# Patient Record
Sex: Female | Born: 1975 | Race: Black or African American | Hispanic: No | Marital: Single | Smoking: Current every day smoker
Health system: Southern US, Community
[De-identification: ages and names within clinical notes are randomized; demographics above are authoritative.]

## PROBLEM LIST (undated history)

## (undated) ENCOUNTER — Inpatient Hospital Stay (HOSPITAL_COMMUNITY): Payer: Self-pay

## (undated) DIAGNOSIS — I10 Essential (primary) hypertension: Secondary | ICD-10-CM

## (undated) DIAGNOSIS — G43019 Migraine without aura, intractable, without status migrainosus: Secondary | ICD-10-CM

## (undated) DIAGNOSIS — D573 Sickle-cell trait: Secondary | ICD-10-CM

## (undated) DIAGNOSIS — D649 Anemia, unspecified: Secondary | ICD-10-CM

## (undated) DIAGNOSIS — O039 Complete or unspecified spontaneous abortion without complication: Secondary | ICD-10-CM

## (undated) DIAGNOSIS — K219 Gastro-esophageal reflux disease without esophagitis: Secondary | ICD-10-CM

## (undated) DIAGNOSIS — I509 Heart failure, unspecified: Secondary | ICD-10-CM

## (undated) HISTORY — DX: Migraine without aura, intractable, without status migrainosus: G43.019

## (undated) HISTORY — PX: DILATION AND CURETTAGE OF UTERUS: SHX78

## (undated) HISTORY — PX: BREAST SURGERY: SHX581

## (undated) HISTORY — DX: Sickle-cell trait: D57.3

---

## 1997-10-26 ENCOUNTER — Emergency Department (HOSPITAL_COMMUNITY): Admission: EM | Admit: 1997-10-26 | Discharge: 1997-10-26 | Payer: Self-pay | Admitting: Emergency Medicine

## 1997-11-02 ENCOUNTER — Inpatient Hospital Stay (HOSPITAL_COMMUNITY): Admission: EM | Admit: 1997-11-02 | Discharge: 1997-11-02 | Payer: Self-pay | Admitting: Emergency Medicine

## 1997-11-09 ENCOUNTER — Ambulatory Visit (HOSPITAL_COMMUNITY): Admission: RE | Admit: 1997-11-09 | Discharge: 1997-11-09 | Payer: Self-pay | Admitting: *Deleted

## 1997-11-10 ENCOUNTER — Ambulatory Visit (HOSPITAL_COMMUNITY): Admission: RE | Admit: 1997-11-10 | Discharge: 1997-11-10 | Payer: Self-pay | Admitting: Obstetrics

## 1997-11-12 ENCOUNTER — Ambulatory Visit (HOSPITAL_COMMUNITY): Admission: RE | Admit: 1997-11-12 | Discharge: 1997-11-12 | Payer: Self-pay | Admitting: *Deleted

## 1997-11-16 ENCOUNTER — Ambulatory Visit (HOSPITAL_COMMUNITY): Admission: AD | Admit: 1997-11-16 | Discharge: 1997-11-16 | Payer: Self-pay | Admitting: *Deleted

## 2000-02-15 ENCOUNTER — Ambulatory Visit (HOSPITAL_COMMUNITY): Admission: RE | Admit: 2000-02-15 | Discharge: 2000-02-15 | Payer: Self-pay | Admitting: *Deleted

## 2000-06-04 ENCOUNTER — Ambulatory Visit (HOSPITAL_COMMUNITY): Admission: RE | Admit: 2000-06-04 | Discharge: 2000-06-04 | Payer: Self-pay | Admitting: *Deleted

## 2000-06-17 ENCOUNTER — Inpatient Hospital Stay (HOSPITAL_COMMUNITY): Admission: AD | Admit: 2000-06-17 | Discharge: 2000-06-17 | Payer: Self-pay | Admitting: *Deleted

## 2000-06-25 ENCOUNTER — Ambulatory Visit (HOSPITAL_COMMUNITY): Admission: RE | Admit: 2000-06-25 | Discharge: 2000-06-25 | Payer: Self-pay | Admitting: Obstetrics

## 2000-07-16 ENCOUNTER — Inpatient Hospital Stay (HOSPITAL_COMMUNITY): Admission: AD | Admit: 2000-07-16 | Discharge: 2000-07-19 | Payer: Self-pay | Admitting: *Deleted

## 2000-07-16 ENCOUNTER — Inpatient Hospital Stay (HOSPITAL_COMMUNITY): Admission: AD | Admit: 2000-07-16 | Discharge: 2000-07-16 | Payer: Self-pay | Admitting: Obstetrics & Gynecology

## 2001-01-08 ENCOUNTER — Encounter: Admission: RE | Admit: 2001-01-08 | Discharge: 2001-01-08 | Payer: Self-pay | Admitting: General Surgery

## 2001-01-08 ENCOUNTER — Encounter (HOSPITAL_BASED_OUTPATIENT_CLINIC_OR_DEPARTMENT_OTHER): Payer: Self-pay | Admitting: General Surgery

## 2001-04-14 ENCOUNTER — Emergency Department (HOSPITAL_COMMUNITY): Admission: EM | Admit: 2001-04-14 | Discharge: 2001-04-14 | Payer: Self-pay | Admitting: *Deleted

## 2001-04-14 ENCOUNTER — Encounter: Payer: Self-pay | Admitting: Emergency Medicine

## 2003-09-11 ENCOUNTER — Emergency Department (HOSPITAL_COMMUNITY): Admission: EM | Admit: 2003-09-11 | Discharge: 2003-09-11 | Payer: Self-pay | Admitting: Emergency Medicine

## 2005-02-02 ENCOUNTER — Emergency Department (HOSPITAL_COMMUNITY): Admission: EM | Admit: 2005-02-02 | Discharge: 2005-02-02 | Payer: Self-pay | Admitting: Emergency Medicine

## 2005-03-10 ENCOUNTER — Emergency Department (HOSPITAL_COMMUNITY): Admission: EM | Admit: 2005-03-10 | Discharge: 2005-03-10 | Payer: Self-pay | Admitting: Emergency Medicine

## 2005-05-18 ENCOUNTER — Encounter: Admission: RE | Admit: 2005-05-18 | Discharge: 2005-05-18 | Payer: Self-pay | Admitting: Obstetrics and Gynecology

## 2008-11-10 ENCOUNTER — Inpatient Hospital Stay (HOSPITAL_COMMUNITY): Admission: EM | Admit: 2008-11-10 | Discharge: 2008-11-11 | Payer: Self-pay | Admitting: Emergency Medicine

## 2009-08-29 ENCOUNTER — Emergency Department (HOSPITAL_COMMUNITY): Admission: EM | Admit: 2009-08-29 | Discharge: 2009-08-30 | Payer: Self-pay | Admitting: Emergency Medicine

## 2010-01-10 ENCOUNTER — Emergency Department (HOSPITAL_COMMUNITY): Admission: EM | Admit: 2010-01-10 | Discharge: 2010-01-10 | Payer: Self-pay | Admitting: Emergency Medicine

## 2010-02-07 ENCOUNTER — Inpatient Hospital Stay (HOSPITAL_COMMUNITY): Admission: AD | Admit: 2010-02-07 | Discharge: 2010-02-07 | Payer: Self-pay | Admitting: Obstetrics & Gynecology

## 2010-02-07 ENCOUNTER — Ambulatory Visit: Payer: Self-pay | Admitting: Family

## 2010-09-16 LAB — RAPID URINE DRUG SCREEN, HOSP PERFORMED
Amphetamines: NOT DETECTED
Barbiturates: NOT DETECTED
Benzodiazepines: NOT DETECTED
Cocaine: NOT DETECTED
Opiates: NOT DETECTED
Tetrahydrocannabinol: POSITIVE — AB

## 2010-09-16 LAB — CBC
HCT: 33.6 % — ABNORMAL LOW (ref 36.0–46.0)
Hemoglobin: 11.4 g/dL — ABNORMAL LOW (ref 12.0–15.0)
MCH: 30.3 pg (ref 26.0–34.0)
MCHC: 34 g/dL (ref 30.0–36.0)
MCV: 89.3 fL (ref 78.0–100.0)
Platelets: 194 10*3/uL (ref 150–400)
RBC: 3.77 MIL/uL — ABNORMAL LOW (ref 3.87–5.11)
RDW: 14.6 % (ref 11.5–15.5)
WBC: 8.3 10*3/uL (ref 4.0–10.5)

## 2010-09-16 LAB — URINE MICROSCOPIC-ADD ON

## 2010-09-16 LAB — URINALYSIS, ROUTINE W REFLEX MICROSCOPIC
Bilirubin Urine: NEGATIVE
Glucose, UA: NEGATIVE mg/dL
Ketones, ur: NEGATIVE mg/dL
Leukocytes, UA: NEGATIVE
Nitrite: NEGATIVE
Protein, ur: NEGATIVE mg/dL
Specific Gravity, Urine: 1.01 (ref 1.005–1.030)
Urobilinogen, UA: 0.2 mg/dL (ref 0.0–1.0)
pH: 6 (ref 5.0–8.0)

## 2010-09-16 LAB — WET PREP, GENITAL
Trich, Wet Prep: NONE SEEN
Yeast Wet Prep HPF POC: NONE SEEN

## 2010-09-16 LAB — GC/CHLAMYDIA PROBE AMP, GENITAL
Chlamydia, DNA Probe: NEGATIVE
GC Probe Amp, Genital: NEGATIVE

## 2010-09-18 LAB — URINALYSIS, ROUTINE W REFLEX MICROSCOPIC
Bilirubin Urine: NEGATIVE
Glucose, UA: NEGATIVE mg/dL
Hgb urine dipstick: NEGATIVE
Ketones, ur: NEGATIVE mg/dL
Nitrite: NEGATIVE
Protein, ur: NEGATIVE mg/dL
Specific Gravity, Urine: 1.02 (ref 1.005–1.030)
Urobilinogen, UA: 0.2 mg/dL (ref 0.0–1.0)
pH: 6 (ref 5.0–8.0)

## 2010-09-18 LAB — URINE MICROSCOPIC-ADD ON

## 2010-09-18 LAB — DIFFERENTIAL
Basophils Absolute: 0 10*3/uL (ref 0.0–0.1)
Basophils Relative: 0 % (ref 0–1)
Eosinophils Absolute: 0.1 10*3/uL (ref 0.0–0.7)
Eosinophils Relative: 1 % (ref 0–5)
Lymphocytes Relative: 23 % (ref 12–46)
Lymphs Abs: 1.9 10*3/uL (ref 0.7–4.0)
Monocytes Absolute: 0.1 10*3/uL (ref 0.1–1.0)
Monocytes Relative: 1 % — ABNORMAL LOW (ref 3–12)
Neutro Abs: 6.3 10*3/uL (ref 1.7–7.7)
Neutrophils Relative %: 75 % (ref 43–77)

## 2010-09-18 LAB — CBC
HCT: 36 % (ref 36.0–46.0)
Hemoglobin: 12.4 g/dL (ref 12.0–15.0)
MCH: 29.9 pg (ref 26.0–34.0)
MCHC: 34.5 g/dL (ref 30.0–36.0)
MCV: 86.8 fL (ref 78.0–100.0)
Platelets: 261 10*3/uL (ref 150–400)
RBC: 4.15 MIL/uL (ref 3.87–5.11)
RDW: 14.1 % (ref 11.5–15.5)
WBC: 8.5 10*3/uL (ref 4.0–10.5)

## 2010-09-18 LAB — WET PREP, GENITAL: Yeast Wet Prep HPF POC: NONE SEEN

## 2010-09-18 LAB — URINE CULTURE
Colony Count: NO GROWTH
Culture: NO GROWTH

## 2010-09-18 LAB — ABO/RH: ABO/RH(D): O POS

## 2010-09-18 LAB — HCG, QUANTITATIVE, PREGNANCY: hCG, Beta Chain, Quant, S: 58933 m[IU]/mL — ABNORMAL HIGH (ref <5)

## 2010-09-18 LAB — GC/CHLAMYDIA PROBE AMP, GENITAL
Chlamydia, DNA Probe: NEGATIVE
GC Probe Amp, Genital: NEGATIVE

## 2010-09-18 LAB — POCT PREGNANCY, URINE: Preg Test, Ur: POSITIVE

## 2010-09-21 LAB — WET PREP, GENITAL
Clue Cells Wet Prep HPF POC: NONE SEEN
WBC, Wet Prep HPF POC: NONE SEEN
Yeast Wet Prep HPF POC: NONE SEEN

## 2010-09-21 LAB — COMPREHENSIVE METABOLIC PANEL
ALT: 17 U/L (ref 0–35)
AST: 22 U/L (ref 0–37)
Albumin: 3.3 g/dL — ABNORMAL LOW (ref 3.5–5.2)
Alkaline Phosphatase: 62 U/L (ref 39–117)
BUN: 16 mg/dL (ref 6–23)
CO2: 25 mEq/L (ref 19–32)
Calcium: 8.1 mg/dL — ABNORMAL LOW (ref 8.4–10.5)
Chloride: 110 mEq/L (ref 96–112)
Creatinine, Ser: 0.69 mg/dL (ref 0.4–1.2)
GFR calc Af Amer: >60 mL/min (ref >60)
GFR calc non Af Amer: >60 mL/min (ref >60)
Glucose, Bld: 109 mg/dL — ABNORMAL HIGH (ref 70–99)
Potassium: 3.8 mEq/L (ref 3.5–5.1)
Sodium: 141 mEq/L (ref 135–145)
Total Bilirubin: 0.4 mg/dL (ref 0.3–1.2)
Total Protein: 6 g/dL (ref 6.0–8.3)

## 2010-09-21 LAB — RAPID URINE DRUG SCREEN, HOSP PERFORMED
Amphetamines: NOT DETECTED
Barbiturates: NOT DETECTED
Benzodiazepines: NOT DETECTED
Cocaine: NOT DETECTED
Opiates: NOT DETECTED
Tetrahydrocannabinol: POSITIVE — AB

## 2010-09-21 LAB — URINALYSIS, ROUTINE W REFLEX MICROSCOPIC
Bilirubin Urine: NEGATIVE
Glucose, UA: NEGATIVE mg/dL
Ketones, ur: 15 mg/dL — AB
Nitrite: NEGATIVE
Protein, ur: NEGATIVE mg/dL
Specific Gravity, Urine: 1.022 (ref 1.005–1.030)
Urobilinogen, UA: 1 mg/dL (ref 0.0–1.0)
pH: 6 (ref 5.0–8.0)

## 2010-09-21 LAB — URINE MICROSCOPIC-ADD ON

## 2010-09-21 LAB — CBC
HCT: 36.5 % (ref 36.0–46.0)
Hemoglobin: 12.4 g/dL (ref 12.0–15.0)
MCHC: 33.9 g/dL (ref 30.0–36.0)
MCV: 90.7 fL (ref 78.0–100.0)
Platelets: 209 10*3/uL (ref 150–400)
RBC: 4.03 MIL/uL (ref 3.87–5.11)
RDW: 13.8 % (ref 11.5–15.5)
WBC: 7.2 10*3/uL (ref 4.0–10.5)

## 2010-09-21 LAB — ETHANOL: Alcohol, Ethyl (B): <5 mg/dL (ref 0–10)

## 2010-09-21 LAB — GC/CHLAMYDIA PROBE AMP, GENITAL
Chlamydia, DNA Probe: NEGATIVE
GC Probe Amp, Genital: NEGATIVE

## 2010-09-21 LAB — LIPASE, BLOOD: Lipase: 28 U/L (ref 11–59)

## 2010-09-21 LAB — DIFFERENTIAL
Basophils Absolute: 0 10*3/uL (ref 0.0–0.1)
Basophils Relative: 1 % (ref 0–1)
Eosinophils Absolute: 0.2 10*3/uL (ref 0.0–0.7)
Eosinophils Relative: 2 % (ref 0–5)
Lymphocytes Relative: 40 % (ref 12–46)
Lymphs Abs: 2.8 10*3/uL (ref 0.7–4.0)
Monocytes Absolute: 0.3 10*3/uL (ref 0.1–1.0)
Monocytes Relative: 4 % (ref 3–12)
Neutro Abs: 3.8 10*3/uL (ref 1.7–7.7)
Neutrophils Relative %: 54 % (ref 43–77)

## 2010-09-21 LAB — URINE CULTURE
Colony Count: NO GROWTH
Culture: NO GROWTH

## 2010-09-21 LAB — POCT PREGNANCY, URINE: Preg Test, Ur: NEGATIVE

## 2010-10-11 LAB — COMPREHENSIVE METABOLIC PANEL
ALT: 17 U/L (ref 0–35)
ALT: 21 U/L (ref 0–35)
AST: 22 U/L (ref 0–37)
AST: 31 U/L (ref 0–37)
Albumin: 3.1 g/dL — ABNORMAL LOW (ref 3.5–5.2)
Albumin: 3.9 g/dL (ref 3.5–5.2)
Alkaline Phosphatase: 59 U/L (ref 39–117)
Alkaline Phosphatase: 67 U/L (ref 39–117)
BUN: 3 mg/dL — ABNORMAL LOW (ref 6–23)
BUN: 5 mg/dL — ABNORMAL LOW (ref 6–23)
CO2: 22 mEq/L (ref 19–32)
CO2: 26 mEq/L (ref 19–32)
Calcium: 7.8 mg/dL — ABNORMAL LOW (ref 8.4–10.5)
Calcium: 9.5 mg/dL (ref 8.4–10.5)
Chloride: 105 mEq/L (ref 96–112)
Chloride: 114 mEq/L — ABNORMAL HIGH (ref 96–112)
Creatinine, Ser: 0.59 mg/dL (ref 0.4–1.2)
Creatinine, Ser: 0.72 mg/dL (ref 0.4–1.2)
GFR calc Af Amer: >60 mL/min (ref >60)
GFR calc Af Amer: >60 mL/min (ref >60)
GFR calc non Af Amer: >60 mL/min (ref >60)
GFR calc non Af Amer: >60 mL/min (ref >60)
Glucose, Bld: 73 mg/dL (ref 70–99)
Glucose, Bld: 89 mg/dL (ref 70–99)
Potassium: 3.9 mEq/L (ref 3.5–5.1)
Potassium: 4.4 mEq/L (ref 3.5–5.1)
Sodium: 138 mEq/L (ref 135–145)
Sodium: 142 mEq/L (ref 135–145)
Total Bilirubin: 0.6 mg/dL (ref 0.3–1.2)
Total Bilirubin: 0.9 mg/dL (ref 0.3–1.2)
Total Protein: 5.3 g/dL — ABNORMAL LOW (ref 6.0–8.3)
Total Protein: 6.6 g/dL (ref 6.0–8.3)

## 2010-10-11 LAB — URINE MICROSCOPIC-ADD ON

## 2010-10-11 LAB — DIFFERENTIAL
Basophils Absolute: 0 10*3/uL (ref 0.0–0.1)
Basophils Relative: 0 % (ref 0–1)
Eosinophils Absolute: 0.1 10*3/uL (ref 0.0–0.7)
Eosinophils Relative: 1 % (ref 0–5)
Lymphocytes Relative: 31 % (ref 12–46)
Lymphs Abs: 3.3 10*3/uL (ref 0.7–4.0)
Monocytes Absolute: 0.6 10*3/uL (ref 0.1–1.0)
Monocytes Relative: 5 % (ref 3–12)
Neutro Abs: 6.8 10*3/uL (ref 1.7–7.7)
Neutrophils Relative %: 63 % (ref 43–77)

## 2010-10-11 LAB — ETHANOL
Alcohol, Ethyl (B): 126 mg/dL — ABNORMAL HIGH (ref 0–10)
Alcohol, Ethyl (B): 323 mg/dL — ABNORMAL HIGH (ref 0–10)

## 2010-10-11 LAB — CBC
HCT: 33.1 % — ABNORMAL LOW (ref 36.0–46.0)
HCT: 39.2 % (ref 36.0–46.0)
HCT: 43.6 % (ref 36.0–46.0)
Hemoglobin: 11.6 g/dL — ABNORMAL LOW (ref 12.0–15.0)
Hemoglobin: 13.5 g/dL (ref 12.0–15.0)
Hemoglobin: 15.1 g/dL — ABNORMAL HIGH (ref 12.0–15.0)
MCHC: 34.4 g/dL (ref 30.0–36.0)
MCHC: 34.7 g/dL (ref 30.0–36.0)
MCHC: 34.9 g/dL (ref 30.0–36.0)
MCV: 88.4 fL (ref 78.0–100.0)
MCV: 90.1 fL (ref 78.0–100.0)
MCV: 90.2 fL (ref 78.0–100.0)
Platelets: 183 10*3/uL (ref 150–400)
Platelets: 206 10*3/uL (ref 150–400)
Platelets: 247 10*3/uL (ref 150–400)
RBC: 3.75 MIL/uL — ABNORMAL LOW (ref 3.87–5.11)
RBC: 4.34 MIL/uL (ref 3.87–5.11)
RBC: 4.84 MIL/uL (ref 3.87–5.11)
RDW: 14.1 % (ref 11.5–15.5)
RDW: 14.4 % (ref 11.5–15.5)
RDW: 14.6 % (ref 11.5–15.5)
WBC: 10.8 10*3/uL — ABNORMAL HIGH (ref 4.0–10.5)
WBC: 6.9 10*3/uL (ref 4.0–10.5)
WBC: 7.2 10*3/uL (ref 4.0–10.5)

## 2010-10-11 LAB — RAPID URINE DRUG SCREEN, HOSP PERFORMED
Amphetamines: NOT DETECTED
Barbiturates: NOT DETECTED
Benzodiazepines: NOT DETECTED
Cocaine: NOT DETECTED
Opiates: NOT DETECTED
Tetrahydrocannabinol: POSITIVE — AB

## 2010-10-11 LAB — URINALYSIS, ROUTINE W REFLEX MICROSCOPIC
Bilirubin Urine: NEGATIVE
Glucose, UA: NEGATIVE mg/dL
Hgb urine dipstick: NEGATIVE
Ketones, ur: NEGATIVE mg/dL
Nitrite: NEGATIVE
Protein, ur: NEGATIVE mg/dL
Specific Gravity, Urine: 1.003 — ABNORMAL LOW (ref 1.005–1.030)
Urobilinogen, UA: 0.2 mg/dL (ref 0.0–1.0)
pH: 6 (ref 5.0–8.0)

## 2010-10-11 LAB — BASIC METABOLIC PANEL
BUN: 6 mg/dL (ref 6–23)
CO2: 22 mEq/L (ref 19–32)
Calcium: 9.6 mg/dL (ref 8.4–10.5)
Chloride: 109 mEq/L (ref 96–112)
Creatinine, Ser: 0.59 mg/dL (ref 0.4–1.2)
GFR calc Af Amer: >60 mL/min (ref >60)
GFR calc non Af Amer: >60 mL/min (ref >60)
Glucose, Bld: 95 mg/dL (ref 70–99)
Potassium: 3.1 mEq/L — ABNORMAL LOW (ref 3.5–5.1)
Sodium: 142 mEq/L (ref 135–145)

## 2010-10-11 LAB — MAGNESIUM: Magnesium: 2.3 mg/dL (ref 1.5–2.5)

## 2010-10-11 LAB — HCG, QUANTITATIVE, PREGNANCY
hCG, Beta Chain, Quant, S: 384 m[IU]/mL — ABNORMAL HIGH (ref <5)
hCG, Beta Chain, Quant, S: 523 m[IU]/mL — ABNORMAL HIGH (ref <5)

## 2010-10-11 LAB — PREGNANCY, URINE: Preg Test, Ur: POSITIVE

## 2010-11-15 NOTE — H&P (Signed)
NAMEVANILLA, HEATHERINGTON               ACCOUNT NO.:  [id number]   MEDICAL RECORD NO.:  [id number]          PATIENT TYPE:  INP   LOCATION:  1517                         FACILITY:  [facility]   PHYSICIAN:  Oswald Hillock, MD        DATE OF BIRTH:  [birth year]   DATE OF ADMISSION:  11/09/2008  DATE OF DISCHARGE:                              HISTORY & PHYSICAL   CHIEF COMPLAINT:  Altered mental status.   HISTORY OF PRESENT ILLNESS:  The patient is a 35 year old African  American female who presented to the ER via EMS with acute alcohol  intoxication.  The patient was initially unable to provide any history  but, at that the time of this interview, did state that she has been  drinking on a regular daily basis and smokes weed in addition to that.  She was currently hallucinating upon arrival and received a dose of  Ativan by the ER physician.  Subsequent evaluation revealed that the  patient had a positive pregnancy test and was noted to have an alcohol  level of 323 in addition to being hypokalemic.   PAST MEDICAL HISTORY:  1. Alcohol abuse.  2. Polysubstance abuse.  3. History of first trimester abortion.   PAST SURGICAL HISTORY:  None.   CURRENT MEDICATIONS:  None.   ALLERGIES:  No known drug allergies.   FAMILY HISTORY:  No history of hypertension, coronary artery disease or  diabetes in the family.   SOCIAL HISTORY:  The patient drinks alcohol daily, smokes weed in  addition to cigarettes about a pack per day.  Denies any other drug use.  No history of IV drug abuse.   REVIEW OF SYSTEMS:  Difficult to obtain given the patient's altered  status; however, she denies any recent history of fever, rigors, chills,  chest pain, shortness of breath or any urinary symptoms.  She states  that her last menstrual cycle was about three weeks back.  She is  sexually active and does not use any contraception.   PHYSICAL EXAMINATION:  VITALS:  On admission pulse 118, blood pressure  162/16,  respiratory rate of 22, temperature 99.2, O2 saturations of 98%  on room air.  GENERAL:  The patient is somnolent, arousable after being stimulated  multiple times, alert, oriented to person and place but not time.  HEENT:  No scleral icterus.  No pallor.  Ears:  Negative.  Poor oral  dental hygiene.  NECK:  Supple.  No lymphadenopathy.  No JVD.  No carotid bruits.  CHEST:  CTA bilaterally.  CVS:  S1, S2 plus.  Regular, tachycardic.  No gallop, rub or murmur  appreciated.  ABDOMEN:  Soft, nontender, nondistended, bowel sounds present.  EXTREMITIES:  No cyanosis, clubbing or edema.  NEUROLOGIC:  Cranial nerves II-XII grossly intact.  The patient moves  all four extremities.   LABORATORY DATA:  CT scan of the head revealed no acute infiltrate or  abnormality.  Chest x-ray showed low lung volumes.  Sodium 142,  potassium 2.1, chloride 109, CO2 22, glucose 95, BUN 6, creatinine 0.59,  calcium 9.6.  WBC count  10.8, hemoglobin 15.1, hematocrit 42.6, platelet  count 247, MCV 90.  Her alcohol level was 323.  Urinalysis showed small  leukocyte esterase, magnesium level was 2.3.  Urine pregnancy was  positive.   IMPRESSION AND PLAN:  This is the case of a 35 year old [place] American  female who presents with acute alcohol intoxication who has a positive  urine pregnancy test.  1. Alcohol intoxication.  We will admit her for overnight observation.      The patient has already received thiamine and folate in the ER.      continue that on a daily basis.  She will be put on Ativan for deep      venous thrombosis prophylaxis as well as for seizures but use will      be restricted to evaluation by the MD prior to administering given      risk of fetal toxicity with benzodiazepine use.  2. Positive pregnancy test.  The patient's last menstrual cycle was      about three weeks back.  She is sexually active and does not use      any contraception based on her account at the time of this       interview.  We will get the serum pregnancy test to confirm her      pregnancy and may need to consult OB/GYN prior to her discharge.  3. Hypokalemia.  We will supplement with KCl and IV fluids. We will      monitor.  4. Deep venous thrombosis prophylaxis.  No indication for deep venous      thrombosis prophylaxis at  this time.      Oswald Hillock, MD  Electronically Signed     BA/MEDQ  D:  11/10/2008  T:  11/10/2008  Job:  [id number]   cc:   [facility]

## 2010-11-15 NOTE — Discharge Summary (Signed)
NAMECARLETA, [name]               ACCOUNT NO.:  [id number]   MEDICAL RECORD NO.:  [id number]          PATIENT TYPE:  INP   LOCATION:  1517                         FACILITY:  [facility]   PHYSICIAN:  Hillery Aldo, M.D.   DATE OF BIRTH:  [birth year]   DATE OF ADMISSION:  11/09/2008  DATE OF DISCHARGE:  11/11/2008                               DISCHARGE SUMMARY   PRIMARY CARE PHYSICIAN:  None.   DISCHARGE DIAGNOSES:  1. Toxic encephalopathy secondary to alcohol intoxication.  2. Alcohol dependency.  3. Hypertension.  4. Abdominal pain likely due to a right ovarian cyst.  5. 3-5 weeks pregnancy.  6. Marijuana abuse   DISCHARGE MEDICATIONS:  1. Hydralazine 10 mg p.o. q.i.d.  2. Prenatal vitamin.   CONSULTATIONS:  Dr. Jeanie Sewer of psychiatry.   BRIEF ADMISSION HISTORY OF PRESENT ILLNESS:  The patient is a 35-year-  old female who presented to the hospital acutely intoxicated with  alcohol.  The patient apparently was significantly altered and  hallucinating upon arrival.  She had a CAT scan of her head that was  reportedly normal and a chest x-ray, and subsequently was noted to be  pregnant.  The patient did not have any former knowledge that she was  pregnant nor had she had been receiving any regular medical care.  She  was admitted for further evaluation and detoxification.  For full  details, please see the dictated report done by Dr. Arley Phenix.   PROCEDURES AND DIAGNOSTIC STUDIES:  1. Chest x-ray on Nov 09, 2008 showed low lung volumes with no acute      findings.  2. Chest x-ray on Nov 09, 2008 showed no acute intracranial      abnormality.  3. OB ultrasound on October 12, 2008 showed an early intrauterine      pregnancy estimated a 5 weeks 1 day by gestational sac size.  No      visible embryo.  Probable involuted right ovarian cyst.  No other      acute or significant findings.   DISCHARGE LABORATORY VALUES:  Beta HCG was 523.  Sodium was 138,  potassium 4.4, chloride 105,  bicarb 26, BUN 5, creatinine 0.72, glucose  89, calcium 9.5.  Liver function studies were within normal limits.  White blood cell count was 6.9, hemoglobin 13.5, hematocrit 39.2,  platelets 206,000.   HOSPITAL COURSE BY PROBLEM:  1. Toxic encephalopathy secondary to alcohol intoxication/alcohol      dependency:  The patient was admitted and provided with Ativan on      p.r.n. basis.  She was supplemented with thiamine and folic acid      and counseled on the importance of avoiding alcohol, especially      during pregnancy.  The patient seemed motivated to pursue help for      her substance abuse issues.  At this point, she is awaiting      psychiatric consultation and will be discharged once cleared by      psychiatry.  She is no longer exhibiting any hallucinations or      other signs of altered mental status.  2. Hypertension:  The patient was put on hydralazine.  She will be      discharged on hydralazine as this is safe to use during pregnancy.      She will need close followup.  3. Abdominal pain secondary to a right ovarian cyst:  The patient did      have an extensive evaluation.  An ultrasound was obtained which      showed the cyst but no other source of her abdominal pain.      Abdominal pain has improved.  4. A 5-week pregnancy:  The patient has a 5-week 1-day pregnancy by      ultrasound criteria.  She was put on a prenatal vitamin.  We will      have social work attempt to get her hospital followup with a local      OB/GYN.  5. Marijuana Abuse: The patient was counseled.   DISPOSITION:  The patient is medically stable for discharge.  We are  awaiting formal psychiatry consultation and social work help for  discharge planning.   Time spent coordinating care for discharge and discharge instructions  equals 35 minutes.      Hillery Aldo, M.D.  Electronically Signed     CR/MEDQ  D:  11/11/2008  T:  11/11/2008  Job:  [id number]

## 2010-11-15 NOTE — Consult Note (Signed)
NAMEKALSEY, LULL               ACCOUNT NO.:  [id number]   MEDICAL RECORD NO.:  [id number]          PATIENT TYPE:  INP   LOCATION:  1517                         FACILITY:  [facility]   PHYSICIAN:  Antonietta Breach, M.D.  DATE OF BIRTH:  1975/09/20   DATE OF CONSULTATION:  11/11/2008  DATE OF DISCHARGE:                                 CONSULTATION   REASON FOR CONSULTATION:  Alcohol dependence, rule out depression in a  young pregnant female.   HISTORY OF PRESENT ILLNESS:  Rylynne Schicker is a 35 year old female  admitted to the [facility] on the Nov 09, 2008, for alcohol  intoxication in the context of pregnancy.   Ms. Kelner discovered that she was pregnant upon her presentation to the  [facility].  It is estimated that she is at 3-[redacted] weeks  gestation.   She states that during her heaviest periods of alcohol use, she has been  consuming up to a fifth per day.  She states that over the past week,  she has not been drinking every day.   She has normal interests and constructive future goals.  She has been  processing the news of her pregnancy.  She has been talking with her  close friends.  She very much wants to keep the baby.  She acknowledges  that she is still recovering from the surprise but is determined to do  everything that is necessary to be a good mother for the baby   The father of the baby has gotten the news.  She realizes that he is  ambivalent about having any role in support and she is approaching the  future as if he will provide no support.   She already has a single parent of an 3-year-old and she anticipates  continuing to be a single parent.   She does have a church support and she has 2 very close supportive  friends.   Regarding her alcohol use, she is determined to maintain sobriety and  wants standard tools to help her maintain sobriety.   When she initially arrived to the [facility], she was  hallucinating.  She did receive  some Ativan in the emergency room.  All  hallucinations have resolved.  Her orientation and memory function are  intact.  She is cooperative and socially appropriate.   PAST PSYCHIATRIC HISTORY:  No history of major depression.  No history  of elevated energy or decreased need for sleep.  No suicide attempts.  She denies she denies any history of mental treatment.  She does  acknowledge that she has an alcohol problem.   FAMILY PSYCHIATRIC HISTORY:  None known.   SOCIAL HISTORY:  Please see the above.  She does have a history of  smoking marijuana.  She has been smoking tobacco cigarettes at a rate of  about a pack per day.   PAST MEDICAL HISTORY:  Estimated [redacted] weeks gestation pregnancy.  Her beta  hCG quantitative was 523.   SGOT 31, SGPT 21.  Her alcohol level upon presentation to the emergency  room was 323.  The  admitting physician noted that she was somnolent but  arousable at the time of the exam.   MEDICATIONS:  She is on folic acid 1 mg daily and thiamine 100 mg daily.   ALLERGIES:  None known.  mental status exam she is not displaying a  tremor.   REVIEW OF SYSTEMS:  Noncontributory.   PHYSICAL EXAMINATION:  VITAL SIGNS:  Temperature 98.5, pulse 90,  respiratory rate 20, blood pressure 143/91, O2 saturation on room air  98%.   MENTAL STATUS EXAM:  She is not displaying a tremor.  Ms. Zaugg is  alert.  Her eye contact is good.  Her affect is broad and appropriate.  Mood is within normal limits.  Concentration within normal limits.  She  is oriented to all spheres.  Her memory is intact to immediate, recent  and remote.  Her fund of knowledge and intelligence are normal.  Speech  is normal.  Thought process logical, coherent, goal-directed.  No  looseness of associations.  Thought content:  No thoughts of harming  herself or others. No delusions or hallucinations.  Insight is intact.  Judgment is intact.   ASSESSMENT:  Axis I:  Alcohol dependence.  Cannabis abuse.   Axis II:  Deferred.  Axis III:  Early pregnancy.  Axis IV:  Primary support group.  Axis V:  55.   Ms. Sayed is not at risk to harm herself or others.  She agrees to call  emergency services immediately for any thoughts of harming herself,  thoughts of harming others or distress.   The undersigned provided ego supportive psychotherapy.   The 12-step method was discussed.  Also [facility] was discussed.   Ms. Andujo wants to attend [facility] with an appointment and discuss  further [facility] treatment from there.   She wants to attend 12-step groups and have 12-step materials.   She states that she does not want to attend a residential chemical  dependence program at this time, although the undersigned adamantly  recommends that.   She explains that she needs to be home with her other child.   Would ask the social worker to help Ms. Blanchette set of a [facility]  appointment as well as provide 12-step materials and information on  groups.      Antonietta Breach, M.D.  Electronically Signed     JW/MEDQ  D:  11/11/2008  T:  11/11/2008  Job:  [id number]

## 2011-02-07 ENCOUNTER — Emergency Department (HOSPITAL_COMMUNITY)
Admission: EM | Admit: 2011-02-07 | Discharge: 2011-02-07 | Disposition: A | Discharge status: Home / Self Care | Payer: Self-pay | Attending: Emergency Medicine | Admitting: Emergency Medicine

## 2011-02-07 DIAGNOSIS — T63461A Toxic effect of venom of wasps, accidental (unintentional), initial encounter: Secondary | ICD-10-CM | POA: Insufficient documentation

## 2011-02-07 DIAGNOSIS — T6391XA Toxic effect of contact with unspecified venomous animal, accidental (unintentional), initial encounter: Secondary | ICD-10-CM | POA: Insufficient documentation

## 2011-03-11 ENCOUNTER — Emergency Department (HOSPITAL_COMMUNITY)
Admission: EM | Admit: 2011-03-11 | Discharge: 2011-03-11 | Disposition: A | Discharge status: Home / Self Care | Payer: Self-pay | Attending: Emergency Medicine | Admitting: Emergency Medicine

## 2011-03-11 ENCOUNTER — Emergency Department (HOSPITAL_COMMUNITY): Payer: Self-pay

## 2011-03-11 DIAGNOSIS — K219 Gastro-esophageal reflux disease without esophagitis: Secondary | ICD-10-CM | POA: Insufficient documentation

## 2011-03-11 DIAGNOSIS — I10 Essential (primary) hypertension: Secondary | ICD-10-CM | POA: Insufficient documentation

## 2011-03-11 DIAGNOSIS — R079 Chest pain, unspecified: Secondary | ICD-10-CM | POA: Insufficient documentation

## 2011-03-11 DIAGNOSIS — L509 Urticaria, unspecified: Secondary | ICD-10-CM | POA: Insufficient documentation

## 2011-03-11 LAB — CBC
HCT: 37.1 % (ref 36.0–46.0)
Hemoglobin: 13.1 g/dL (ref 12.0–15.0)
MCH: 29 pg (ref 26.0–34.0)
MCHC: 35.3 g/dL (ref 30.0–36.0)
MCV: 82.1 fL (ref 78.0–100.0)
Platelets: 192 10*3/uL (ref 150–400)
RBC: 4.52 MIL/uL (ref 3.87–5.11)
RDW: 13.8 % (ref 11.5–15.5)
WBC: 6.4 10*3/uL (ref 4.0–10.5)

## 2011-03-11 LAB — COMPREHENSIVE METABOLIC PANEL
ALT: 13 U/L (ref 0–35)
AST: 16 U/L (ref 0–37)
Albumin: 3.8 g/dL (ref 3.5–5.2)
Alkaline Phosphatase: 60 U/L (ref 39–117)
BUN: 11 mg/dL (ref 6–23)
CO2: 21 mEq/L (ref 19–32)
Calcium: 9.6 mg/dL (ref 8.4–10.5)
Chloride: 107 mEq/L (ref 96–112)
Creatinine, Ser: 0.67 mg/dL (ref 0.50–1.10)
GFR calc Af Amer: >60 mL/min (ref >60)
GFR calc non Af Amer: >60 mL/min (ref >60)
Glucose, Bld: 96 mg/dL (ref 70–99)
Potassium: 3.8 mEq/L (ref 3.5–5.1)
Sodium: 139 mEq/L (ref 135–145)
Total Bilirubin: 0.4 mg/dL (ref 0.3–1.2)
Total Protein: 6.9 g/dL (ref 6.0–8.3)

## 2011-03-11 LAB — URINE MICROSCOPIC-ADD ON

## 2011-03-11 LAB — URINALYSIS, ROUTINE W REFLEX MICROSCOPIC
Bilirubin Urine: NEGATIVE
Glucose, UA: NEGATIVE mg/dL
Hgb urine dipstick: NEGATIVE
Ketones, ur: NEGATIVE mg/dL
Nitrite: NEGATIVE
Protein, ur: NEGATIVE mg/dL
Specific Gravity, Urine: 1.009 (ref 1.005–1.030)
Urobilinogen, UA: 0.2 mg/dL (ref 0.0–1.0)
pH: 8 (ref 5.0–8.0)

## 2011-03-11 LAB — DIFFERENTIAL
Basophils Absolute: 0 10*3/uL (ref 0.0–0.1)
Basophils Relative: 1 % (ref 0–1)
Eosinophils Absolute: 0.1 10*3/uL (ref 0.0–0.7)
Eosinophils Relative: 1 % (ref 0–5)
Lymphocytes Relative: 28 % (ref 12–46)
Lymphs Abs: 1.8 10*3/uL (ref 0.7–4.0)
Monocytes Absolute: 0.6 10*3/uL (ref 0.1–1.0)
Monocytes Relative: 9 % (ref 3–12)
Neutro Abs: 4 10*3/uL (ref 1.7–7.7)
Neutrophils Relative %: 62 % (ref 43–77)

## 2011-03-11 LAB — POCT I-STAT TROPONIN I: Troponin i, poc: 0 ng/mL (ref 0.00–0.08)

## 2011-03-11 LAB — POCT PREGNANCY, URINE: Preg Test, Ur: NEGATIVE

## 2011-03-11 LAB — LIPASE, BLOOD: Lipase: 32 U/L (ref 11–59)

## 2012-01-13 ENCOUNTER — Inpatient Hospital Stay (HOSPITAL_COMMUNITY)
Admission: EM | Admit: 2012-01-13 | Discharge: 2012-01-13 | Disposition: A | Discharge status: Home / Self Care | Payer: Medicaid Other | Attending: Obstetrics and Gynecology | Admitting: Obstetrics and Gynecology

## 2012-01-13 ENCOUNTER — Emergency Department (HOSPITAL_COMMUNITY): Payer: Medicaid Other

## 2012-01-13 ENCOUNTER — Encounter (HOSPITAL_COMMUNITY): Payer: Self-pay | Admitting: Emergency Medicine

## 2012-01-13 DIAGNOSIS — R109 Unspecified abdominal pain: Secondary | ICD-10-CM | POA: Insufficient documentation

## 2012-01-13 DIAGNOSIS — Z3490 Encounter for supervision of normal pregnancy, unspecified, unspecified trimester: Secondary | ICD-10-CM

## 2012-01-13 DIAGNOSIS — N949 Unspecified condition associated with female genital organs and menstrual cycle: Secondary | ICD-10-CM | POA: Insufficient documentation

## 2012-01-13 DIAGNOSIS — R102 Pelvic and perineal pain: Secondary | ICD-10-CM

## 2012-01-13 DIAGNOSIS — Z331 Pregnant state, incidental: Secondary | ICD-10-CM | POA: Insufficient documentation

## 2012-01-13 DIAGNOSIS — N938 Other specified abnormal uterine and vaginal bleeding: Secondary | ICD-10-CM | POA: Insufficient documentation

## 2012-01-13 DIAGNOSIS — N939 Abnormal uterine and vaginal bleeding, unspecified: Secondary | ICD-10-CM

## 2012-01-13 HISTORY — DX: Essential (primary) hypertension: I10

## 2012-01-13 HISTORY — DX: Gastro-esophageal reflux disease without esophagitis: K21.9

## 2012-01-13 LAB — URINALYSIS, ROUTINE W REFLEX MICROSCOPIC
Bilirubin Urine: NEGATIVE
Bilirubin Urine: NEGATIVE
Glucose, UA: NEGATIVE mg/dL
Glucose, UA: NEGATIVE mg/dL
Hgb urine dipstick: NEGATIVE
Ketones, ur: NEGATIVE mg/dL
Ketones, ur: NEGATIVE mg/dL
Nitrite: NEGATIVE
Nitrite: NEGATIVE
Protein, ur: NEGATIVE mg/dL
Protein, ur: NEGATIVE mg/dL
Specific Gravity, Urine: 1.01 (ref 1.005–1.030)
Specific Gravity, Urine: 1.011 (ref 1.005–1.030)
Urobilinogen, UA: 0.2 mg/dL (ref 0.0–1.0)
Urobilinogen, UA: 0.2 mg/dL (ref 0.0–1.0)
pH: 6.5 (ref 5.0–8.0)
pH: 6 (ref 5.0–8.0)

## 2012-01-13 LAB — CBC
HCT: 35.1 % — ABNORMAL LOW (ref 36.0–46.0)
Hemoglobin: 12.3 g/dL (ref 12.0–15.0)
MCH: 28.7 pg (ref 26.0–34.0)
MCHC: 35 g/dL (ref 30.0–36.0)
MCV: 81.8 fL (ref 78.0–100.0)
Platelets: 267 10*3/uL (ref 150–400)
RBC: 4.29 MIL/uL (ref 3.87–5.11)
RDW: 14.8 % (ref 11.5–15.5)
WBC: 11.1 10*3/uL — ABNORMAL HIGH (ref 4.0–10.5)

## 2012-01-13 LAB — HCG, QUANTITATIVE, PREGNANCY: hCG, Beta Chain, Quant, S: 7601 m[IU]/mL — ABNORMAL HIGH (ref <5)

## 2012-01-13 LAB — BASIC METABOLIC PANEL
BUN: 9 mg/dL (ref 6–23)
CO2: 17 mEq/L — ABNORMAL LOW (ref 19–32)
Calcium: 9.6 mg/dL (ref 8.4–10.5)
Chloride: 100 mEq/L (ref 96–112)
Creatinine, Ser: 0.55 mg/dL (ref 0.50–1.10)
GFR calc Af Amer: >90 mL/min (ref >90)
GFR calc non Af Amer: >90 mL/min (ref >90)
Glucose, Bld: 86 mg/dL (ref 70–99)
Potassium: 3.9 mEq/L (ref 3.5–5.1)
Sodium: 134 mEq/L — ABNORMAL LOW (ref 135–145)

## 2012-01-13 LAB — URINE MICROSCOPIC-ADD ON

## 2012-01-13 LAB — RPR: RPR Ser Ql: NONREACTIVE

## 2012-01-13 LAB — WET PREP, GENITAL
Trich, Wet Prep: NONE SEEN
Yeast Wet Prep HPF POC: NONE SEEN

## 2012-01-13 LAB — POCT PREGNANCY, URINE: Preg Test, Ur: POSITIVE — AB

## 2012-01-13 LAB — HIV ANTIBODY (ROUTINE TESTING W REFLEX): HIV: NONREACTIVE

## 2012-01-13 MED ORDER — MORPHINE SULFATE 4 MG/ML IJ SOLN
4.0000 mg | Freq: Once | INTRAMUSCULAR | Status: AC
  Administered 2012-01-13: 4 mg via INTRAVENOUS
  Filled 2012-01-13: qty 1

## 2012-01-13 MED ORDER — KETOROLAC TROMETHAMINE 30 MG/ML IJ SOLN
30.0000 mg | Freq: Once | INTRAMUSCULAR | Status: AC
  Administered 2012-01-13: 30 mg via INTRAVENOUS
  Filled 2012-01-13: qty 1

## 2012-01-13 MED ORDER — OXYCODONE-ACETAMINOPHEN 5-325 MG PO TABS: 1.0000 | ORAL_TABLET | ORAL | Status: AC | PRN

## 2012-01-13 MED ORDER — ONDANSETRON HCL 4 MG/2ML IJ SOLN: 4.0000 mg | INTRAMUSCULAR | Status: DC | PRN

## 2012-01-13 MED ORDER — HYDROCHLOROTHIAZIDE 25 MG PO TABS: 25.0000 mg | ORAL_TABLET | Freq: Once | ORAL | Status: DC

## 2012-01-13 MED ORDER — HYDROMORPHONE HCL PF 1 MG/ML IJ SOLN
1.0000 mg | Freq: Once | INTRAMUSCULAR | Status: AC
  Administered 2012-01-13: 1 mg via INTRAVENOUS
  Filled 2012-01-13: qty 1

## 2012-01-13 MED ORDER — ONDANSETRON HCL 4 MG/2ML IJ SOLN
4.0000 mg | INTRAMUSCULAR | Status: AC | PRN
  Administered 2012-01-13 (×2): 4 mg via INTRAVENOUS
  Filled 2012-01-13 (×2): qty 2

## 2012-01-13 MED ORDER — HYDROMORPHONE HCL PF 1 MG/ML IJ SOLN
1.0000 mg | Freq: Once | INTRAMUSCULAR | Status: AC
  Administered 2012-01-13: 1 mg via INTRAVENOUS

## 2012-01-13 MED ORDER — HYDROCHLOROTHIAZIDE 25 MG PO TABS
25.0000 mg | ORAL_TABLET | Freq: Once | ORAL | Status: AC
  Administered 2012-01-13: 25 mg via ORAL
  Filled 2012-01-13: qty 1

## 2012-01-13 MED ORDER — LABETALOL HCL 5 MG/ML IV SOLN: 5.0000 mg | Freq: Once | INTRAVENOUS | Status: DC

## 2012-01-13 MED ORDER — HYDROMORPHONE HCL PF 1 MG/ML IJ SOLN
INTRAMUSCULAR | Status: AC
  Filled 2012-01-13: qty 1

## 2012-01-13 NOTE — Progress Notes (Signed)
Notified of B/P 196/94. B/P Rx ordered  For pt to start . Pt to f/u in office in 2 days.

## 2012-01-13 NOTE — Progress Notes (Addendum)
Pt feeling better after toradol.    BP 195/101 HR  52  A/P:  Discussed US findings w/ pt, including IUP and CLC.  Rec f/u in office Monday for rpt [place] and recheck. Plan for discharge with Rx for percocet & restart HCTZ

## 2012-01-13 NOTE — ED Provider Notes (Signed)
History     CSN: [id number]  Arrival date & time 01/13/12  6213   First MD Initiated Contact with Patient 01/13/12 1011      Chief Complaint  Patient presents with  . Abdominal Pain    (Consider location/radiation/quality/duration/timing/severity/associated sxs/prior treatment) HPI Comments: Pt is a 36 yo female G4P1 with 3 miscarries presents to the ER with the cc of abdominal pain. Onset of symptoms began 2 weeks ago, location suprapubic, severity 10/10, radiation to left lower quadrant and CVA .  Associated symptoms include vaginal bleeding. LMP onset June 24th with heavy bleeding that changed to light flow and now  Blood clots.  The patient is currently sexually active and uses condoms occasionally.  she denies fevers, night sweats, chills, nausea, vomiting, vaginal discharge, urinary frequency or dysuria, lightheadedness, syncope, shortness of breath, difficulty breathing.  Patient has no other complaints this time.   The history is provided by the patient.    Past Medical History  Diagnosis Date  . Hypertension   . GERD (gastroesophageal reflux disease)     Past Surgical History  Procedure Date  . Breast surgery   . Dilation and curettage of uterus     four miscarriages    No family history on file.  History  Substance Use Topics  . Smoking status: Current Everyday Smoker -- 0.2 packs/day  . Smokeless tobacco: Never Used  . Alcohol Use:      monthly beer and liquor    OB History    Grav Para Term Preterm Abortions TAB SAB Ect Mult Living                  Review of Systems  Constitutional: Positive for activity change. Negative for fever, chills and appetite change.  HENT: Negative for congestion.   Eyes: Negative for visual disturbance.  Respiratory: Negative for shortness of breath.   Cardiovascular: Negative for chest pain and leg swelling.  Gastrointestinal: Positive for nausea and abdominal pain. Negative for vomiting, constipation and abdominal  distention.  Genitourinary: Positive for vaginal bleeding and pelvic pain. Negative for dysuria, urgency and frequency.  Neurological: Negative for dizziness, syncope, weakness, light-headedness, numbness and headaches.  Psychiatric/Behavioral: Negative for confusion.  All other systems reviewed and are negative.    Allergies  Review of patient's allergies indicates no known allergies.  Home Medications  No current outpatient prescriptions on file.  BP 160/82  Pulse 68  Temp 98.3 F (36.8 C) (Oral)  Resp 18  SpO2 98%  LMP 12/25/2011  Physical Exam  Constitutional: She is oriented to person, place, and time. She appears well-developed and well-nourished. She appears distressed.  HENT:  Head: Normocephalic and atraumatic.  Mouth/Throat: Oropharynx is clear and moist. No oropharyngeal exudate.  Eyes: Conjunctivae and EOM are normal. Pupils are equal, round, and reactive to light. No scleral icterus.  Neck: Normal range of motion. Neck supple. No tracheal deviation present. No thyromegaly present.  Cardiovascular: Normal rate, regular rhythm, normal heart sounds and intact distal pulses.   Pulmonary/Chest: Effort normal and breath sounds normal. No stridor. No respiratory distress. She has no wheezes.  Abdominal: Soft. There is tenderness.         Bowl sounds present. Diffuse abdominal tenderness with severe localization to the suprapubic area.   Genitourinary:       Exam performed by Jaci Carrel,  exam chaperoned Date: 01/13/2012 Pelvic exam: normal external genitalia without evidence of trauma. CERVIX: Unable to visualize os d/t bleeding; vaginal discharge - bloody, Wet  prep and DNA probe for chlamydia and GC obtained.   ADNEXA: normal adnexa in size, tender bilaterally, no palpable mass    Musculoskeletal: Normal range of motion. She exhibits no edema and no tenderness.  Neurological: She is alert and oriented to person, place, and time. Coordination normal.  Skin: Skin  is warm and dry. No rash noted. She is not diaphoretic. No erythema. No pallor.  Psychiatric: She has a normal mood and affect. Her behavior is normal.    ED Course  Procedures (including critical care time)  Labs Reviewed  CBC - Abnormal; Notable for the following:    WBC 11.1 (*)     HCT 35.1 (*)     All other components within normal limits  WET PREP, GENITAL - Abnormal; Notable for the following:    Clue Cells Wet Prep HPF POC MODERATE (*)     WBC, Wet Prep HPF POC FEW (*)     All other components within normal limits  URINALYSIS, ROUTINE W REFLEX MICROSCOPIC - Abnormal; Notable for the following:    Hgb urine dipstick LARGE (*)     Leukocytes, UA SMALL (*)     All other components within normal limits  BASIC METABOLIC PANEL - Abnormal; Notable for the following:    Sodium 134 (*)     CO2 17 (*)     All other components within normal limits  POCT PREGNANCY, URINE - Abnormal; Notable for the following:    Preg Test, Ur POSITIVE (*)     All other components within normal limits  HCG, QUANTITATIVE, PREGNANCY - Abnormal; Notable for the following:    hCG, Beta Chain, Quant, S 7601 (*)     All other components within normal limits  RPR  URINE MICROSCOPIC-ADD ON  GC/CHLAMYDIA PROBE AMP, GENITAL  HIV ANTIBODY (ROUTINE TESTING)   US Ob Comp Less 14 Wks  01/13/2012  *RADIOLOGY REPORT*  Clinical Data: Left-sided pelvic pain.  3-week gestational age by LMP.  OBSTETRIC <14 WK [place] AND TRANSVAGINAL OB US  Technique:  Both transabdominal and transvaginal ultrasound examinations were performed for complete evaluation of the gestation as well as the maternal uterus, adnexal regions, and pelvic cul-de-sac.  Transvaginal technique was performed to assess early pregnancy.  Comparison:  None.  Intrauterine gestational sac:  Visualized/normal in shape. Yolk sac: Visualized Embryo: Visualized Cardiac Activity: Visualized Heart Rate: 115 bpm  CRL: 6  mm  6 w  2 d          [place] EDC: 09/15/2012  Maternal  uterus/adnexae: Small left ovarian corpus luteum cyst noted.  Normal right ovary. No adnexal mass or free fluid identified.  IMPRESSION:  1.  Single living IUP measuring 6 weeks 2 days with [place] EDC of 09/15/2012. 2.  No maternal uterine or adnexal abnormality identified.  Original Report Authenticated By: Danae Orleans, M.D.   US Ob Transvaginal  01/13/2012  *RADIOLOGY REPORT*  Clinical Data: Left-sided pelvic pain.  3-week gestational age by LMP.  OBSTETRIC <14 WK [place] AND TRANSVAGINAL OB US  Technique:  Both transabdominal and transvaginal ultrasound examinations were performed for complete evaluation of the gestation as well as the maternal uterus, adnexal regions, and pelvic cul-de-sac.  Transvaginal technique was performed to assess early pregnancy.  Comparison:  None.  Intrauterine gestational sac:  Visualized/normal in shape. Yolk sac: Visualized Embryo: Visualized Cardiac Activity: Visualized Heart Rate: 115 bpm  CRL: 6  mm  6 w  2 d  [place] EDC: 09/15/2012  Maternal uterus/adnexae: Small left ovarian corpus luteum cyst noted.  Normal right ovary. No adnexal mass or free fluid identified.  IMPRESSION:  1.  Single living IUP measuring 6 weeks 2 days with [place] EDC of 09/15/2012. 2.  No maternal uterine or adnexal abnormality identified.  Original Report Authenticated By: Danae Orleans, M.D.     No diagnosis found.  Component     Latest Ref Rng 01/10/2010  ABO/RH(D)      [facility] POS    MDM  Suprapubic abdominal pain Vaginal bleeding Pregnancy   The patient appears reasonably stabilized for transfer & admission to [facility] considering the current resources, flow, and capabilities available in the ED at this time, and I doubt any other [facility] requiring further screening and/or treatment in the ED prior to admission.   Consult: Dr. Renaldo Fiddler to accept pt at [facility], PA-C 01/13/12 1551

## 2012-01-13 NOTE — ED Notes (Signed)
[name] notified of pain and increase BP.

## 2012-01-13 NOTE — ED Notes (Signed)
[place] is with patient.

## 2012-01-13 NOTE — Progress Notes (Signed)
36 yo G5P1031 presents w/ c/o vaginal bleeding & pelvic cramping that began 2 weeks ago.  Pt notes sx have worsened over the past 3-4 days.  Denies lightheadedness or dizziness.  Pain unrelieved w/ IV narcotics at [facility].  PMHx:  HTN (no meds or medical care x 2 yrs), GERD PSHx:  D&C, benign breast bx SHx:  + tobacco, no ivdu, occ etoh ObHx:  SVD x 1, SAB x 3  AF, VSS.  BP 180s/80s-90s Gen - appears uncomfortable Abd - soft, tender BLQ, NT Ext - NT, no edema PV - cvx closed, small/moderate amt old blood in vault.  No tissue at os  [place] - + IUP w/ FHT.  Left CLC, no free fluid Hgb 12 O+ blood type  A/P:  Threatened Ab Toradol BP meds

## 2012-01-13 NOTE — ED Provider Notes (Signed)
Medical screening examination/treatment/procedure(s) were conducted as a shared visit with non-physician practitioner(s) and myself.  I personally evaluated the patient during the encounter  Doug Sou, MD 01/13/12 [phone]

## 2012-01-13 NOTE — ED Notes (Signed)
Pt place on O2 monitor. 100% on RA

## 2012-01-13 NOTE — ED Notes (Signed)
Pt states is having vaginal bleeding x 20 days, abdominal pain off and on entire time. Pain is much worse today. States bleeding is bright w/ small clots.

## 2012-01-13 NOTE — ED Provider Notes (Addendum)
Complains of low abdominal pain for 2 weeks and vaginal bleeding since 12/21/2011. Presents today as pain became worse 4 days ago pain is at lower abdomen nonradiating crampy, constant nothing makes symptoms better or worse. No treatment prior to coming here no other associated symptoms no anorexia last bowel movement 2 days ago normal last normal period 2 months ago. On exam appears uncomfortable abdomen nondistended normal bowel sounds tender at suprapubic area left lower quadrant and right lower quadrant. No guarding no rigidity.  Doug Sou, MD 01/13/12 1103  3:40 PM patient remains with severe abdominal pain despite multiple doses of narcotic pain medicine. [facility] At 3:40 PM patient's states her discomfort continues to be severe.  she is alert, Glasgow Coma Score 15 Dr. Renaldo Fiddler is accepting physician. CRITICAL CARE Performed by: Doug Sou   Total critical care time: 30 minute  Critical care time was exclusive of separately billable procedures and treating other patients.  Critical care was necessary to treat or prevent imminent or life-threatening deterioration.  Critical care was time spent personally by me on the following activities: development of treatment plan with patient and/or surrogate as well as nursing, discussions with consultants, evaluation of patient's response to treatment, examination of patient, obtaining history from patient or surrogate, ordering and performing treatments and interventions, ordering and review of laboratory studies, ordering and review of radiographic studies, pulse oximetry and re-evaluation of patient's condition.  Doug Sou, MD 01/13/12 [phone]

## 2012-01-13 NOTE — MAU Note (Signed)
Received pt from [facility] . Report given by [facility]. Pt reports pain is better since dilaudid given but rates about 6.

## 2012-01-13 NOTE — ED Notes (Signed)
MD at bedside. 

## 2012-01-13 NOTE — ED Notes (Signed)
Pt has had 4 mg of morphine and 1 mg of Dilaudid.

## 2012-01-15 LAB — GC/CHLAMYDIA PROBE AMP, GENITAL
Chlamydia, DNA Probe: NEGATIVE
GC Probe Amp, Genital: NEGATIVE

## 2012-03-01 ENCOUNTER — Inpatient Hospital Stay (HOSPITAL_COMMUNITY)
Admission: AD | Admit: 2012-03-01 | Discharge: 2012-03-01 | Disposition: A | Discharge status: Home / Self Care | Payer: Medicaid Other | Source: Ambulatory Visit | Attending: Family Medicine | Admitting: Family Medicine

## 2012-03-01 ENCOUNTER — Encounter (HOSPITAL_COMMUNITY): Payer: Self-pay

## 2012-03-01 DIAGNOSIS — O2390 Unspecified genitourinary tract infection in pregnancy, unspecified trimester: Secondary | ICD-10-CM | POA: Insufficient documentation

## 2012-03-01 DIAGNOSIS — A5901 Trichomonal vulvovaginitis: Secondary | ICD-10-CM | POA: Insufficient documentation

## 2012-03-01 DIAGNOSIS — B9689 Other specified bacterial agents as the cause of diseases classified elsewhere: Secondary | ICD-10-CM | POA: Insufficient documentation

## 2012-03-01 DIAGNOSIS — A599 Trichomoniasis, unspecified: Secondary | ICD-10-CM

## 2012-03-01 DIAGNOSIS — N760 Acute vaginitis: Secondary | ICD-10-CM | POA: Insufficient documentation

## 2012-03-01 DIAGNOSIS — O26859 Spotting complicating pregnancy, unspecified trimester: Secondary | ICD-10-CM | POA: Insufficient documentation

## 2012-03-01 DIAGNOSIS — O10019 Pre-existing essential hypertension complicating pregnancy, unspecified trimester: Secondary | ICD-10-CM | POA: Insufficient documentation

## 2012-03-01 DIAGNOSIS — O98819 Other maternal infectious and parasitic diseases complicating pregnancy, unspecified trimester: Secondary | ICD-10-CM | POA: Insufficient documentation

## 2012-03-01 DIAGNOSIS — A499 Bacterial infection, unspecified: Secondary | ICD-10-CM

## 2012-03-01 HISTORY — DX: Complete or unspecified spontaneous abortion without complication: O03.9

## 2012-03-01 HISTORY — DX: Anemia, unspecified: D64.9

## 2012-03-01 LAB — URINALYSIS, ROUTINE W REFLEX MICROSCOPIC
Bilirubin Urine: NEGATIVE
Glucose, UA: NEGATIVE mg/dL
Hgb urine dipstick: NEGATIVE
Ketones, ur: 40 mg/dL — AB
Nitrite: NEGATIVE
Protein, ur: NEGATIVE mg/dL
Specific Gravity, Urine: 1.02 (ref 1.005–1.030)
Urobilinogen, UA: 0.2 mg/dL (ref 0.0–1.0)
pH: 6 (ref 5.0–8.0)

## 2012-03-01 LAB — URINE MICROSCOPIC-ADD ON

## 2012-03-01 LAB — WET PREP, GENITAL: Yeast Wet Prep HPF POC: NONE SEEN

## 2012-03-01 MED ORDER — LABETALOL HCL 100 MG PO TABS: 200.0000 mg | ORAL_TABLET | Freq: Two times a day (BID) | ORAL | Status: DC

## 2012-03-01 MED ORDER — METRONIDAZOLE 500 MG PO TABS: 500.0000 mg | ORAL_TABLET | Freq: Two times a day (BID) | ORAL | Status: AC

## 2012-03-01 NOTE — MAU Note (Signed)
Pt states not wearing pad at present, noted spotting in restroom, however has lightened up.

## 2012-03-01 NOTE — MAU Note (Signed)
Spotting started 2 days ago, dark colored, stopped, then notes spotting with voiding. Denies abnormal vaginal d/c changes. C/O upper abdominal cramping, denies nausea. Denies uti s/s.

## 2012-03-01 NOTE — MAU Note (Signed)
Patient states she has not started prenatal care yet. Has been having a light spotting for several days, no active bleeding and not wearing a pad. States she started having lower abdominal pain this am.

## 2012-03-01 NOTE — MAU Provider Note (Signed)
History     CSN: [id number]  Arrival date and time: 03/01/12 1115   First Provider Initiated Contact with Patient 03/01/12 1253      Chief Complaint  Patient presents with  . Abdominal Pain  . Vaginal Discharge   HPI Pt is a [id number] here at 11.5 wks IUP with report of vaginal spotting x 2 days, (dark); no abdominal pain; no abnormal vaginal odor.  Seen in MAU in July 2013, screened for infections, all negative.  Pelvic ultrasound showed a 6 wk IUP.  Blood type O+  Past Medical History  Diagnosis Date  . Hypertension   . GERD (gastroesophageal reflux disease)   . Miscarriage     x3  . Anemia     Past Surgical History  Procedure Date  . Dilation and curettage of uterus     four miscarriages  . Breast surgery     cyst removal 1990    Family History  Problem Relation Age of Onset  . Other Neg Hx     History  Substance Use Topics  . Smoking status: Former Smoker -- 0.2 packs/day for 10 years    Types: Cigarettes  . Smokeless tobacco: Never Used  . Alcohol Use: No    Allergies: No Known Allergies  Prescriptions prior to admission  Medication Sig Dispense Refill  . hydrochlorothiazide (HYDRODIURIL) 25 MG tablet Take 1 tablet (25 mg total) by mouth once.  30 tablet  6    Review of Systems  Gastrointestinal: Negative for abdominal pain.  Genitourinary:       Spotting of blood  All other systems reviewed and are negative.   Physical Exam   Blood pressure 151/75, pulse 64, temperature 98.6 F (37 C), temperature source Oral, resp. rate 18, height 5' 4.5" (1.638 m), weight 50.894 kg (112 lb 3.2 oz), last menstrual period 12/25/2011, SpO2 100.00%.  Physical Exam  Constitutional: She is oriented to person, place, and time. She appears well-developed and well-nourished. No distress.  HENT:  Head: Normocephalic.  Neck: Normal range of motion. Neck supple.  Cardiovascular: Normal rate, regular rhythm and normal heart sounds.  Exam reveals no gallop and no  friction rub.   No murmur heard. Respiratory: Effort normal and breath sounds normal. No respiratory distress.  GI: She exhibits no mass. There is no tenderness. There is no rebound, no guarding and no CVA tenderness.  Genitourinary: Uterus is enlarged. Cervix exhibits no motion tenderness and no discharge. Vaginal discharge (white, creamy) found.  Musculoskeletal: Normal range of motion.  Neurological: She is alert and oriented to person, place, and time.  Skin: Skin is warm and dry.  Psychiatric: She has a normal mood and affect.    MAU Course  Procedures Results for orders placed during the hospital encounter of 03/01/12 (from the past 24 hour(s))  URINALYSIS, ROUTINE W REFLEX MICROSCOPIC     Status: Abnormal   Collection Time   03/01/12 11:59 AM      Component Value Range   Color, Urine YELLOW  YELLOW   APPearance CLEAR  CLEAR   Specific Gravity, Urine 1.020  1.005 - 1.030   pH 6.0  5.0 - 8.0   Glucose, UA NEGATIVE  NEGATIVE mg/dL   Hgb urine dipstick NEGATIVE  NEGATIVE   Bilirubin Urine NEGATIVE  NEGATIVE   Ketones, ur 40 (*) NEGATIVE mg/dL   Protein, ur NEGATIVE  NEGATIVE mg/dL   Urobilinogen, UA 0.2  0.0 - 1.0 mg/dL   Nitrite NEGATIVE  NEGATIVE   Leukocytes,  UA SMALL (*) NEGATIVE  URINE MICROSCOPIC-ADD ON     Status: Abnormal   Collection Time   03/01/12 11:59 AM      Component Value Range   Squamous Epithelial / LPF FEW (*) RARE   WBC, UA 11-20  <3 WBC/hpf   RBC / HPF 0-2  <3 RBC/hpf   Bacteria, UA FEW (*) RARE   Urine-Other TRICHOMONAS PRESENT    WET PREP, GENITAL     Status: Abnormal   Collection Time   03/01/12  1:09 PM      Component Value Range   Yeast Wet Prep HPF POC NONE SEEN  NONE SEEN   Trich, Wet Prep FEW (*) NONE SEEN   Clue Cells Wet Prep HPF POC FEW (*) NONE SEEN   WBC, Wet Prep HPF POC FEW (*) NONE SEEN     Assessment and Plan  Bacterial Vaginosis Trichomoniasis Hypertension in Pregnancy  Plan: Flagyl 500 mg BID x 7 days Discontinue HCTZ  and begin Labetalol 200 BID Keep scheduled appt with OB  [facility] 03/01/2012, 12:56 PM

## 2012-03-02 LAB — GC/CHLAMYDIA PROBE AMP, GENITAL
Chlamydia, DNA Probe: NEGATIVE
GC Probe Amp, Genital: NEGATIVE

## 2012-03-02 NOTE — MAU Provider Note (Signed)
Chart reviewed and agree with management and plan.  

## 2012-04-24 ENCOUNTER — Other Ambulatory Visit (HOSPITAL_COMMUNITY): Payer: Self-pay | Admitting: Family

## 2012-04-24 DIAGNOSIS — Z3689 Encounter for other specified antenatal screening: Secondary | ICD-10-CM

## 2012-04-24 LAB — OB RESULTS CONSOLE ABO/RH: RH Type: POSITIVE

## 2012-04-24 LAB — OB RESULTS CONSOLE HEPATITIS B SURFACE ANTIGEN: Hepatitis B Surface Ag: NEGATIVE

## 2012-04-24 LAB — OB RESULTS CONSOLE RUBELLA ANTIBODY, IGM: Rubella: IMMUNE

## 2012-04-24 LAB — OB RESULTS CONSOLE VARICELLA ZOSTER ANTIBODY, IGG: Varicella: IMMUNE

## 2012-04-24 LAB — CYSTIC FIBROSIS DIAGNOSTIC STUDY: Interpretation-CFDNA:: NEGATIVE

## 2012-04-24 LAB — CULTURE, OB URINE: Urine Culture, OB: NO GROWTH

## 2012-04-24 LAB — OB RESULTS CONSOLE ANTIBODY SCREEN: Antibody Screen: NEGATIVE

## 2012-04-24 LAB — OB RESULTS CONSOLE HIV ANTIBODY (ROUTINE TESTING): HIV: NONREACTIVE

## 2012-04-26 ENCOUNTER — Ambulatory Visit (HOSPITAL_COMMUNITY)
Admission: RE | Admit: 2012-04-26 | Discharge: 2012-04-26 | Disposition: A | Discharge status: Home / Self Care | Payer: Medicaid Other | Source: Ambulatory Visit | Attending: Family | Admitting: Family

## 2012-04-26 DIAGNOSIS — Z3689 Encounter for other specified antenatal screening: Secondary | ICD-10-CM | POA: Insufficient documentation

## 2012-04-30 DIAGNOSIS — D573 Sickle-cell trait: Secondary | ICD-10-CM | POA: Insufficient documentation

## 2012-04-30 DIAGNOSIS — O10919 Unspecified pre-existing hypertension complicating pregnancy, unspecified trimester: Secondary | ICD-10-CM

## 2012-04-30 DIAGNOSIS — O0990 Supervision of high risk pregnancy, unspecified, unspecified trimester: Secondary | ICD-10-CM

## 2012-05-02 ENCOUNTER — Ambulatory Visit (INDEPENDENT_AMBULATORY_CARE_PROVIDER_SITE_OTHER): Payer: Medicaid Other | Admitting: Obstetrics & Gynecology

## 2012-05-02 ENCOUNTER — Encounter: Payer: Self-pay | Admitting: Obstetrics & Gynecology

## 2012-05-02 VITALS — BP 126/80 | Temp 99.6°F | Ht 63.0 in | Wt 125.6 lb

## 2012-05-02 DIAGNOSIS — O0990 Supervision of high risk pregnancy, unspecified, unspecified trimester: Secondary | ICD-10-CM

## 2012-05-02 DIAGNOSIS — O10919 Unspecified pre-existing hypertension complicating pregnancy, unspecified trimester: Secondary | ICD-10-CM

## 2012-05-02 DIAGNOSIS — O10019 Pre-existing essential hypertension complicating pregnancy, unspecified trimester: Secondary | ICD-10-CM

## 2012-05-02 DIAGNOSIS — I10 Essential (primary) hypertension: Secondary | ICD-10-CM

## 2012-05-02 LAB — POCT URINALYSIS DIP (DEVICE)
Bilirubin Urine: NEGATIVE
Glucose, UA: NEGATIVE mg/dL
Hgb urine dipstick: NEGATIVE
Ketones, ur: NEGATIVE mg/dL
Leukocytes, UA: NEGATIVE
Nitrite: NEGATIVE
Protein, ur: NEGATIVE mg/dL
Specific Gravity, Urine: 1.015 (ref 1.005–1.030)
Urobilinogen, UA: 0.2 mg/dL (ref 0.0–1.0)
pH: 6 (ref 5.0–8.0)

## 2012-05-02 NOTE — Assessment & Plan Note (Signed)
Baseline 24 hour urine-- TSH--

## 2012-05-02 NOTE — Patient Instructions (Signed)
Sterilization Information, Female Female sterilization is a procedure to permanently prevent pregnancy. There are different ways to perform sterilization, but all either block or close the fallopian tubes so that your eggs cannot reach your uterus. If your egg cannot reach your uterus, sperm cannot fertilize the egg, and you cannot get pregnant.  Sterilization is performed by a surgical procedure. Sometimes these procedures are performed in a hospital while a patient is asleep. Sometimes they can be done in a clinic setting with the patient awake. The fallopian tubes can be surgically cut, tied, or sealed through a procedure called tubal ligation. The fallopian tubes can also be closed with clips or rings. Sterilization can also be done by placing a tiny coil into each fallopian tube, which causes scar tissue to grow inside the tube. The scar tissue then blocks the tubes.  Discuss sterilization with your caregiver to answer any concerns you or your partner may have. You may want to ask what type of sterilization your caregiver performs. Some caregivers may not perform all the various options. Sterilization is permanent and should only be done if you are sure you do not want children or do not want any more children. Having a sterilization reversed may not be successful.  STERILIZATION PROCEDURES  Laparoscopic sterilization. This is a surgical method performed at a time other than right after childbirth. Two incisions are made in the lower abdomen. A thin, lighted tube (laparoscope) is inserted into one of the incisions and is used to perform the procedure. The fallopian tubes are closed with a ring or a clip. An instrument that uses heat could be used to seal the tubes closed (electrocautery).   Mini-laparotomy. This is a surgical method done 1 or 2 days after giving birth. Typically, a small incision is made just below the belly button (umbilicus) and the fallopian tubes are exposed. The tubes can then be  sealed, tied, or cut.   Hysteroscopic sterilization. This is performed at a time other than right after childbirth. A tiny, spring-like coil is inserted through the cervix and uterus and placed into the fallopian tubes. The coil causes scaring and blocks the tubes. Other forms of contraception should be used for 3 months after the procedure to allow the scar tissue to form completely. Additionally, it is required hysterosalpingography be done 3 months later to ensure that the procedure was successful. Hysterosalpingography is a procedure that uses X-rays to look at your uterus and fallopian tubes after a material to make them show up better has been inserted. IS STERILIZATION SAFE? Sterilization is considered safe with very rare complications. Risks depend on the type of procedure you have. As with any surgical procedure, there are risks. Some risks of sterilization by any means include:   Bleeding.  Infection.  Reaction to anesthesia medicine.  Injury to surrounding organs. Risks specific to having hysteroscopic coils placed include:  The coils may not be placed correctly the first time.   The coils may move out of place.   The tubes may not get completely blocked after 3 months.   Injury to surrounding organs when placing the coil.  HOW EFFECTIVE IS FEMALE STERILIZATION? Sterilization is nearly 100% effective, but it can fail. Depending on the type of sterilization, the rate of failure can be as high as 3%. After hysteroscopic sterilization with placement of fallopian tube coils, you will need back-up birth control for 3 months after the procedure. Sterilization is effective for a lifetime.  BENEFITS OF STERILIZATION  It does   not affect your hormones, and therefore will not affect your menstrual periods, sexual desire, or performance.   It is effective for a lifetime.   It is safe.   You do not need to worry about getting pregnant. Keep in mind that if you had the  hysteroscopic placement procedure, you must wait 3 months after the procedure (or until your caregiver confirms) before pregnancy is not considered possible.   There are no side effects unlike other types of birth control (contraception).  DRAWBACKS OF STERILIZATION  You must be sure you do not want children or any more children. The procedure is permanent.   It does not provide protection against sexually transmitted infections (STIs).   The tubes can grow back together. If this happens, there is a risk of pregnancy. There is also an increased risk (50%) of pregnancy being an ectopic pregnancy. This is a pregnancy that happens outside of the uterus. Document Released: 12/06/2007 Document Revised: 12/19/2011 Document Reviewed: 10/05/2011 Exit[employer] Patient Information 2013 [employer]. Contraception Choices Contraception (birth control) is the use of any methods or devices to prevent pregnancy. Below are some methods to help avoid pregnancy. HORMONAL METHODS   Contraceptive implant. This is a thin, plastic tube containing progesterone hormone. It does not contain estrogen hormone. Your caregiver inserts the tube in the inner part of the upper arm. The tube can remain in place for up to 3 years. After 3 years, the implant must be removed. The implant prevents the ovaries from releasing an egg (ovulation), thickens the cervical mucus which prevents sperm from entering the uterus, and thins the lining of the inside of the uterus.  Progesterone-only injections. These injections are given every 3 months by your caregiver to prevent pregnancy. This synthetic progesterone hormone stops the ovaries from releasing eggs. It also thickens cervical mucus and changes the uterine lining. This makes it harder for sperm to survive in the uterus.  Birth control pills. These pills contain estrogen and progesterone hormone. They work by stopping the egg from forming in the ovary (ovulation). Birth control  pills are prescribed by a caregiver.Birth control pills can also be used to treat heavy periods.  Minipill. This type of birth control pill contains only the progesterone hormone. They are taken every day of each month and must be prescribed by your caregiver.  Birth control patch. The patch contains hormones similar to those in birth control pills. It must be changed once a week and is prescribed by a caregiver.  Vaginal ring. The ring contains hormones similar to those in birth control pills. It is left in the vagina for 3 weeks, removed for 1 week, and then a new one is put back in place. The patient must be comfortable inserting and removing the ring from the vagina.A caregiver's prescription is necessary.  Emergency contraception. Emergency contraceptives prevent pregnancy after unprotected sexual intercourse. This pill can be taken right after sex or up to 5 days after unprotected sex. It is most effective the sooner you take the pills after having sexual intercourse. Emergency contraceptive pills are available without a prescription. Check with your pharmacist. Do not use emergency contraception as your only form of birth control. BARRIER METHODS   Female condom. This is a thin sheath (latex or rubber) that is worn over the penis during sexual intercourse. It can be used with spermicide to increase effectiveness.  Female condom. This is a soft, loose-fitting sheath that is put into the vagina before sexual intercourse.  Diaphragm. This is   a soft, latex, dome-shaped barrier that must be fitted by a caregiver. It is inserted into the vagina, along with a spermicidal jelly. It is inserted before intercourse. The diaphragm should be left in the vagina for 6 to 8 hours after intercourse.  Cervical cap. This is a round, soft, latex or plastic cup that fits over the cervix and must be fitted by a caregiver. The cap can be left in place for up to 48 hours after intercourse.  Sponge. This is a soft,  circular piece of polyurethane foam. The sponge has spermicide in it. It is inserted into the vagina after wetting it and before sexual intercourse.  Spermicides. These are chemicals that kill or block sperm from entering the cervix and uterus. They come in the form of creams, jellies, suppositories, foam, or tablets. They do not require a prescription. They are inserted into the vagina with an applicator before having sexual intercourse. The process must be repeated every time you have sexual intercourse. INTRAUTERINE CONTRACEPTION  Intrauterine device (IUD). This is a T-shaped device that is put in a woman's uterus during a menstrual period to prevent pregnancy. There are 2 types:  Copper IUD. This type of IUD is wrapped in copper wire and is placed inside the uterus. Copper makes the uterus and fallopian tubes produce a fluid that kills sperm. It can stay in place for 10 years.  Hormone IUD. This type of IUD contains the hormone progestin (synthetic progesterone). The hormone thickens the cervical mucus and prevents sperm from entering the uterus, and it also thins the uterine lining to prevent implantation of a fertilized egg. The hormone can weaken or kill the sperm that get into the uterus. It can stay in place for 5 years. PERMANENT METHODS OF CONTRACEPTION  Female tubal ligation. This is when the woman's fallopian tubes are surgically sealed, tied, or blocked to prevent the egg from traveling to the uterus.  Female sterilization. This is when the female has the tubes that carry sperm tied off (vasectomy).This blocks sperm from entering the vagina during sexual intercourse. After the procedure, the man can still ejaculate fluid (semen). NATURAL PLANNING METHODS  Natural family planning. This is not having sexual intercourse or using a barrier method (condom, diaphragm, cervical cap) on days the woman could become pregnant.  Calendar method. This is keeping track of the length of each menstrual  cycle and identifying when you are fertile.  Ovulation method. This is avoiding sexual intercourse during ovulation.  Symptothermal method. This is avoiding sexual intercourse during ovulation, using a thermometer and ovulation symptoms.  Post-ovulation method. This is timing sexual intercourse after you have ovulated. Regardless of which type or method of contraception you choose, it is important that you use condoms to protect against the transmission of sexually transmitted diseases (STDs). Talk with your caregiver about which form of contraception is most appropriate for you. Document Released: 06/19/2005 Document Revised: 09/11/2011 Document Reviewed: 10/26/2010 Exit[employer] Patient Information 2013 [employer].  

## 2012-05-02 NOTE — Progress Notes (Signed)
BP nml on labetalol.  Needs baseline 24 hour urine, CMP, and TSH.  CBC on chart.  FOB getting tested for sickle cell trait.  Dating is by 6 week [place].  Too late for genetic screening.  Will get c/u growth [place] at 28 weeks and relook at spine then.

## 2012-05-02 NOTE — Progress Notes (Signed)
Pulse: 79

## 2012-05-02 NOTE — Progress Notes (Signed)
Nutrition note: 1st visit consult Pt has h/o high BP & is controlled by medication. Pt has gained 20.6# @ 22w, which is > expected. Pt reports eating 3 meals & 5-6 snacks/d and drinks water & milk daily and tea occ. Pt reports no N&V, has heartburn occ & is constipated occ. Pt is taking PNV. Pt reports NKFA. Pt received verbal & written education on general nutrition during pregnancy. Disc wt gain goals of 25-35# or 1#/ wk. Pt agrees to eat healthy snacks. Pt does receive WIC. F/u if referred Blondell Reveal, MS, RD, LDN

## 2012-05-21 DIAGNOSIS — O0990 Supervision of high risk pregnancy, unspecified, unspecified trimester: Secondary | ICD-10-CM

## 2012-05-23 ENCOUNTER — Ambulatory Visit (INDEPENDENT_AMBULATORY_CARE_PROVIDER_SITE_OTHER): Payer: Medicaid Other | Admitting: Obstetrics and Gynecology

## 2012-05-23 ENCOUNTER — Encounter: Payer: Self-pay | Admitting: Obstetrics and Gynecology

## 2012-05-23 VITALS — BP 155/89 | Temp 98.1°F | Wt 130.0 lb

## 2012-05-23 DIAGNOSIS — Z23 Encounter for immunization: Secondary | ICD-10-CM

## 2012-05-23 DIAGNOSIS — O0990 Supervision of high risk pregnancy, unspecified, unspecified trimester: Secondary | ICD-10-CM

## 2012-05-23 DIAGNOSIS — I10 Essential (primary) hypertension: Secondary | ICD-10-CM

## 2012-05-23 DIAGNOSIS — O10919 Unspecified pre-existing hypertension complicating pregnancy, unspecified trimester: Secondary | ICD-10-CM

## 2012-05-23 DIAGNOSIS — D573 Sickle-cell trait: Secondary | ICD-10-CM

## 2012-05-23 DIAGNOSIS — O10019 Pre-existing essential hypertension complicating pregnancy, unspecified trimester: Secondary | ICD-10-CM

## 2012-05-23 LAB — POCT URINALYSIS DIP (DEVICE)
Bilirubin Urine: NEGATIVE
Glucose, UA: NEGATIVE mg/dL
Hgb urine dipstick: NEGATIVE
Ketones, ur: NEGATIVE mg/dL
Leukocytes, UA: NEGATIVE
Nitrite: NEGATIVE
Protein, ur: NEGATIVE mg/dL
Specific Gravity, Urine: 1.015 (ref 1.005–1.030)
Urobilinogen, UA: 0.2 mg/dL (ref 0.0–1.0)
pH: 6.5 (ref 5.0–8.0)

## 2012-05-23 LAB — CBC
HCT: 30.3 % — ABNORMAL LOW (ref 36.0–46.0)
Hemoglobin: 10.1 g/dL — ABNORMAL LOW (ref 12.0–15.0)
MCH: 29.6 pg (ref 26.0–34.0)
MCHC: 33.3 g/dL (ref 30.0–36.0)
MCV: 88.9 fL (ref 78.0–100.0)
Platelets: 241 10*3/uL (ref 150–400)
RBC: 3.41 MIL/uL — ABNORMAL LOW (ref 3.87–5.11)
RDW: 14.8 % (ref 11.5–15.5)
WBC: 10.8 10*3/uL — ABNORMAL HIGH (ref 4.0–10.5)

## 2012-05-23 MED ORDER — PRENATAL VITAMINS 28-0.8 MG PO TABS: 1.0000 | ORAL_TABLET | Freq: Every day | ORAL | Status: DC

## 2012-05-23 MED ORDER — INFLUENZA VIRUS VACC SPLIT PF IM SUSP
0.5000 mL | Freq: Once | INTRAMUSCULAR | Status: AC
  Administered 2012-05-23: 0.5 mL via INTRAMUSCULAR

## 2012-05-23 MED ORDER — LABETALOL HCL 100 MG PO TABS: 200.0000 mg | ORAL_TABLET | Freq: Three times a day (TID) | ORAL | Status: DC

## 2012-05-23 NOTE — Progress Notes (Signed)
Patient doing well without complaints. Elevated BP, will increase labetalol to 200 mg TID. Patient forgot 24hr urine. Will have patient recollect and bring at next visit.

## 2012-05-23 NOTE — Progress Notes (Signed)
P=62, , c/o a little edema in feet, desires flu shot

## 2012-05-24 LAB — COMPREHENSIVE METABOLIC PANEL
ALT: 9 U/L (ref 0–35)
AST: 14 U/L (ref 0–37)
Albumin: 3.4 g/dL — ABNORMAL LOW (ref 3.5–5.2)
Alkaline Phosphatase: 58 U/L (ref 39–117)
BUN: 10 mg/dL (ref 6–23)
CO2: 24 mEq/L (ref 19–32)
Calcium: 9 mg/dL (ref 8.4–10.5)
Chloride: 107 mEq/L (ref 96–112)
Creat: 0.49 mg/dL — ABNORMAL LOW (ref 0.50–1.10)
Glucose, Bld: 76 mg/dL (ref 70–99)
Potassium: 4.3 mEq/L (ref 3.5–5.3)
Sodium: 137 mEq/L (ref 135–145)
Total Bilirubin: 0.3 mg/dL (ref 0.3–1.2)
Total Protein: 5.7 g/dL — ABNORMAL LOW (ref 6.0–8.3)

## 2012-05-24 LAB — TSH: TSH: 0.846 u[IU]/mL (ref 0.350–4.500)

## 2012-05-24 NOTE — Addendum Note (Signed)
Addended by: Franchot Mimes on: 05/24/2012 10:41 AM   Modules accepted: Orders

## 2012-05-25 LAB — CREATININE, URINE, 24 HOUR
Creatinine, 24H Ur: 1399 mg/d (ref 700–1800)
Creatinine, Urine: 82.3 mg/dL

## 2012-05-25 LAB — PROTEIN, URINE, 24 HOUR
Protein, 24H Urine: 51 mg/d (ref 50–100)
Protein, Urine: 3 mg/dL

## 2012-05-27 ENCOUNTER — Encounter: Payer: Self-pay | Admitting: Obstetrics and Gynecology

## 2012-06-03 ENCOUNTER — Encounter: Payer: Self-pay | Admitting: Obstetrics & Gynecology

## 2012-06-13 ENCOUNTER — Ambulatory Visit (INDEPENDENT_AMBULATORY_CARE_PROVIDER_SITE_OTHER): Payer: Medicaid Other | Admitting: Family

## 2012-06-13 VITALS — BP 120/76 | Wt 131.9 lb

## 2012-06-13 DIAGNOSIS — Z23 Encounter for immunization: Secondary | ICD-10-CM

## 2012-06-13 DIAGNOSIS — O10919 Unspecified pre-existing hypertension complicating pregnancy, unspecified trimester: Secondary | ICD-10-CM

## 2012-06-13 DIAGNOSIS — O10019 Pre-existing essential hypertension complicating pregnancy, unspecified trimester: Secondary | ICD-10-CM

## 2012-06-13 DIAGNOSIS — I10 Essential (primary) hypertension: Secondary | ICD-10-CM

## 2012-06-13 LAB — POCT URINALYSIS DIP (DEVICE)
Bilirubin Urine: NEGATIVE
Glucose, UA: NEGATIVE mg/dL
Hgb urine dipstick: NEGATIVE
Ketones, ur: NEGATIVE mg/dL
Leukocytes, UA: NEGATIVE
Nitrite: NEGATIVE
Protein, ur: NEGATIVE mg/dL
Specific Gravity, Urine: 1.015 (ref 1.005–1.030)
Urobilinogen, UA: 0.2 mg/dL (ref 0.0–1.0)
pH: 6 (ref 5.0–8.0)

## 2012-06-13 MED ORDER — TETANUS-DIPHTH-ACELL PERTUSSIS 5-2.5-18.5 LF-MCG/0.5 IM SUSP
0.5000 mL | Freq: Once | INTRAMUSCULAR | Status: AC
  Administered 2012-06-13: 0.5 mL via INTRAMUSCULAR

## 2012-06-13 NOTE — Progress Notes (Signed)
P=84, C/o abd tightening for 3-4 days, states felt it one to two times day

## 2012-06-13 NOTE — Addendum Note (Signed)
Addended by: Sherre Lain A on: 06/13/2012 11:29 AM   Modules accepted: Orders

## 2012-06-13 NOTE — Progress Notes (Signed)
Reports braxton hicks 1-2x/day; no bleeding or leaking of fluid; reviewed 24 hr results (normal); dTap today. 1 hr drawn.

## 2012-06-14 LAB — GLUCOSE TOLERANCE, 1 HOUR (50G) W/O FASTING: Glucose, 1 Hour GTT: 109 mg/dL (ref 70–140)

## 2012-06-17 ENCOUNTER — Encounter: Payer: Self-pay | Admitting: Family

## 2012-06-27 ENCOUNTER — Ambulatory Visit (INDEPENDENT_AMBULATORY_CARE_PROVIDER_SITE_OTHER): Payer: Medicaid Other | Admitting: Family Medicine

## 2012-06-27 VITALS — BP 138/84 | Temp 98.0°F

## 2012-06-27 DIAGNOSIS — O0990 Supervision of high risk pregnancy, unspecified, unspecified trimester: Secondary | ICD-10-CM

## 2012-06-27 DIAGNOSIS — I10 Essential (primary) hypertension: Secondary | ICD-10-CM

## 2012-06-27 DIAGNOSIS — O10919 Unspecified pre-existing hypertension complicating pregnancy, unspecified trimester: Secondary | ICD-10-CM

## 2012-06-27 DIAGNOSIS — O10019 Pre-existing essential hypertension complicating pregnancy, unspecified trimester: Secondary | ICD-10-CM

## 2012-06-27 LAB — POCT URINALYSIS DIP (DEVICE)
Bilirubin Urine: NEGATIVE
Glucose, UA: NEGATIVE mg/dL
Hgb urine dipstick: NEGATIVE
Ketones, ur: NEGATIVE mg/dL
Nitrite: NEGATIVE
Protein, ur: NEGATIVE mg/dL
Specific Gravity, Urine: 1.02 (ref 1.005–1.030)
Urobilinogen, UA: 0.2 mg/dL (ref 0.0–1.0)
pH: 5.5 (ref 5.0–8.0)

## 2012-06-27 MED ORDER — LABETALOL HCL 200 MG PO TABS: 400.0000 mg | ORAL_TABLET | Freq: Two times a day (BID) | ORAL | Status: DC

## 2012-06-27 NOTE — Patient Instructions (Signed)
Pregnancy - Third Trimester The third trimester of pregnancy (the last 3 months) is a period of the most rapid growth for you and your baby. The baby approaches a length of 20 inches and a weight of 6 to 10 pounds. The baby is adding on fat and getting ready for life outside your body. While inside, babies have periods of sleeping and waking, suck their thumbs, and hiccups. You can often feel small contractions of the uterus. This is false labor. It is also called Braxton-Hicks contractions. This is like a practice for labor. The usual problems in this stage of pregnancy include more difficulty breathing, swelling of the hands and feet from water retention, and having to urinate more often because of the uterus and baby pressing on your bladder.  PRENATAL EXAMS  Blood work may continue to be done during prenatal exams. These tests are done to check on your health and the probable health of your baby. Blood work is used to follow your blood levels (hemoglobin). Anemia (low hemoglobin) is common during pregnancy. Iron and vitamins are given to help prevent this. You may also continue to be checked for diabetes. Some of the past blood tests may be done again.  The size of the uterus is measured during each visit. This makes sure your baby is growing properly according to your pregnancy dates.  Your blood pressure is checked every prenatal visit. This is to make sure you are not getting toxemia.  Your urine is checked every prenatal visit for infection, diabetes and protein.  Your weight is checked at each visit. This is done to make sure gains are happening at the suggested rate and that you and your baby are growing normally.  Sometimes, an ultrasound is performed to confirm the position and the proper growth and development of the baby. This is a test done that bounces harmless sound waves off the baby so your caregiver can more accurately determine due dates.  Discuss the type of pain medication and  anesthesia you will have during your labor and delivery.  Discuss the possibility and anesthesia if a Cesarean Section might be necessary.  Inform your caregiver if there is any mental or physical violence at home. Sometimes, a specialized non-stress test, contraction stress test and biophysical profile are done to make sure the baby is not having a problem. Checking the amniotic fluid surrounding the baby is called an amniocentesis. The amniotic fluid is removed by sticking a needle into the belly (abdomen). This is sometimes done near the end of pregnancy if an early delivery is required. In this case, it is done to help make sure the baby's lungs are mature enough for the baby to live outside of the womb. If the lungs are not mature and it is unsafe to deliver the baby, an injection of cortisone medication is given to the mother 1 to 2 days before the delivery. This helps the baby's lungs mature and makes it safer to deliver the baby. CHANGES OCCURING IN THE THIRD TRIMESTER OF PREGNANCY Your body goes through many changes during pregnancy. They vary from person to person. Talk to your caregiver about changes you notice and are concerned about.  During the last trimester, you have probably had an increase in your appetite. It is normal to have cravings for certain foods. This varies from person to person and pregnancy to pregnancy.  You may begin to get stretch marks on your hips, abdomen, and breasts. These are normal changes in the body  during pregnancy. There are no exercises or medications to take which prevent this change.  Constipation may be treated with a stool softener or adding bulk to your diet. Drinking lots of fluids, fiber in vegetables, fruits, and whole grains are helpful.  Exercising is also helpful. If you have been very active up until your pregnancy, most of these activities can be continued during your pregnancy. If you have been less active, it is helpful to start an exercise  program such as walking. Consult your caregiver before starting exercise programs.  Avoid all smoking, alcohol, un-prescribed drugs, herbs and "street drugs" during your pregnancy. These chemicals affect the formation and growth of the baby. Avoid chemicals throughout the pregnancy to ensure the delivery of a healthy infant.  Backache, varicose veins and hemorrhoids may develop or get worse.  You will tire more easily in the third trimester, which is normal.  The baby's movements may be stronger and more often.  You may become short of breath easily.  Your belly button may stick out.  A yellow discharge may leak from your breasts called colostrum.  You may have a bloody mucus discharge. This usually occurs a few days to a week before labor begins. HOME CARE INSTRUCTIONS   Keep your caregiver's appointments. Follow your caregiver's instructions regarding medication use, exercise, and diet.  During pregnancy, you are providing food for you and your baby. Continue to eat regular, well-balanced meals. Choose foods such as meat, fish, milk and other low fat dairy products, vegetables, fruits, and whole-grain breads and cereals. Your caregiver will tell you of the ideal weight gain.  A physical sexual relationship may be continued throughout pregnancy if there are no other problems such as early (premature) leaking of amniotic fluid from the membranes, vaginal bleeding, or belly (abdominal) pain.  Exercise regularly if there are no restrictions. Check with your caregiver if you are unsure of the safety of your exercises. Greater weight gain will occur in the last 2 trimesters of pregnancy. Exercising helps:  Control your weight.  Get you in shape for labor and delivery.  You lose weight after you deliver.  Rest a lot with legs elevated, or as needed for leg cramps or low back pain.  Wear a good support or jogging bra for breast tenderness during pregnancy. This may help if worn during  sleep. Pads or tissues may be used in the bra if you are leaking colostrum.  Do not use hot tubs, steam rooms, or saunas.  Wear your seat belt when driving. This protects you and your baby if you are in an accident.  Avoid raw meat, cat litter boxes and soil used by cats. These carry germs that can cause birth defects in the baby.  It is easier to loose urine during pregnancy. Tightening up and strengthening the pelvic muscles will help with this problem. You can practice stopping your urination while you are going to the bathroom. These are the same muscles you need to strengthen. It is also the muscles you would use if you were trying to stop from passing gas. You can practice tightening these muscles up 10 times a set and repeating this about 3 times per day. Once you know what muscles to tighten up, do not perform these exercises during urination. It is more likely to cause an infection by backing up the urine.  Ask for help if you have financial, counseling or nutritional needs during pregnancy. Your caregiver will be able to offer counseling for these  needs as well as refer you for other special needs.  Make a list of emergency phone numbers and have them available.  Plan on getting help from family or friends when you go home from the hospital.  Make a trial run to the hospital.  Take prenatal classes with the father to understand, practice and ask questions about the labor and delivery.  Prepare the baby's room/nursery.  Do not travel out of the city unless it is absolutely necessary and with the advice of your caregiver.  Wear only low or no heal shoes to have better balance and prevent falling. MEDICATIONS AND DRUG USE IN PREGNANCY  Take prenatal vitamins as directed. The vitamin should contain 1 milligram of folic acid. Keep all vitamins out of reach of children. Only a couple vitamins or tablets containing iron may be fatal to a baby or young child when ingested.  Avoid use  of all medications, including herbs, over-the-counter medications, not prescribed or suggested by your caregiver. Only take over-the-counter or prescription medicines for pain, discomfort, or fever as directed by your caregiver. Do not use aspirin, ibuprofen (Motrin, Advil, Nuprin) or naproxen (Aleve) unless OK'd by your caregiver.  Let your caregiver also know about herbs you may be using.  Alcohol is related to a number of birth defects. This includes fetal alcohol syndrome. All alcohol, in any form, should be avoided completely. Smoking will cause low birth rate and premature babies.  Street/illegal drugs are very harmful to the baby. They are absolutely forbidden. A baby born to an addicted mother will be addicted at birth. The baby will go through the same withdrawal an adult does. SEEK MEDICAL CARE IF: You have any concerns or worries during your pregnancy. It is better to call with your questions if you feel they cannot wait, rather than worry about them. DECISIONS ABOUT CIRCUMCISION You may or may not know the sex of your baby. If you know your baby is a boy, it may be time to think about circumcision. Circumcision is the removal of the foreskin of the penis. This is the skin that covers the sensitive end of the penis. There is no proven medical need for this. Often this decision is made on what is popular at the time or based upon religious beliefs and social issues. You can discuss these issues with your caregiver or pediatrician. SEEK IMMEDIATE MEDICAL CARE IF:   An unexplained oral temperature above 102 F (38.9 C) develops, or as your caregiver suggests.  You have leaking of fluid from the vagina (birth canal). If leaking membranes are suspected, take your temperature and tell your caregiver of this when you call.  There is vaginal spotting, bleeding or passing clots. Tell your caregiver of the amount and how many pads are used.  You develop a bad smelling vaginal discharge with  a change in the color from clear to white.  You develop vomiting that lasts more than 24 hours.  You develop chills or fever.  You develop shortness of breath.  You develop burning on urination.  You loose more than 2 pounds of weight or gain more than 2 pounds of weight or as suggested by your caregiver.  You notice sudden swelling of your face, hands, and feet or legs.  You develop belly (abdominal) pain. Round ligament discomfort is a common non-cancerous (benign) cause of abdominal pain in pregnancy. Your caregiver still must evaluate you.  You develop a severe headache that does not go away.  You develop visual  problems, blurred or double vision.  If you have not felt your baby move for more than 1 hour. If you think the baby is not moving as much as usual, eat something with sugar in it and lie down on your left side for an hour. The baby should move at least 4 to 5 times per hour. Call right away if your baby moves less than that.  You fall, are in a car accident or any kind of trauma.  There is mental or physical violence at home. Document Released: 06/13/2001 Document Revised: 09/11/2011 Document Reviewed: 12/16/2008 [facility] Patient Information 2013 [place], [place].   Hypertension During Pregnancy Hypertension is also called high blood pressure. It can occur at any time in life and during pregnancy. When you have hypertension, there is extra pressure inside your blood vessels that carry blood from the heart to the rest of your body (arteries). Hypertension during pregnancy can cause problems for you and your baby. Your baby might not weigh as much as it should at birth or might be born early (premature). Very bad cases of hypertension during pregnancy can be life-threatening.  There are different types of hypertension during pregnancy.   Chronic hypertension. This happens when a woman has hypertension before pregnancy and it continues during pregnancy.  Gestational  hypertension. This is when hypertension develops during pregnancy.  Preeclampsia or toxemia of pregnancy. This is a very serious type of hypertension that develops only during pregnancy. It is a disease that affects the whole body (systemic) and can be very dangerous for both mother and baby.  Gestational hypertension and preeclampsia usually go away after your baby is born. Blood pressure generally stabilizes within 6 weeks. Women who have hypertension during pregnancy have a greater chance of developing hypertension later in life or with future pregnancies. UNDERSTANDING BLOOD PRESSURE Blood pressure moves blood in your body. Sometimes, the force that moves the blood becomes too strong.  A blood pressure reading is given in 2 numbers and looks like a fraction.  The top number is called the systolic pressure. When your heart beats, it forces more blood to flow through the arteries. Pressure inside the arteries goes up.  The bottom number is the diastolic pressure. Pressure goes down between beats. That is when the heart is resting.  You may have hypertension if:  Your systolic blood pressure is above 140.  Your diastolic pressure is above 90. RISK FACTORS Some factors make you more likely to develop hypertension during pregnancy. Risk factors include:  Having hypertension before pregnancy.  Having hypertension during a previous pregnancy.  Being overweight.  Being older than 40.  Being pregnant with more than 1 baby (multiples).  Having diabetes or kidney problems. SYMPTOMS Chronic and gestational hypertension may not cause symptoms. Preeclampsia has symptoms, which may include:  Increased protein in your urine. Your caregiver will check for this at every prenatal visit.  Swelling of your hands and face.  Rapid weight gain.  Headaches.  Visual changes.  Being bothered by light.  Abdominal pain, especially in the right upper area.  Chest pain.  Shortness of  breath.  Increased reflexes.  Seizures. Seizures occur with a more severe form of preeclampsia, called eclampsia. DIAGNOSIS   You may be diagnosed with hypertension during pregnancy during a regular prenatal exam. At each visit, tests may include:  Blood pressure checks.  A urine test to check for protein in your urine.  The type of hypertension you are diagnosed with depends on when you  developed it. It also depends on your specific blood pressure reading.  Developing hypertension before 20 weeks of pregnancy is consistent with chronic hypertension.  Developing hypertension after 20 weeks of pregnancy is consistent with gestational hypertension.  Hypertension with increased urinary protein is diagnosed as preeclampsia.  Blood pressure measurements that stay above 160 systolic or 110 diastolic are a sign of severe preeclampsia. TREATMENT Treatment for hypertension during pregnancy varies. Treatment depends on the type of hypertension and how serious it is.  If you take medicine for chronic hypertension, you may need to switch medicines.  Drugs called ACE inhibitors should not be taken during pregnancy.  Low-dose aspirin may be suggested for women who have risk factors for preeclampsia.  If you have gestational hypertension, you may need to take a blood pressure medicine that is safe during pregnancy. Your caregiver will recommend the appropriate medicine.  If you have severe preeclampsia, you may need to be in the hospital. Caregivers will watch you and the baby very closely. You also may need to take medicine (magnesium sulfate) to prevent seizures and lower blood pressure.  Sometimes an early delivery is needed. This may be the case if the condition worsens. It would be done to protect you and the baby. The only cure for preeclampsia is delivery. HOME CARE INSTRUCTIONS  Schedule and keep all of your regular prenatal care.  Follow your caregiver's instructions for taking  medicines. Tell your caregiver about all medicines you take. This includes over-the-counter medicines.  Eat as little salt as possible.  Get regular exercise.  Do not drink alcohol.  Do not use tobacco products.  Do not drink products with caffeine.  Lie on your left side when resting.  Tell your doctor if you have any preeclampsia symptoms. SEEK IMMEDIATE MEDICAL CARE IF:  You have severe abdominal pain.  You have sudden swelling in the hands, ankles, or face.  You gain 4 pounds (1.8 kg) or more in 1 week.  You vomit repeatedly.  You have vaginal bleeding.  You do not feel the baby moving as much.  You have a headache.  You have blurred or double vision.  You have muscle twitching or spasms.  You have shortness of breath.  You have blue fingernails and lips.  You have blood in your urine. MAKE SURE YOU:  Understand these instructions.  Will watch your condition.  Will get help right away if you are not doing well. Document Released: 03/07/2011 Document Revised: 09/11/2011 Document Reviewed: 03/07/2011 [facility] Patient Information 2013 [place], [place].

## 2012-06-27 NOTE — Progress Notes (Signed)
P=69  Some edema in feet and legs.  Patient reports feeling pain and pressure in her lower abdomen and back.

## 2012-06-27 NOTE — Progress Notes (Signed)
Occasional stomach/back pain 2-3 x a day. HA off and on - mild, no vision changes - thinks it might be BP because occurs before taking her labetalol. No proteinuria today. Repeat BP 140/86. Increase labetalol to 400 BID (from 200 TID).

## 2012-07-03 NOTE — L&D Delivery Note (Signed)
I was present for entire delivery and agree with above note. Patient admitted for induction of labor for IUGR and chronic hypertension.  Napoleon Form, MD

## 2012-07-03 NOTE — L&D Delivery Note (Signed)
Delivery Note At 10:51 PM a viable female was delivered via  (Presentation: right occiput anterior).  APGAR: , ; weight .   Placenta status: intact.  Cord:  Loose nuchal cord x1. No complications with shoulders.    Anesthesia:  epidural Episiotomy: none Lacerations: bilateral labial abrasions Suture Repair: none Est. Blood Loss (mL): 300  Mom to postpartum.  Baby to nursery-stable.  [name] 08/15/2012, 11:13 PM

## 2012-07-04 ENCOUNTER — Ambulatory Visit (HOSPITAL_COMMUNITY)
Admission: RE | Admit: 2012-07-04 | Discharge: 2012-07-04 | Disposition: A | Discharge status: Home / Self Care | Payer: Medicaid Other | Source: Ambulatory Visit | Attending: Family Medicine | Admitting: Family Medicine

## 2012-07-04 ENCOUNTER — Other Ambulatory Visit: Payer: Self-pay | Admitting: Obstetrics & Gynecology

## 2012-07-04 ENCOUNTER — Encounter: Payer: Self-pay | Admitting: Obstetrics & Gynecology

## 2012-07-04 DIAGNOSIS — Z0374 Encounter for suspected problem with fetal growth ruled out: Secondary | ICD-10-CM

## 2012-07-04 DIAGNOSIS — O10019 Pre-existing essential hypertension complicating pregnancy, unspecified trimester: Secondary | ICD-10-CM | POA: Insufficient documentation

## 2012-07-04 DIAGNOSIS — D573 Sickle-cell trait: Secondary | ICD-10-CM

## 2012-07-04 DIAGNOSIS — O2620 Pregnancy care for patient with recurrent pregnancy loss, unspecified trimester: Secondary | ICD-10-CM | POA: Insufficient documentation

## 2012-07-04 DIAGNOSIS — O0990 Supervision of high risk pregnancy, unspecified, unspecified trimester: Secondary | ICD-10-CM

## 2012-07-04 DIAGNOSIS — Z3689 Encounter for other specified antenatal screening: Secondary | ICD-10-CM | POA: Insufficient documentation

## 2012-07-04 DIAGNOSIS — O10919 Unspecified pre-existing hypertension complicating pregnancy, unspecified trimester: Secondary | ICD-10-CM

## 2012-07-04 NOTE — Progress Notes (Unsigned)
Patient ID: [name], female   DOB: 08/27/1975, 37 y.o.   MRN: [id number] Pt seen in sono today.  Noted to have significant drop in EFW from 55th%ile to 26th%ile.  Uterine artery dopplers normal.  With 8/8 BPP today.  Will begin antenatal testing.  Pts next appt is 07/11/12.  clh-S

## 2012-07-08 ENCOUNTER — Encounter: Payer: Self-pay | Admitting: *Deleted

## 2012-07-08 ENCOUNTER — Other Ambulatory Visit: Payer: Medicaid Other

## 2012-07-11 ENCOUNTER — Encounter: Payer: Medicaid Other | Admitting: Obstetrics & Gynecology

## 2012-07-11 ENCOUNTER — Encounter (HOSPITAL_COMMUNITY): Payer: Self-pay | Admitting: *Deleted

## 2012-07-11 ENCOUNTER — Ambulatory Visit (INDEPENDENT_AMBULATORY_CARE_PROVIDER_SITE_OTHER): Payer: Medicaid Other | Admitting: Family Medicine

## 2012-07-11 ENCOUNTER — Inpatient Hospital Stay (HOSPITAL_COMMUNITY)
Admission: AD | Admit: 2012-07-11 | Discharge: 2012-07-11 | Disposition: A | Discharge status: Home / Self Care | Payer: Medicaid Other | Source: Ambulatory Visit | Attending: Obstetrics & Gynecology | Admitting: Obstetrics & Gynecology

## 2012-07-11 VITALS — BP 127/73 | Wt 140.8 lb

## 2012-07-11 DIAGNOSIS — O0990 Supervision of high risk pregnancy, unspecified, unspecified trimester: Secondary | ICD-10-CM

## 2012-07-11 DIAGNOSIS — I10 Essential (primary) hypertension: Secondary | ICD-10-CM

## 2012-07-11 DIAGNOSIS — D573 Sickle-cell trait: Secondary | ICD-10-CM | POA: Insufficient documentation

## 2012-07-11 DIAGNOSIS — O10919 Unspecified pre-existing hypertension complicating pregnancy, unspecified trimester: Secondary | ICD-10-CM

## 2012-07-11 DIAGNOSIS — O365990 Maternal care for other known or suspected poor fetal growth, unspecified trimester, not applicable or unspecified: Secondary | ICD-10-CM

## 2012-07-11 DIAGNOSIS — O10019 Pre-existing essential hypertension complicating pregnancy, unspecified trimester: Secondary | ICD-10-CM

## 2012-07-11 DIAGNOSIS — O99019 Anemia complicating pregnancy, unspecified trimester: Secondary | ICD-10-CM | POA: Insufficient documentation

## 2012-07-11 DIAGNOSIS — O368390 Maternal care for abnormalities of the fetal heart rate or rhythm, unspecified trimester, not applicable or unspecified: Secondary | ICD-10-CM | POA: Insufficient documentation

## 2012-07-11 DIAGNOSIS — O288 Other abnormal findings on antenatal screening of mother: Secondary | ICD-10-CM

## 2012-07-11 LAB — POCT URINALYSIS DIP (DEVICE)
Bilirubin Urine: NEGATIVE
Glucose, UA: NEGATIVE mg/dL
Hgb urine dipstick: NEGATIVE
Ketones, ur: NEGATIVE mg/dL
Leukocytes, UA: NEGATIVE
Nitrite: NEGATIVE
Protein, ur: NEGATIVE mg/dL
Specific Gravity, Urine: 1.025 (ref 1.005–1.030)
Urobilinogen, UA: 0.2 mg/dL (ref 0.0–1.0)
pH: 6 (ref 5.0–8.0)

## 2012-07-11 MED ORDER — PANTOPRAZOLE SODIUM 20 MG PO TBEC: 20.0000 mg | DELAYED_RELEASE_TABLET | Freq: Two times a day (BID) | ORAL | Status: DC

## 2012-07-11 NOTE — MAU Provider Note (Signed)
Chief Complaint:  Non-stress Test   HPI: [name] is a 37 y.o. G5P1031 at [redacted]w[redacted]d who presents to maternity admissions after being seen in clinic earlier and found to have non reassuring FHT with previous US showing possible decrease in fetal growth.  Denies contractions, leakage of fluid or vaginal bleeding. Good fetal movement.   Denies CP, SOB, leg edema, dysuria, abdominal pain, nausea, vomiting.    Pregnancy Course:   Past Medical History: Past Medical History  Diagnosis Date  . Hypertension   . GERD (gastroesophageal reflux disease)   . Miscarriage     x3  . Anemia   . Sickle cell trait     Past obstetric history: OB History    Grav Para Term Preterm Abortions TAB SAB Ect Mult Living   5 1 1  3  3   1      # Outc Date GA Lbr Len/2nd Wgt Sex Del Anes PTL Lv   1 TRM 1/02 [redacted]w[redacted]d  6lb(2.722kg) F SVD EPI  Yes   Comments: No complications   2 SAB            3 SAB            4 SAB            5 CUR               Past Surgical History: Past Surgical History  Procedure Date  . Dilation and curettage of uterus     four miscarriages  . Breast surgery     cyst removal 1990    Family History: Family History  Problem Relation Age of Onset  . Other Neg Hx   . Hypertension Mother   . Hypertension Maternal Grandmother   . Hypertension Maternal Grandfather     Social History: History  Substance Use Topics  . Smoking status: Former Smoker -- 0.2 packs/day for 10 years    Types: Cigarettes  . Smokeless tobacco: Never Used  . Alcohol Use: No    Allergies: No Known Allergies  Meds:  Prescriptions prior to admission  Medication Sig Dispense Refill  . labetalol (NORMODYNE) 200 MG tablet Take 2 tablets (400 mg total) by mouth 2 (two) times daily.  120 tablet  2  . Prenatal Vit-Fe Fumarate-FA (PRENATAL VITAMINS) 28-0.8 MG TABS Take 1 tablet by mouth daily.  30 tablet  6    ROS: Pertinent findings in history of present illness.  Physical Exam  Blood pressure  141/73, pulse 74, temperature 97 F (36.1 C), temperature source Oral, resp. rate 20, last menstrual period 12/08/2011.  GENERAL: Well-developed, well-nourished female in no acute distress.  HEENT: normocephalic HEART: RRR, no murmurs appreciated  RESP: normal effort ABDOMEN: Soft, non-tender, gravid appropriate for gestational age EXTREMITIES: Nontender, no edema NEURO: alert and oriented SPECULUM EXAM and Cervical Exam: done in clinic, deferred  Beside [place]: Shows fetal movement with cardiac activity.     FHT:  Baseline 135 , moderate variability, accelerations present, no decelerations Contractions: None   Labs: Results for orders placed in visit on 07/11/12 (from the past 24 hour(s))  POCT URINALYSIS DIP (DEVICE)     Status: Normal   Collection Time   07/11/12 10:51 AM      Component Value Range   Glucose, UA NEGATIVE  NEGATIVE mg/dL   Bilirubin Urine NEGATIVE  NEGATIVE   Ketones, ur NEGATIVE  NEGATIVE mg/dL   Specific Gravity, Urine 1.025  1.005 - 1.030   Hgb urine dipstick NEGATIVE  NEGATIVE   pH 6.0  5.0 - 8.0   Protein, ur NEGATIVE  NEGATIVE mg/dL   Urobilinogen, UA 0.2  0.0 - 1.0 mg/dL   Nitrite NEGATIVE  NEGATIVE   Leukocytes, UA NEGATIVE  NEGATIVE    Imaging:  US Ob Follow Up  07/08/2012  OBSTETRICAL ULTRASOUND: This exam was performed within a [facility]. The OB US report was generated in the AS system, and faxed to the ordering physician.   This report is also available in [employer] and in the [employer]. See AS Obstetric US report.   US Fetal Bpp W/o Non Stress  07/08/2012  OBSTETRICAL ULTRASOUND: This exam was performed within a [facility]. The OB US report was generated in the AS system, and faxed to the ordering physician.   This report is also available in [employer] and in the [employer]. See AS Obstetric US report.   [place] Ua Cord Doppler  07/08/2012  OBSTETRICAL  ULTRASOUND: This exam was performed within a [facility]. The OB US report was generated in the AS system, and faxed to the ordering physician.   This report is also available in [employer] and in the [employer]. See AS Obstetric US report.   MAU Course: 1) Continued FHT monitoring.  If category 1 and reactive, d/c with close f/u.     Assessment: 1. Chronic hypertension complicating or reason for care during pregnancy   2. Supervision of high-risk pregnancy   3. Sickle cell trait     Plan: Will continue to monitor for at least one hour with FHT.  If Category 1 and reactive, will d/c home with information given on fetal kick monitoring and labor discussion about representation to the MAU.     Medication List     As of 07/11/2012  2:07 PM    TAKE these medications         labetalol 200 MG tablet   Commonly known as: NORMODYNE   Take 2 tablets (400 mg total) by mouth 2 (two) times daily.      Prenatal Vitamins 28-0.8 MG Tabs   Take 1 tablet by mouth daily.          Briscoe Deutscher, DO 07/11/2012 1:43 PM  I saw pt and reviewed OB Hx and EFM tracing and [place] 16/14. Perceiving FM. NST reactive. [place] 26th %ile at 29 wks, AFI 13.2, Dopplers normal.   F/U HRC with FATS and continue labetalol as directed. AVS Kick Counts

## 2012-07-11 NOTE — Progress Notes (Signed)
P = 77    Pt reports that her medicine for stomach acid is not working- gets pain at night.

## 2012-07-11 NOTE — MAU Note (Signed)
Pt sent from clinic for NST. Pt has chronic HTN and possible IUGR. No complaints at this time

## 2012-07-11 NOTE — Progress Notes (Signed)
U/S last week showed interval growth decline, EFW 26%, nml fluid and Dopplers, needs another in 2 wks. (3 wks from first) NST reviewed and reactive with prolonged variable--for prolonged monitoring in MAU. BP looks good today.

## 2012-07-14 NOTE — MAU Provider Note (Signed)
Attestation of Attending Supervision of Advanced Practitioner (CNM/NP): Evaluation and management procedures were performed by the Advanced Practitioner under my supervision and collaboration. I have reviewed the Advanced Practitioner's note and chart, and I agree with the management and plan.  Novali Vollman H. 9:09 PM   

## 2012-07-15 ENCOUNTER — Ambulatory Visit (INDEPENDENT_AMBULATORY_CARE_PROVIDER_SITE_OTHER): Payer: Medicaid Other | Admitting: *Deleted

## 2012-07-15 VITALS — BP 137/77 | Wt 142.6 lb

## 2012-07-15 DIAGNOSIS — O10019 Pre-existing essential hypertension complicating pregnancy, unspecified trimester: Secondary | ICD-10-CM

## 2012-07-15 DIAGNOSIS — O365990 Maternal care for other known or suspected poor fetal growth, unspecified trimester, not applicable or unspecified: Secondary | ICD-10-CM

## 2012-07-15 NOTE — Progress Notes (Signed)
P=88 

## 2012-07-15 NOTE — Progress Notes (Signed)
NST performed today was reviewed and was found to be reactive.  AFI 11.7 cm.  Continue recommended antenatal testing and prenatal care.

## 2012-07-18 ENCOUNTER — Ambulatory Visit (INDEPENDENT_AMBULATORY_CARE_PROVIDER_SITE_OTHER): Payer: Medicaid Other | Admitting: Obstetrics & Gynecology

## 2012-07-18 VITALS — BP 121/76 | Temp 97.0°F | Wt 145.8 lb

## 2012-07-18 DIAGNOSIS — O10919 Unspecified pre-existing hypertension complicating pregnancy, unspecified trimester: Secondary | ICD-10-CM

## 2012-07-18 DIAGNOSIS — O10019 Pre-existing essential hypertension complicating pregnancy, unspecified trimester: Secondary | ICD-10-CM

## 2012-07-18 DIAGNOSIS — I10 Essential (primary) hypertension: Secondary | ICD-10-CM

## 2012-07-18 DIAGNOSIS — O365990 Maternal care for other known or suspected poor fetal growth, unspecified trimester, not applicable or unspecified: Secondary | ICD-10-CM

## 2012-07-18 LAB — POCT URINALYSIS DIP (DEVICE)
Bilirubin Urine: NEGATIVE
Glucose, UA: NEGATIVE mg/dL
Hgb urine dipstick: NEGATIVE
Ketones, ur: NEGATIVE mg/dL
Leukocytes, UA: NEGATIVE
Nitrite: NEGATIVE
Protein, ur: NEGATIVE mg/dL
Specific Gravity, Urine: 1.02 (ref 1.005–1.030)
Urobilinogen, UA: 0.2 mg/dL (ref 0.0–1.0)
pH: 7 (ref 5.0–8.0)

## 2012-07-18 NOTE — Progress Notes (Signed)
p-80 

## 2012-07-18 NOTE — Patient Instructions (Signed)

## 2012-07-18 NOTE — Progress Notes (Signed)
[place] appt scheduled for January 30th arriving at 915am

## 2012-07-18 NOTE — Progress Notes (Signed)
NST today reactive. [place] for growth in 2 weeks

## 2012-07-22 ENCOUNTER — Ambulatory Visit (INDEPENDENT_AMBULATORY_CARE_PROVIDER_SITE_OTHER): Payer: Medicaid Other | Admitting: *Deleted

## 2012-07-22 VITALS — BP 121/70 | Wt 143.5 lb

## 2012-07-22 DIAGNOSIS — O10919 Unspecified pre-existing hypertension complicating pregnancy, unspecified trimester: Secondary | ICD-10-CM

## 2012-07-22 DIAGNOSIS — O365990 Maternal care for other known or suspected poor fetal growth, unspecified trimester, not applicable or unspecified: Secondary | ICD-10-CM

## 2012-07-22 DIAGNOSIS — I10 Essential (primary) hypertension: Secondary | ICD-10-CM

## 2012-07-22 DIAGNOSIS — O10019 Pre-existing essential hypertension complicating pregnancy, unspecified trimester: Secondary | ICD-10-CM

## 2012-07-22 NOTE — Progress Notes (Signed)
P - 81 

## 2012-07-22 NOTE — Progress Notes (Signed)
NST reviewed and reactive.  [name], M.D., FACOG    

## 2012-07-25 ENCOUNTER — Ambulatory Visit (INDEPENDENT_AMBULATORY_CARE_PROVIDER_SITE_OTHER): Payer: Medicaid Other | Admitting: Obstetrics & Gynecology

## 2012-07-25 VITALS — BP 131/81 | Temp 97.1°F | Wt 143.5 lb

## 2012-07-25 DIAGNOSIS — O10019 Pre-existing essential hypertension complicating pregnancy, unspecified trimester: Secondary | ICD-10-CM

## 2012-07-25 DIAGNOSIS — O365990 Maternal care for other known or suspected poor fetal growth, unspecified trimester, not applicable or unspecified: Secondary | ICD-10-CM

## 2012-07-25 DIAGNOSIS — I10 Essential (primary) hypertension: Secondary | ICD-10-CM

## 2012-07-25 DIAGNOSIS — O10919 Unspecified pre-existing hypertension complicating pregnancy, unspecified trimester: Secondary | ICD-10-CM

## 2012-07-25 DIAGNOSIS — Z3009 Encounter for other general counseling and advice on contraception: Secondary | ICD-10-CM

## 2012-07-25 LAB — POCT URINALYSIS DIP (DEVICE)
Bilirubin Urine: NEGATIVE
Glucose, UA: NEGATIVE mg/dL
Hgb urine dipstick: NEGATIVE
Ketones, ur: NEGATIVE mg/dL
Nitrite: NEGATIVE
Protein, ur: NEGATIVE mg/dL
Specific Gravity, Urine: 1.02 (ref 1.005–1.030)
Urobilinogen, UA: 0.2 mg/dL (ref 0.0–1.0)
pH: 7 (ref 5.0–8.0)

## 2012-07-25 NOTE — Progress Notes (Signed)
[place] growth scheduled on 1/30.

## 2012-07-25 NOTE — Progress Notes (Signed)
BP nml.  NST today reactive.  Cultures next visit (pt will be induced no later than 39 weeks)

## 2012-07-25 NOTE — Progress Notes (Signed)
p=76 

## 2012-07-25 NOTE — Patient Instructions (Signed)
Breastfeeding Deciding to breastfeed is one of the best choices you can make for you and your baby. The information that follows gives a brief overview of the benefits of breastfeeding as well as common topics surrounding breastfeeding. BENEFITS OF BREASTFEEDING For the baby  The first milk (colostrum) helps the baby's digestive system function better.   There are antibodies in the mother's milk that help the baby fight off infections.   The baby has a lower incidence of asthma, allergies, and sudden infant death syndrome (SIDS).   The nutrients in breast milk are better for the baby than infant formulas, and breast milk helps the baby's brain grow better.   Babies who breastfeed have less gas, colic, and constipation.  For the mother  Breastfeeding helps develop a very special bond between the mother and her baby.   Breastfeeding is convenient, always available at the correct temperature, and costs nothing.   Breastfeeding burns calories in the mother and helps her lose weight that was gained during pregnancy.   Breastfeeding makes the uterus contract back down to normal size faster and slows bleeding following delivery.   Breastfeeding mothers have a lower risk of developing breast cancer.  BREASTFEEDING FREQUENCY  A healthy, full-term baby may breastfeed as often as every hour or space his or her feedings to every 3 hours.   Watch your baby for signs of hunger. Nurse your baby if he or she shows signs of hunger. How often you nurse will vary from baby to baby.   Nurse as often as the baby requests, or when you feel the need to reduce the fullness of your breasts.   Awaken the baby if it has been 3 4 hours since the last feeding.   Frequent feeding will help the mother make more milk and will help prevent problems, such as sore nipples and engorgement of the breasts.  BABY'S POSITION AT THE BREAST  Whether lying down or sitting, be sure that the baby's tummy is  facing your tummy.   Support the breast with 4 fingers underneath the breast and the thumb above. Make sure your fingers are well away from the nipple and baby's mouth.   Stroke the baby's lips gently with your finger or nipple.   When the baby's mouth is open wide enough, place all of your nipple and as much of the areola as possible into your baby's mouth.   Pull the baby in close so the tip of the nose and the baby's cheeks touch the breast during the feeding.  FEEDINGS AND SUCTION  The length of each feeding varies from baby to baby and from feeding to feeding.   The baby must suck about 2 3 minutes for your milk to get to him or her. This is called a "let down." For this reason, allow the baby to feed on each breast as long as he or she wants. Your baby will end the feeding when he or she has received the right balance of nutrients.   To break the suction, put your finger into the corner of the baby's mouth and slide it between his or her gums before removing your breast from his or her mouth. This will help prevent sore nipples.  HOW TO TELL WHETHER YOUR BABY IS GETTING ENOUGH BREAST MILK. Wondering whether or not your baby is getting enough milk is a common concern among mothers. You can be assured that your baby is getting enough milk if:   Your baby is actively   sucking and you hear swallowing.   Your baby seems relaxed and satisfied after a feeding.   Your baby nurses at least 8 12 times in a 24 hour time period. Nurse your baby until he or she unlatches or falls asleep at the first breast (at least 10 20 minutes), then offer the second side.   Your baby is wetting 5 6 disposable diapers (6 8 cloth diapers) in a 24 hour period by 5 6 days of age.   Your baby is having at least 3 4 stools every 24 hours for the first 6 weeks. The stool should be soft and yellow.   Your baby should gain 4 7 ounces per week after he or she is 4 days old.   Your breasts feel softer  after nursing.  REDUCING BREAST ENGORGEMENT  In the first week after your baby is born, you may experience signs of breast engorgement. When breasts are engorged, they feel heavy, warm, full, and may be tender to the touch. You can reduce engorgement if you:   Nurse frequently, every 2 3 hours. Mothers who breastfeed early and often have fewer problems with engorgement.   Place light ice packs on your breasts for 10 20 minutes between feedings. This reduces swelling. Wrap the ice packs in a lightweight towel to protect your skin. Bags of frozen vegetables work well for this purpose.   Take a warm shower or apply warm, moist heat to your breast for 5 10 minutes just before each feeding. This increases circulation and helps the milk flow.   Gently massage your breast before and during the feeding. Using your finger tips, massage from the chest wall towards your nipple in a circular motion.   Make sure that the baby empties at least one breast at every feeding before switching sides.   Use a breast pump to empty the breasts if your baby is sleepy or not nursing well. You may also want to pump if you are returning to work oryou feel you are getting engorged.   Avoid bottle feeds, pacifiers, or supplemental feedings of water or juice in place of breastfeeding. Breast milk is all the food your baby needs. It is not necessary for your baby to have water or formula. In fact, to help your breasts make more milk, it is best not to give your baby supplemental feedings during the early weeks.   Be sure the baby is latched on and positioned properly while breastfeeding.   Wear a supportive bra, avoiding underwire styles.   Eat a balanced diet with enough fluids.   Rest often, relax, and take your prenatal vitamins to prevent fatigue, stress, and anemia.  If you follow these suggestions, your engorgement should improve in 24 48 hours. If you are still experiencing difficulty, call your  lactation consultant or caregiver.  CARING FOR YOURSELF Take care of your breasts  Bathe or shower daily.   Avoid using soap on your nipples.   Start feedings on your left breast at one feeding and on your right breast at the next feeding.   You will notice an increase in your milk supply 2 5 days after delivery. You may feel some discomfort from engorgement, which makes your breasts very firm and often tender. Engorgement "peaks" out within 24 48 hours. In the meantime, apply warm moist towels to your breasts for 5 10 minutes before feeding. Gentle massage and expression of some milk before feeding will soften your breasts, making it easier for your   baby to latch on.   Wear a well-fitting nursing bra, and air dry your nipples for a 3 4minutes after each feeding.   Only use cotton bra pads.   Only use pure lanolin on your nipples after nursing. You do not need to wash it off before feeding the baby again. Another option is to express a few drops of breast milk and gently massage it into your nipples.  Take care of yourself  Eat well-balanced meals and nutritious snacks.   Drinking milk, fruit juice, and water to satisfy your thirst (about 8 glasses a day).   Get plenty of rest.  Avoid foods that you notice affect the baby in a bad way.  SEEK MEDICAL CARE IF:   You have difficulty with breastfeeding and need help.   You have a hard, red, sore area on your breast that is accompanied by a fever.   Your baby is too sleepy to eat well or is having trouble sleeping.   Your baby is wetting less than 6 diapers a day, by 5 days of age.   Your baby's skin or white part of his or her eyes is more yellow than it was in the hospital.   You feel depressed.  Document Released: 06/19/2005 Document Revised: 12/19/2011 Document Reviewed: 09/17/2011 [employer] Patient Information 2013 [employer].  

## 2012-07-29 ENCOUNTER — Other Ambulatory Visit: Payer: Medicaid Other

## 2012-08-01 ENCOUNTER — Other Ambulatory Visit (HOSPITAL_COMMUNITY)
Admission: RE | Admit: 2012-08-01 | Discharge: 2012-08-01 | Disposition: A | Discharge status: Home / Self Care | Payer: Medicaid Other | Source: Ambulatory Visit | Attending: Obstetrics & Gynecology | Admitting: Obstetrics & Gynecology

## 2012-08-01 ENCOUNTER — Ambulatory Visit (INDEPENDENT_AMBULATORY_CARE_PROVIDER_SITE_OTHER): Payer: Medicaid Other | Admitting: Family Medicine

## 2012-08-01 ENCOUNTER — Other Ambulatory Visit: Payer: Self-pay | Admitting: Obstetrics & Gynecology

## 2012-08-01 ENCOUNTER — Ambulatory Visit (HOSPITAL_COMMUNITY)
Admission: RE | Admit: 2012-08-01 | Discharge: 2012-08-01 | Disposition: A | Discharge status: Home / Self Care | Payer: Medicaid Other | Source: Ambulatory Visit | Attending: Obstetrics & Gynecology | Admitting: Obstetrics & Gynecology

## 2012-08-01 ENCOUNTER — Other Ambulatory Visit: Payer: Medicaid Other

## 2012-08-01 VITALS — BP 148/88 | Temp 97.1°F | Wt 147.9 lb

## 2012-08-01 DIAGNOSIS — O365990 Maternal care for other known or suspected poor fetal growth, unspecified trimester, not applicable or unspecified: Secondary | ICD-10-CM

## 2012-08-01 DIAGNOSIS — O10919 Unspecified pre-existing hypertension complicating pregnancy, unspecified trimester: Secondary | ICD-10-CM

## 2012-08-01 DIAGNOSIS — O10019 Pre-existing essential hypertension complicating pregnancy, unspecified trimester: Secondary | ICD-10-CM

## 2012-08-01 DIAGNOSIS — I10 Essential (primary) hypertension: Secondary | ICD-10-CM

## 2012-08-01 DIAGNOSIS — O0990 Supervision of high risk pregnancy, unspecified, unspecified trimester: Secondary | ICD-10-CM

## 2012-08-01 DIAGNOSIS — D573 Sickle-cell trait: Secondary | ICD-10-CM

## 2012-08-01 DIAGNOSIS — Z3009 Encounter for other general counseling and advice on contraception: Secondary | ICD-10-CM

## 2012-08-01 DIAGNOSIS — O2620 Pregnancy care for patient with recurrent pregnancy loss, unspecified trimester: Secondary | ICD-10-CM | POA: Insufficient documentation

## 2012-08-01 DIAGNOSIS — Z113 Encounter for screening for infections with a predominantly sexual mode of transmission: Secondary | ICD-10-CM | POA: Insufficient documentation

## 2012-08-01 DIAGNOSIS — IMO0002 Reserved for concepts with insufficient information to code with codable children: Secondary | ICD-10-CM

## 2012-08-01 LAB — POCT URINALYSIS DIP (DEVICE)
Bilirubin Urine: NEGATIVE
Glucose, UA: NEGATIVE mg/dL
Hgb urine dipstick: NEGATIVE
Ketones, ur: NEGATIVE mg/dL
Nitrite: NEGATIVE
Protein, ur: NEGATIVE mg/dL
Specific Gravity, Urine: 1.01 (ref 1.005–1.030)
Urobilinogen, UA: 0.2 mg/dL (ref 0.0–1.0)
pH: 6 (ref 5.0–8.0)

## 2012-08-01 NOTE — Progress Notes (Signed)
p=67 

## 2012-08-01 NOTE — Progress Notes (Signed)
NST reviewed and reactive. U/S today shows EFW 3 lb 11 oz < 10%, AFI 11.95, nml dopplers After discussion with MFM, delivery at 38 wks unless worsening pre-eclampsia Labs today, weekly Dopplers and Fluid. Cultures today.

## 2012-08-04 LAB — CULTURE, BETA STREP (GROUP B ONLY)

## 2012-08-05 ENCOUNTER — Encounter: Payer: Self-pay | Admitting: Family Medicine

## 2012-08-05 ENCOUNTER — Other Ambulatory Visit: Payer: Medicaid Other

## 2012-08-06 ENCOUNTER — Other Ambulatory Visit: Payer: Self-pay | Admitting: Family Medicine

## 2012-08-06 DIAGNOSIS — O10919 Unspecified pre-existing hypertension complicating pregnancy, unspecified trimester: Secondary | ICD-10-CM

## 2012-08-08 ENCOUNTER — Ambulatory Visit (HOSPITAL_COMMUNITY)
Admission: RE | Admit: 2012-08-08 | Discharge: 2012-08-08 | Disposition: A | Discharge status: Home / Self Care | Payer: Medicaid Other | Source: Ambulatory Visit | Attending: Family Medicine | Admitting: Family Medicine

## 2012-08-08 ENCOUNTER — Other Ambulatory Visit: Payer: Self-pay | Admitting: Family Medicine

## 2012-08-08 ENCOUNTER — Encounter (HOSPITAL_COMMUNITY): Payer: Self-pay

## 2012-08-08 ENCOUNTER — Ambulatory Visit (HOSPITAL_COMMUNITY): Payer: Medicaid Other

## 2012-08-08 VITALS — BP 123/69 | HR 79 | Wt 147.0 lb

## 2012-08-08 DIAGNOSIS — O10919 Unspecified pre-existing hypertension complicating pregnancy, unspecified trimester: Secondary | ICD-10-CM

## 2012-08-08 DIAGNOSIS — D573 Sickle-cell trait: Secondary | ICD-10-CM

## 2012-08-08 DIAGNOSIS — O0990 Supervision of high risk pregnancy, unspecified, unspecified trimester: Secondary | ICD-10-CM

## 2012-08-08 DIAGNOSIS — O2620 Pregnancy care for patient with recurrent pregnancy loss, unspecified trimester: Secondary | ICD-10-CM | POA: Insufficient documentation

## 2012-08-08 DIAGNOSIS — O10019 Pre-existing essential hypertension complicating pregnancy, unspecified trimester: Secondary | ICD-10-CM | POA: Insufficient documentation

## 2012-08-08 DIAGNOSIS — Z3009 Encounter for other general counseling and advice on contraception: Secondary | ICD-10-CM

## 2012-08-09 ENCOUNTER — Other Ambulatory Visit: Payer: Self-pay | Admitting: Family Medicine

## 2012-08-09 ENCOUNTER — Encounter: Payer: Self-pay | Admitting: Family Medicine

## 2012-08-09 DIAGNOSIS — O2620 Pregnancy care for patient with recurrent pregnancy loss, unspecified trimester: Secondary | ICD-10-CM

## 2012-08-09 DIAGNOSIS — O10019 Pre-existing essential hypertension complicating pregnancy, unspecified trimester: Secondary | ICD-10-CM

## 2012-08-12 ENCOUNTER — Other Ambulatory Visit (HOSPITAL_COMMUNITY)
Admission: RE | Admit: 2012-08-12 | Discharge: 2012-08-12 | Disposition: A | Discharge status: Home / Self Care | Payer: Medicaid Other | Source: Ambulatory Visit | Attending: Obstetrics & Gynecology | Admitting: Obstetrics & Gynecology

## 2012-08-12 ENCOUNTER — Telehealth (HOSPITAL_COMMUNITY): Payer: Self-pay | Admitting: *Deleted

## 2012-08-12 ENCOUNTER — Ambulatory Visit (INDEPENDENT_AMBULATORY_CARE_PROVIDER_SITE_OTHER): Payer: Medicaid Other | Admitting: Obstetrics & Gynecology

## 2012-08-12 VITALS — BP 158/85 | Temp 97.7°F | Wt 148.0 lb

## 2012-08-12 DIAGNOSIS — Z113 Encounter for screening for infections with a predominantly sexual mode of transmission: Secondary | ICD-10-CM | POA: Insufficient documentation

## 2012-08-12 DIAGNOSIS — O10919 Unspecified pre-existing hypertension complicating pregnancy, unspecified trimester: Secondary | ICD-10-CM

## 2012-08-12 DIAGNOSIS — O10019 Pre-existing essential hypertension complicating pregnancy, unspecified trimester: Secondary | ICD-10-CM

## 2012-08-12 DIAGNOSIS — O365990 Maternal care for other known or suspected poor fetal growth, unspecified trimester, not applicable or unspecified: Secondary | ICD-10-CM

## 2012-08-12 DIAGNOSIS — I10 Essential (primary) hypertension: Secondary | ICD-10-CM

## 2012-08-12 DIAGNOSIS — O0990 Supervision of high risk pregnancy, unspecified, unspecified trimester: Secondary | ICD-10-CM

## 2012-08-12 LAB — POCT URINALYSIS DIP (DEVICE)
Bilirubin Urine: NEGATIVE
Glucose, UA: NEGATIVE mg/dL
Hgb urine dipstick: NEGATIVE
Ketones, ur: NEGATIVE mg/dL
Leukocytes, UA: NEGATIVE
Nitrite: NEGATIVE
Protein, ur: NEGATIVE mg/dL
Specific Gravity, Urine: 1.015 (ref 1.005–1.030)
Urobilinogen, UA: 0.2 mg/dL (ref 0.0–1.0)
pH: 7 (ref 5.0–8.0)

## 2012-08-12 NOTE — Telephone Encounter (Signed)
Preadmission screen  

## 2012-08-12 NOTE — Progress Notes (Signed)
NST reactive today. [place] 2/6 AFI 10 cm and growth <10%ile, for induction on 2/13 for IUGR.

## 2012-08-12 NOTE — Patient Instructions (Signed)
Labor Induction  Most women go into labor on their own between 37 and 42 weeks of the pregnancy. When this does not happen or when there is a medical need, medicine or other methods may be used to induce labor. Labor induction causes a pregnant woman's uterus to contract. It also causes the cervix to soften (ripen), open (dilate), and thin out (efface). Usually, labor is not induced before 39 weeks of the pregnancy unless there is a problem with the baby or mother. Whether your labor will be induced depends on a number of factors, including the following:  The medical condition of you and the baby.  How many weeks along you are.  The status of baby's lung maturity.  The condition of the cervix.  The position of the baby. REASONS FOR LABOR INDUCTION  The health of the baby or mother is at risk.  The pregnancy is overdue by 1 week or more.  The water breaks but labor does not start on its own.  The mother has a health condition or serious illness such as high blood pressure, infection, placental abruption, or diabetes.  The amniotic fluid amounts are low around the baby.  The baby is distressed. REASONS TO NOT INDUCE LABOR Labor induction may not be a good idea if:  It is shown that your baby does not tolerate labor.  An induction is just more convenient.  You want the baby to be born on a certain date, like a holiday.  You have had previous surgeries on your uterus, such as a myomectomy or the removal of fibroids.  Your placenta lies very low in the uterus and blocks the opening of the cervix (placenta previa).  Your baby is not in a head down position.  The umbilical cord drops down into the birth canal in front of the baby. This could cut off the baby's blood and oxygen supply.  You have had a previous cesarean delivery.  There areunusual circumstances, such as the baby being extremely premature. RISKS AND COMPLICATIONS Problems may occur in the process of induction  and plans may need to be modified as a situation unfolds. Some of the risks of induction include:  Change in fetal heart rate, such as too high, too low, or erratic.  Risk of fetal distress.  Risk of infection to mother and baby.  Increased chance of having a cesarean delivery.  The rare, but increased chance that the placenta will separate from the uterus (abruption).  Uterine rupture (very rare). When induction is needed for medical reasons, the benefits of induction may outweigh the risks. BEFORE THE PROCEDURE Your caregiver will check your cervix and the baby's position. This will help your caregiver decide if you are far enough along for an induction to work. PROCEDURE Several methods of labor induction may be used, such as:   Taking prostaglandin medicine to dilate and ripen the cervix. The medicine will also start contractions. It can be taken by mouth or by inserting a suppository into the vagina.  A thin tube (catheter) with a balloon on the end may be inserted into your vagina to dilate the cervix. Once inserted, the balloon expands with water, which causes the cervix to open.  Striping the membranes. Your caregiver inserts a finger between the cervix and membranes, which causes the cervix to be stretched and may cause the uterus to contract. This is often done during an office visit. You will be sent home to wait for the contractions to begin. You will   then come in for an induction.  Breaking the water. Your caregiver will make a hole in the amniotic sac using a small instrument. Once the amniotic sac breaks, contractions should begin. This may still take hours to see an effect.  Taking medicine to trigger or strengthen contractions. This medicine is given intravenously through a tube in your arm. All of the methods of induction, besides stripping the membranes, will be done in the hospital. Induction is done in the hospital so that you and the baby can be carefully  monitored. AFTER THE PROCEDURE Some inductions can take up to 2 or 3 days. Depending on the cervix, it usually takes less time. It takes longer when you are induced early in the pregnancy or if this is your first pregnancy. If a mother is still pregnant and the induction has been going on for 2 to 3 days, either the mother will be sent home or a cesarean delivery will be needed. Document Released: 11/08/2006 Document Revised: 09/11/2011 Document Reviewed: 04/24/2011 [employer] Patient Information 2013 [employer].  

## 2012-08-12 NOTE — Progress Notes (Signed)
Pulse 87  2nd BP: 144/90  Edema trace in ankles. C/o pain and pressure in pelvic area.

## 2012-08-12 NOTE — Progress Notes (Signed)
Pt reports H/A (pain scale 6) and blurred vision today.  IOL scheduled 08/15/12 @ 0730 per MFM Sherrie George) recommendation

## 2012-08-15 ENCOUNTER — Other Ambulatory Visit (HOSPITAL_COMMUNITY): Payer: Medicaid Other

## 2012-08-15 ENCOUNTER — Inpatient Hospital Stay (HOSPITAL_COMMUNITY)
Admission: AD | Admit: 2012-08-15 | Discharge: 2012-08-18 | DRG: 767 | Disposition: A | Discharge status: Home / Self Care | Payer: Medicaid Other | Source: Ambulatory Visit | Attending: Family Medicine | Admitting: Family Medicine

## 2012-08-15 ENCOUNTER — Encounter (HOSPITAL_COMMUNITY): Payer: Self-pay | Admitting: *Deleted

## 2012-08-15 ENCOUNTER — Inpatient Hospital Stay (HOSPITAL_COMMUNITY): Payer: Medicaid Other | Admitting: Anesthesiology

## 2012-08-15 ENCOUNTER — Inpatient Hospital Stay (HOSPITAL_COMMUNITY): Admission: RE | Admit: 2012-08-15 | Payer: Medicaid Other | Source: Ambulatory Visit

## 2012-08-15 ENCOUNTER — Encounter (HOSPITAL_COMMUNITY): Payer: Self-pay | Admitting: Anesthesiology

## 2012-08-15 DIAGNOSIS — Z3009 Encounter for other general counseling and advice on contraception: Secondary | ICD-10-CM

## 2012-08-15 DIAGNOSIS — O1002 Pre-existing essential hypertension complicating childbirth: Secondary | ICD-10-CM | POA: Diagnosis present

## 2012-08-15 DIAGNOSIS — O0990 Supervision of high risk pregnancy, unspecified, unspecified trimester: Secondary | ICD-10-CM

## 2012-08-15 DIAGNOSIS — Z302 Encounter for sterilization: Secondary | ICD-10-CM

## 2012-08-15 DIAGNOSIS — O10919 Unspecified pre-existing hypertension complicating pregnancy, unspecified trimester: Secondary | ICD-10-CM

## 2012-08-15 DIAGNOSIS — O365990 Maternal care for other known or suspected poor fetal growth, unspecified trimester, not applicable or unspecified: Secondary | ICD-10-CM

## 2012-08-15 DIAGNOSIS — D573 Sickle-cell trait: Secondary | ICD-10-CM

## 2012-08-15 LAB — CBC
HCT: 33.6 % — ABNORMAL LOW (ref 36.0–46.0)
HCT: 33.4 % — ABNORMAL LOW (ref 36.0–46.0)
Hemoglobin: 11.3 g/dL — ABNORMAL LOW (ref 12.0–15.0)
Hemoglobin: 11.3 g/dL — ABNORMAL LOW (ref 12.0–15.0)
MCH: 29.5 pg (ref 26.0–34.0)
MCH: 29.4 pg (ref 26.0–34.0)
MCHC: 33.6 g/dL (ref 30.0–36.0)
MCHC: 33.8 g/dL (ref 30.0–36.0)
MCV: 87.7 fL (ref 78.0–100.0)
MCV: 87 fL (ref 78.0–100.0)
Platelets: 196 10*3/uL (ref 150–400)
Platelets: 198 10*3/uL (ref 150–400)
RBC: 3.83 MIL/uL — ABNORMAL LOW (ref 3.87–5.11)
RBC: 3.84 MIL/uL — ABNORMAL LOW (ref 3.87–5.11)
RDW: 14.9 % (ref 11.5–15.5)
RDW: 14.5 % (ref 11.5–15.5)
WBC: 11.5 10*3/uL — ABNORMAL HIGH (ref 4.0–10.5)
WBC: 12 10*3/uL — ABNORMAL HIGH (ref 4.0–10.5)

## 2012-08-15 LAB — COMPREHENSIVE METABOLIC PANEL
ALT: 16 U/L (ref 0–35)
AST: 18 U/L (ref 0–37)
Albumin: 2.8 g/dL — ABNORMAL LOW (ref 3.5–5.2)
Alkaline Phosphatase: 123 U/L — ABNORMAL HIGH (ref 39–117)
BUN: 11 mg/dL (ref 6–23)
CO2: 22 mEq/L (ref 19–32)
Calcium: 9.3 mg/dL (ref 8.4–10.5)
Chloride: 101 mEq/L (ref 96–112)
Creatinine, Ser: 0.51 mg/dL (ref 0.50–1.10)
GFR calc Af Amer: >90 mL/min (ref >90)
GFR calc non Af Amer: >90 mL/min (ref >90)
Glucose, Bld: 75 mg/dL (ref 70–99)
Potassium: 3.9 mEq/L (ref 3.5–5.1)
Sodium: 135 mEq/L (ref 135–145)
Total Bilirubin: 0.2 mg/dL — ABNORMAL LOW (ref 0.3–1.2)
Total Protein: 6.1 g/dL (ref 6.0–8.3)

## 2012-08-15 LAB — RPR: RPR Ser Ql: NONREACTIVE

## 2012-08-15 LAB — PROTEIN / CREATININE RATIO, URINE
Creatinine, Urine: 17.19 mg/dL
Total Protein, Urine: <4 mg/dL

## 2012-08-15 LAB — OB RESULTS CONSOLE GBS: GBS: NEGATIVE

## 2012-08-15 MED ORDER — HYDRALAZINE HCL 20 MG/ML IJ SOLN
INTRAMUSCULAR | Status: AC
  Filled 2012-08-15: qty 1

## 2012-08-15 MED ORDER — ONDANSETRON HCL 4 MG/2ML IJ SOLN: 4.0000 mg | Freq: Four times a day (QID) | INTRAMUSCULAR | Status: DC | PRN

## 2012-08-15 MED ORDER — HYDRALAZINE HCL 20 MG/ML IJ SOLN
5.0000 mg | Freq: Once | INTRAMUSCULAR | Status: AC
  Administered 2012-08-15: 5 mg via INTRAVENOUS
  Filled 2012-08-15: qty 1

## 2012-08-15 MED ORDER — ACETAMINOPHEN 325 MG PO TABS: 650.0000 mg | ORAL_TABLET | ORAL | Status: DC | PRN

## 2012-08-15 MED ORDER — OXYTOCIN BOLUS FROM INFUSION
500.0000 mL | INTRAVENOUS | Status: DC
  Administered 2012-08-15: 500 mL via INTRAVENOUS

## 2012-08-15 MED ORDER — LACTATED RINGERS IV SOLN
INTRAVENOUS | Status: DC
  Administered 2012-08-15 (×3): via INTRAVENOUS

## 2012-08-15 MED ORDER — OXYTOCIN 40 UNITS IN LACTATED RINGERS INFUSION - SIMPLE MED
62.5000 mL/h | INTRAVENOUS | Status: DC
  Administered 2012-08-15: 62.5 mL/h via INTRAVENOUS
  Filled 2012-08-15: qty 1000

## 2012-08-15 MED ORDER — HYDRALAZINE HCL 20 MG/ML IJ SOLN
10.0000 mg | Freq: Once | INTRAMUSCULAR | Status: AC
  Administered 2012-08-15: 10 mg via INTRAVENOUS

## 2012-08-15 MED ORDER — LACTATED RINGERS IV SOLN
500.0000 mL | Freq: Once | INTRAVENOUS | Status: AC
  Administered 2012-08-15: 500 mL via INTRAVENOUS

## 2012-08-15 MED ORDER — OXYCODONE-ACETAMINOPHEN 5-325 MG PO TABS: 1.0000 | ORAL_TABLET | ORAL | Status: DC | PRN

## 2012-08-15 MED ORDER — EPHEDRINE 5 MG/ML INJ: 10.0000 mg | INTRAVENOUS | Status: DC | PRN

## 2012-08-15 MED ORDER — EPHEDRINE 5 MG/ML INJ
10.0000 mg | INTRAVENOUS | Status: DC | PRN
  Filled 2012-08-15: qty 4

## 2012-08-15 MED ORDER — PHENYLEPHRINE 40 MCG/ML (10ML) SYRINGE FOR IV PUSH (FOR BLOOD PRESSURE SUPPORT): 80.0000 ug | PREFILLED_SYRINGE | INTRAVENOUS | Status: DC | PRN

## 2012-08-15 MED ORDER — FENTANYL 2.5 MCG/ML BUPIVACAINE 1/10 % EPIDURAL INFUSION (WH - ANES)
INTRAMUSCULAR | Status: DC | PRN
  Administered 2012-08-15: 14 mL/h via EPIDURAL

## 2012-08-15 MED ORDER — FENTANYL 2.5 MCG/ML BUPIVACAINE 1/10 % EPIDURAL INFUSION (WH - ANES)
14.0000 mL/h | INTRAMUSCULAR | Status: DC
  Filled 2012-08-15: qty 125

## 2012-08-15 MED ORDER — LIDOCAINE HCL (PF) 1 % IJ SOLN
INTRAMUSCULAR | Status: DC | PRN
  Administered 2012-08-15: 9 mL
  Administered 2012-08-15: 8 mL

## 2012-08-15 MED ORDER — CITRIC ACID-SODIUM CITRATE 334-500 MG/5ML PO SOLN: 30.0000 mL | ORAL | Status: DC | PRN

## 2012-08-15 MED ORDER — NALBUPHINE SYRINGE 5 MG/0.5 ML
10.0000 mg | INJECTION | INTRAMUSCULAR | Status: DC | PRN
  Administered 2012-08-15 (×2): 10 mg via INTRAVENOUS
  Filled 2012-08-15 (×3): qty 1

## 2012-08-15 MED ORDER — MAGNESIUM SULFATE BOLUS VIA INFUSION
4.0000 g | Freq: Once | INTRAVENOUS | Status: AC
  Administered 2012-08-15: 4 g via INTRAVENOUS
  Filled 2012-08-15: qty 500

## 2012-08-15 MED ORDER — LIDOCAINE HCL (PF) 1 % IJ SOLN
30.0000 mL | INTRAMUSCULAR | Status: DC | PRN
  Filled 2012-08-15: qty 30

## 2012-08-15 MED ORDER — LACTATED RINGERS IV SOLN: 500.0000 mL | INTRAVENOUS | Status: DC | PRN

## 2012-08-15 MED ORDER — PHENYLEPHRINE 40 MCG/ML (10ML) SYRINGE FOR IV PUSH (FOR BLOOD PRESSURE SUPPORT)
80.0000 ug | PREFILLED_SYRINGE | INTRAVENOUS | Status: DC | PRN
  Filled 2012-08-15: qty 5

## 2012-08-15 MED ORDER — IBUPROFEN 600 MG PO TABS: 600.0000 mg | ORAL_TABLET | Freq: Four times a day (QID) | ORAL | Status: DC | PRN

## 2012-08-15 MED ORDER — LABETALOL HCL 5 MG/ML IV SOLN
20.0000 mg | INTRAVENOUS | Status: DC | PRN
  Administered 2012-08-15 (×2): 20 mg via INTRAVENOUS
  Filled 2012-08-15 (×3): qty 4

## 2012-08-15 MED ORDER — FENTANYL CITRATE 0.05 MG/ML IJ SOLN
100.0000 ug | INTRAMUSCULAR | Status: DC | PRN
  Administered 2012-08-15 (×2): 100 ug via INTRAVENOUS
  Filled 2012-08-15 (×2): qty 2

## 2012-08-15 MED ORDER — TERBUTALINE SULFATE 1 MG/ML IJ SOLN: 0.2500 mg | Freq: Once | INTRAMUSCULAR | Status: AC | PRN

## 2012-08-15 MED ORDER — HYDRALAZINE HCL 20 MG/ML IJ SOLN
10.0000 mg | Freq: Once | INTRAMUSCULAR | Status: AC
  Administered 2012-08-15: 10 mg via INTRAVENOUS
  Filled 2012-08-15: qty 1

## 2012-08-15 MED ORDER — MISOPROSTOL 25 MCG QUARTER TABLET
25.0000 ug | ORAL_TABLET | ORAL | Status: DC | PRN
  Administered 2012-08-15 (×2): 25 ug via VAGINAL
  Filled 2012-08-15 (×3): qty 0.25

## 2012-08-15 MED ORDER — MAGNESIUM SULFATE 40 G IN LACTATED RINGERS - SIMPLE
2.0000 g/h | INTRAVENOUS | Status: AC
  Administered 2012-08-16 (×2): 2 g/h via INTRAVENOUS
  Filled 2012-08-15 (×2): qty 500

## 2012-08-15 MED ORDER — DIPHENHYDRAMINE HCL 50 MG/ML IJ SOLN: 12.5000 mg | INTRAMUSCULAR | Status: DC | PRN

## 2012-08-15 MED ORDER — HYDRALAZINE HCL 20 MG/ML IJ SOLN: 5.0000 mg | Freq: Once | INTRAMUSCULAR | Status: DC

## 2012-08-15 NOTE — Progress Notes (Signed)
Patient ID: [name], female   DOB: 1975-10-28, 37 y.o.   MRN: [id number]   S:  Pt feeling uncomfortable with ctx q 3 minutes  O:   Filed Vitals:   08/15/12 1544 08/15/12 1621 08/15/12 1733 08/15/12 1849  BP: 162/85 140/72 149/78 148/78  Pulse: 88 76 86 77  Temp:    97.7 F (36.5 C)  TempSrc:    Axillary  Resp:    19  Height:      Weight:        Cervix:  1/50/-3 still posterior.  Attempted to place foley bulb with speculum but rupture of membranes occurred and not able to visualize cervix sufficiently.  FHTs:  135, moderate variability, accels present no decels TOCO:  Ctx q 2-3 minutes  A/P 37 y.o. G5P1031 at [redacted]w[redacted]d here for IOL for IUGR - s/p 2 doses of cytotec, very uncomfortable, still 1 cm and very posterior. Could not place FB due to pt discomfort, posterior location and unable to visualize with speculum - SROM, clear - Will see if patient progresses on own with rupure. -  Will recheck in 1 hour and attempt FB or pitocin as appropriate  Napoleon Form, MD

## 2012-08-15 NOTE — Progress Notes (Signed)
Patient ID: [name], female   DOB: [birth year], 37 y.o.   MRN: [id number]   S:  Pt beginning to feel uncomfortable with ctx q 6-8 minutes. Pt does also c/o mild headache.  O: Filed Vitals:   08/15/12 1331 08/15/12 1338 08/15/12 1354 08/15/12 1410  BP: 179/90 173/81 178/82 175/95  Pulse: 62 58 61 62  Temp:      TempSrc:      Resp:      Height:      Weight:        Cervix:  1/50/-3 very posterior  FHTs: 130-140, mod variability, accels present, possible decel with contraction to 125 during contraction (difficult to assess timing due to uterine irritability) TOCO:  Irregular ctx, not picking up well.  A/P 37 y.o. G5P1031 at [redacted]w[redacted]d with IUGR, CHTN - Received labetalol IV x 1 without effect on BP, HR in 50s. Gave 5 mg IV hydralazine, will repeat 10 mg now and monitor BP closely. - PIH labs negative (urine protein undetectable on protein/creatinine ratio) - Attempted foley bulb but cervix too posterior and pt did not tolerate well. Will give 2nd dose of cytotec and hope to place FB next check. - FHTs overall reassuring, questionable decel of unclear timing around contraction but good variability throughout.  Napoleon Form, MD

## 2012-08-15 NOTE — Anesthesia Preprocedure Evaluation (Signed)
Anesthesia Evaluation  Patient identified by MRN, date of birth, ID band Patient awake    Reviewed: Allergy & Precautions, H&P , NPO status , Patient's Chart, lab work & pertinent test results, reviewed documented beta blocker date and time   Airway Mallampati: I TM Distance: >3 FB Neck ROM: full    Dental no notable dental hx.    Pulmonary neg pulmonary ROS,    Pulmonary exam normal       Cardiovascular hypertension, Pt. on home beta blockers     Neuro/Psych negative neurological ROS  negative psych ROS   GI/Hepatic Neg liver ROS, GERD-  Medicated and Controlled,  Endo/Other  negative endocrine ROS  Renal/GU negative Renal ROS  negative genitourinary   Musculoskeletal negative musculoskeletal ROS (+)   Abdominal Normal abdominal exam  (+)   Peds negative pediatric ROS (+)  Hematology negative hematology ROS (+)   Anesthesia Other Findings   Reproductive/Obstetrics (+) Pregnancy                           Anesthesia Physical Anesthesia Plan  ASA: II  Anesthesia Plan: Epidural   Post-op Pain Management:    Induction:   Airway Management Planned:   Additional Equipment:   Intra-op Plan:   Post-operative Plan:   Informed Consent: I have reviewed the patients History and Physical, chart, labs and discussed the procedure including the risks, benefits and alternatives for the proposed anesthesia with the patient or authorized representative who has indicated his/her understanding and acceptance.     Plan Discussed with:   Anesthesia Plan Comments:         Anesthesia Quick Evaluation

## 2012-08-15 NOTE — Anesthesia Procedure Notes (Signed)
Epidural Patient location during procedure: OB Start time: 08/15/2012 10:20 PM End time: 08/15/2012 10:24 PM  Staffing Anesthesiologist: Sandrea Hughs Performed by: anesthesiologist   Preanesthetic Checklist Completed: patient identified, site marked, surgical consent, pre-op evaluation, timeout performed, IV checked, risks and benefits discussed and monitors and equipment checked  Epidural Patient position: sitting Prep: site prepped and draped and DuraPrep Patient monitoring: continuous pulse ox and blood pressure Approach: midline Injection technique: LOR air  Needle:  Needle type: Tuohy  Needle gauge: 17 G Needle length: 9 cm and 9 Needle insertion depth: 6 cm Catheter type: closed end flexible Catheter size: 19 Gauge Catheter at skin depth: 11 cm Test dose: negative and Other  Assessment Sensory level: T9 Events: blood not aspirated, injection not painful, no injection resistance, negative IV test and no paresthesia

## 2012-08-15 NOTE — H&P (Signed)
[name] is a 37 y.o. female presenting for induction of labor for IUGR. EFW <10% on 2/6 (declined from 26% on 07/04/12). Has been having twice weekly NSTs that have been reactive. Umbilical artery dopplers normal 2/6. Chronic HTN on HCTZ before pregnancy, switched to labetalol, now on 400 mg BID. C/o headache today, 6/10, no vision changes or RUQ pain.   Started feeling moderately strong ctx this morning at 5 AM. No LOF or bleeding. Baby moving well.   Maternal Medical History:  Reason for admission: Contractions.  Nausea.  Contractions: Onset was 3-5 hours ago.   Frequency: irregular.   Perceived severity is moderate.    Fetal activity: Perceived fetal activity is normal.   Last perceived fetal movement was within the past hour.    Prenatal complications: PIH (chronic htn) and IUGR.   Prenatal Complications - Diabetes: none.    OB History   Grav Para Term Preterm Abortions TAB SAB Ect Mult Living   5 1 1  3  3   1      Past Medical History  Diagnosis Date  . Hypertension   . GERD (gastroesophageal reflux disease)   . Miscarriage     x3  . Anemia   . Sickle cell trait    Past Surgical History  Procedure Laterality Date  . Dilation and curettage of uterus      four miscarriages  . Breast surgery      cyst removal 1990   Family History: family history includes Hypertension in her maternal grandfather, maternal grandmother, and mother.  There is no history of Other. Social History:  reports that she has quit smoking. Her smoking use included Cigarettes. She has a 2.5 pack-year smoking history. She has never used smokeless tobacco. She reports that she does not drink alcohol or use illicit drugs.  No current facility-administered medications on file prior to encounter.   Current Outpatient Prescriptions on File Prior to Encounter  Medication Sig Dispense Refill  . labetalol (NORMODYNE) 200 MG tablet Take 2 tablets (400 mg total) by mouth 2 (two) times daily.  120  tablet  2  . lansoprazole (PREVACID) 30 MG capsule Take 30 mg by mouth daily.         Prenatal Transfer Tool  Maternal Diabetes: No Genetic Screening: Normal Maternal Ultrasounds/Referrals: Abnormal:  Findings:   IUGR Fetal Ultrasounds or other Referrals:  Other: normal umbilical artery dopplers 2/6 Maternal Substance Abuse:  No Significant Maternal Medications:  Meds include: Other: Labetalol Significant Maternal Lab Results:  Lab values include: Group B Strep negative Other Comments:  None  Review of Systems  Constitutional: Negative for fever and chills.  Eyes: Negative for blurred vision and double vision.  Respiratory: Negative for shortness of breath and wheezing.   Gastrointestinal: Negative for nausea, vomiting, abdominal pain, diarrhea and constipation.  Genitourinary: Negative for dysuria.  Neurological: Positive for dizziness (lightheaded) and headaches (6/10).      Blood pressure 156/87, pulse 71, temperature 97.4 F (36.3 C), temperature source Oral, resp. rate 20, height 5\' 4"  (1.626 m), weight 67.132 kg (148 lb), last menstrual period 12/08/2011. Maternal Exam:  Uterine Assessment: Contraction strength is moderate.  Contraction frequency is irregular.      Fetal Exam Fetal Monitor Review: Mode: ultrasound.   Baseline rate: 135.  Variability: moderate (6-25 bpm).   Pattern: accelerations present and no decelerations.    Fetal State Assessment: Category I - tracings are normal.     Physical Exam  Constitutional: She is  oriented to person, place, and time. She appears well-developed and well-nourished. No distress.  HENT:  Head: Normocephalic and atraumatic.  Eyes: Conjunctivae and EOM are normal.  Neck: Normal range of motion. Neck supple.  Cardiovascular: Normal rate, regular rhythm and normal heart sounds.   Respiratory: Effort normal and breath sounds normal.  GI: Soft. There is no tenderness. There is no rebound and no guarding.  Musculoskeletal:  Normal range of motion. She exhibits edema (trace). She exhibits no tenderness.  Neurological: She is alert and oriented to person, place, and time.  Skin: Skin is warm and dry.  Psychiatric: She has a normal mood and affect.    Dilation: Fingertip Effacement (%): 50 Cervical Position: Posterior Presentation: Vertex Exam by:: Dr Thad Ranger   Prenatal labs: ABO, Rh: O/Positive/-- (10/23 0000) Antibody: Negative (10/23 0000) Rubella: Immune (10/23 0000) RPR: NON REACTIVE (07/13 1025)  HBsAg: Negative (10/23 0000)  HIV: Non-reactive (10/23 0000)  GBS:     Assessment/Plan: 37 y.o. G5P1031 at [redacted]w[redacted]d here for IOL for IUGR - FHTs category I - Cervix unfavorable, will start with cytotec and try for foley bulb in 4 hours - CHTN:  Monitor BP, PIH labs - IV pain meds, epidural when needed   Napoleon Form 08/15/2012, 9:37 AM

## 2012-08-15 NOTE — Progress Notes (Signed)
[name] is a 37 y.o. G5P1031 at [redacted]w[redacted]d by admitted for IOL for IUGR, CHTN  Subjective: Very uncomfortable, contractions every 3 minutes  Objective: BP 148/78  Pulse 77  Temp(Src) 97.7 F (36.5 C) (Axillary)  Resp 19  Ht 5\' 4"  (1.626 m)  Wt 148 lb (67.132 kg)  BMI 25.39 kg/m2  LMP 12/08/2011      FHT:  FHR: 135 bpm, variability: moderate,  accelerations:  Present,  decelerations:  Absent UC:   regular, every 3 minutes SVE:  Dilation 3, 50 Labs: Lab Results  Component Value Date   WBC 11.5* 08/15/2012   HGB 11.3* 08/15/2012   HCT 33.6* 08/15/2012   MCV 87.7 08/15/2012   PLT 196 08/15/2012    Assessment / Plan: IOL for IUGR and chronic HTN, s/p cytotec x2 and SROMat 19:20, progressing  Labor: progressing normally Preeclampsia:  monitor BP. received labetalol 20mg  IV x1. follow up platelet count Fetal Wellbeing:  Category I Pain Control:  Epidural planned I/D:  GBS neg Anticipated MOD:  NSVD  [name] 08/15/2012, 9:39 PM

## 2012-08-15 NOTE — H&P (Signed)
Chart reviewed and agree with management and plan.  

## 2012-08-16 ENCOUNTER — Encounter (HOSPITAL_COMMUNITY): Payer: Self-pay

## 2012-08-16 LAB — COMPREHENSIVE METABOLIC PANEL
ALT: 16 U/L (ref 0–35)
AST: 21 U/L (ref 0–37)
Albumin: 2.4 g/dL — ABNORMAL LOW (ref 3.5–5.2)
Alkaline Phosphatase: 108 U/L (ref 39–117)
BUN: 8 mg/dL (ref 6–23)
CO2: 24 mEq/L (ref 19–32)
Calcium: 9.1 mg/dL (ref 8.4–10.5)
Chloride: 100 mEq/L (ref 96–112)
Creatinine, Ser: 0.48 mg/dL — ABNORMAL LOW (ref 0.50–1.10)
GFR calc Af Amer: >90 mL/min (ref >90)
GFR calc non Af Amer: >90 mL/min (ref >90)
Glucose, Bld: 78 mg/dL (ref 70–99)
Potassium: 3.7 mEq/L (ref 3.5–5.1)
Sodium: 135 mEq/L (ref 135–145)
Total Bilirubin: 0.3 mg/dL (ref 0.3–1.2)
Total Protein: 5.7 g/dL — ABNORMAL LOW (ref 6.0–8.3)

## 2012-08-16 LAB — CBC
HCT: 32.1 % — ABNORMAL LOW (ref 36.0–46.0)
Hemoglobin: 10.8 g/dL — ABNORMAL LOW (ref 12.0–15.0)
MCH: 29.3 pg (ref 26.0–34.0)
MCHC: 33.6 g/dL (ref 30.0–36.0)
MCV: 87 fL (ref 78.0–100.0)
Platelets: 196 10*3/uL (ref 150–400)
RBC: 3.69 MIL/uL — ABNORMAL LOW (ref 3.87–5.11)
RDW: 14.5 % (ref 11.5–15.5)
WBC: 12.6 10*3/uL — ABNORMAL HIGH (ref 4.0–10.5)

## 2012-08-16 LAB — MRSA PCR SCREENING: MRSA by PCR: NEGATIVE

## 2012-08-16 MED ORDER — CEFAZOLIN SODIUM-DEXTROSE 2-3 GM-% IV SOLR
2.0000 g | INTRAVENOUS | Status: DC
  Filled 2012-08-16: qty 50

## 2012-08-16 MED ORDER — TETANUS-DIPHTH-ACELL PERTUSSIS 5-2.5-18.5 LF-MCG/0.5 IM SUSP
0.5000 mL | Freq: Once | INTRAMUSCULAR | Status: DC
Start: 2012-08-16 — End: 2012-08-16
  Filled 2012-08-16: qty 0.5

## 2012-08-16 MED ORDER — DIBUCAINE 1 % RE OINT: 1.0000 "application " | TOPICAL_OINTMENT | RECTAL | Status: DC | PRN

## 2012-08-16 MED ORDER — PNEUMOCOCCAL VAC POLYVALENT 25 MCG/0.5ML IJ INJ
0.5000 mL | INJECTION | INTRAMUSCULAR | Status: AC
  Filled 2012-08-16: qty 0.5

## 2012-08-16 MED ORDER — LABETALOL HCL 200 MG PO TABS
400.0000 mg | ORAL_TABLET | Freq: Two times a day (BID) | ORAL | Status: DC
  Administered 2012-08-16 – 2012-08-17 (×3): 400 mg via ORAL
  Filled 2012-08-16 (×4): qty 2

## 2012-08-16 MED ORDER — CEFAZOLIN SODIUM-DEXTROSE 2-3 GM-% IV SOLR
2.0000 g | INTRAVENOUS | Status: AC
  Administered 2012-08-17: 2 g via INTRAVENOUS
  Filled 2012-08-16 (×2): qty 50

## 2012-08-16 MED ORDER — OXYCODONE-ACETAMINOPHEN 5-325 MG PO TABS
1.0000 | ORAL_TABLET | ORAL | Status: DC | PRN
  Administered 2012-08-16: 1 via ORAL
  Administered 2012-08-16: 2 via ORAL
  Administered 2012-08-16: 1 via ORAL
  Administered 2012-08-17 – 2012-08-18 (×4): 2 via ORAL
  Filled 2012-08-16 (×4): qty 2
  Filled 2012-08-16: qty 1
  Filled 2012-08-16: qty 2
  Filled 2012-08-16: qty 1

## 2012-08-16 MED ORDER — DIPHENHYDRAMINE HCL 25 MG PO CAPS: 25.0000 mg | ORAL_CAPSULE | Freq: Four times a day (QID) | ORAL | Status: DC | PRN

## 2012-08-16 MED ORDER — WITCH HAZEL-GLYCERIN EX PADS: 1.0000 "application " | MEDICATED_PAD | CUTANEOUS | Status: DC | PRN

## 2012-08-16 MED ORDER — IBUPROFEN 600 MG PO TABS
600.0000 mg | ORAL_TABLET | Freq: Four times a day (QID) | ORAL | Status: DC
  Administered 2012-08-16 – 2012-08-18 (×9): 600 mg via ORAL
  Filled 2012-08-16 (×9): qty 1

## 2012-08-16 MED ORDER — PRENATAL MULTIVITAMIN CH
1.0000 | ORAL_TABLET | Freq: Every day | ORAL | Status: DC
  Administered 2012-08-16 – 2012-08-17 (×2): 1 via ORAL
  Filled 2012-08-16 (×2): qty 1

## 2012-08-16 MED ORDER — SIMETHICONE 80 MG PO CHEW: 80.0000 mg | CHEWABLE_TABLET | ORAL | Status: DC | PRN

## 2012-08-16 MED ORDER — LIDOCAINE-EPINEPHRINE (PF) 2 %-1:200000 IJ SOLN
INTRAMUSCULAR | Status: AC
  Filled 2012-08-16: qty 20

## 2012-08-16 MED ORDER — SODIUM BICARBONATE 8.4 % IV SOLN
INTRAVENOUS | Status: AC
  Filled 2012-08-16: qty 50

## 2012-08-16 MED ORDER — ONDANSETRON HCL 4 MG/2ML IJ SOLN: 4.0000 mg | INTRAMUSCULAR | Status: DC | PRN

## 2012-08-16 MED ORDER — HYDROCHLOROTHIAZIDE 25 MG PO TABS
25.0000 mg | ORAL_TABLET | Freq: Every day | ORAL | Status: DC
  Administered 2012-08-16 – 2012-08-18 (×3): 25 mg via ORAL
  Filled 2012-08-16 (×3): qty 1

## 2012-08-16 MED ORDER — LANOLIN HYDROUS EX OINT: TOPICAL_OINTMENT | CUTANEOUS | Status: DC | PRN

## 2012-08-16 MED ORDER — BENZOCAINE-MENTHOL 20-0.5 % EX AERO: 1.0000 "application " | INHALATION_SPRAY | CUTANEOUS | Status: DC | PRN

## 2012-08-16 MED ORDER — ZOLPIDEM TARTRATE 5 MG PO TABS: 5.0000 mg | ORAL_TABLET | Freq: Every evening | ORAL | Status: DC | PRN

## 2012-08-16 MED ORDER — LACTATED RINGERS IV SOLN
INTRAVENOUS | Status: DC
  Administered 2012-08-16 – 2012-08-17 (×3): via INTRAVENOUS

## 2012-08-16 MED ORDER — SENNOSIDES-DOCUSATE SODIUM 8.6-50 MG PO TABS
2.0000 | ORAL_TABLET | Freq: Every day | ORAL | Status: DC
  Administered 2012-08-16 – 2012-08-17 (×3): 2 via ORAL

## 2012-08-16 MED ORDER — HYDRALAZINE HCL 20 MG/ML IJ SOLN
10.0000 mg | Freq: Once | INTRAMUSCULAR | Status: AC
  Administered 2012-08-16: 10 mg via INTRAVENOUS
  Filled 2012-08-16: qty 1

## 2012-08-16 MED ORDER — ONDANSETRON HCL 4 MG PO TABS: 4.0000 mg | ORAL_TABLET | ORAL | Status: DC | PRN

## 2012-08-16 NOTE — Anesthesia Postprocedure Evaluation (Deleted)
  Anesthesia Post-op Note  Patient: [name]  Procedure(s) Performed: Procedure(s): POST PARTUM TUBAL LIGATION (Bilateral)  Patient Location: A-ICU  Anesthesia Type:Epidural  Level of Consciousness: awake  Airway and Oxygen Therapy: Patient Spontanous Breathing  Post-op Pain: mild  Post-op Assessment: Patient's Cardiovascular Status Stable and Respiratory Function Stable  Post-op Vital Signs: stable  Complications: No apparent anesthesia complications

## 2012-08-16 NOTE — Progress Notes (Signed)
Post Partum Day #1  Subjective: no complaints, tolerating PO and foley catheter in, pain controlled, no HA or vision changes  Objective: Blood pressure 153/80, pulse 74, temperature 98.1 F (36.7 C), temperature source Oral, resp. rate 16, height 5\' 4"  (1.626 m), weight 61.871 kg (136 lb 6.4 oz), last menstrual period 12/08/2011, SpO2 99.00%, unknown if currently breastfeeding.  Filed Vitals:   08/16/12 0200 08/16/12 0300 08/16/12 0400 08/16/12 0500  BP: 147/70 120/60 131/64 153/80  Pulse: 72 85 76 74  Temp:   98.1 F (36.7 C)   TempSrc:   Oral   Resp: 18 16 14 16   Height:      Weight:      SpO2: 99% 98% 98% 99%    Intake/Output Summary (Last 24 hours) at 08/16/12 1610 Last data filed at 08/16/12 0600  Gross per 24 hour  Intake 1433.75 ml  Output   2875 ml  Net -1441.25 ml   Physical Exam:  General: alert, cooperative and no distress Lochia: appropriate Uterine Fundus: firm Incision: n/a DVT Evaluation: No evidence of DVT seen on physical exam. Negative Homan's sign. No cords or calf tenderness. No significant calf/ankle edema.   Recent Labs  08/15/12 0913 08/15/12 2145  HGB 11.3* 11.3*  HCT 33.6* 33.4*   CMP, CBC pending today  Assessment/Plan: 37 y.o. [id number] on post-partum day #1 s/p SVD, induction for IUGR and chronic HTN - routine postpartum care - CHTN with severe pressures during labor now on Mag for 24 hours PP.   * BP improved on magnesium. Will likely need BP meds to go home. Was on HCTZ prior to  pregnancy. Will restart today or tomorrow and monitor BP.  *  Good diuresis (>200 ml /hour so far)  * LFTs, platelets, protein/creatinine ratio normal on admission, repeat CMP/CBC Pending  *  Mag off at 2300 tonight -  Scheduled for postpartum BTL this morning -  Baby in NICU doing well, planning to breast and bottle feed, currently pumping    LOS: 1 day   Napoleon Form 08/16/2012, 6:32 AM

## 2012-08-16 NOTE — Progress Notes (Signed)
UR chart review completed.  

## 2012-08-16 NOTE — Progress Notes (Signed)
Faculty Practice OB/GYN Attending Note  Patient was scheduled for postpartum bilateral sterilization this morning; however her case was postponed due to an ectopic pregnancy case and cesarean section that have to be done prior to her case.  She was given the choice to wait until after these cases were done, but patient did not want to remain NPO for that long. She opted to be rescheduled to tomorrow morning; case scheduled for 1030 on 08/17/12 with Dr. Marice Potter, Dr. Marice Potter notified.  Will continue current AICU care and magnesium sulfate as ordered.  HCTZ also ordered given elevated BP, continue to monitor BP and change regimen as needed.   Jaynie Collins, MD, FACOG Attending Obstetrician & Gynecologist Faculty Practice, [facility] [place]

## 2012-08-17 ENCOUNTER — Encounter (HOSPITAL_COMMUNITY)
Admission: AD | Disposition: A | Discharge status: Home / Self Care | Payer: Self-pay | Source: Ambulatory Visit | Attending: Family Medicine

## 2012-08-17 ENCOUNTER — Inpatient Hospital Stay (HOSPITAL_COMMUNITY): Payer: Medicaid Other

## 2012-08-17 ENCOUNTER — Encounter (HOSPITAL_COMMUNITY): Payer: Self-pay

## 2012-08-17 DIAGNOSIS — Z302 Encounter for sterilization: Secondary | ICD-10-CM

## 2012-08-17 DIAGNOSIS — Z3009 Encounter for other general counseling and advice on contraception: Secondary | ICD-10-CM

## 2012-08-17 HISTORY — PX: TUBAL LIGATION: SHX77

## 2012-08-17 LAB — CBC
HCT: 27.5 % — ABNORMAL LOW (ref 36.0–46.0)
Hemoglobin: 9.3 g/dL — ABNORMAL LOW (ref 12.0–15.0)
MCH: 29.4 pg (ref 26.0–34.0)
MCHC: 33.8 g/dL (ref 30.0–36.0)
MCV: 87 fL (ref 78.0–100.0)
Platelets: 174 10*3/uL (ref 150–400)
RBC: 3.16 MIL/uL — ABNORMAL LOW (ref 3.87–5.11)
RDW: 14.6 % (ref 11.5–15.5)
WBC: 10.1 10*3/uL (ref 4.0–10.5)

## 2012-08-17 SURGERY — LIGATION, FALLOPIAN TUBE, POSTPARTUM
Anesthesia: Epidural | Laterality: Bilateral

## 2012-08-17 SURGERY — LIGATION, FALLOPIAN TUBE, POSTPARTUM
Anesthesia: Epidural | Site: Abdomen | Laterality: Bilateral | Wound class: Clean Contaminated

## 2012-08-17 MED ORDER — MEPERIDINE HCL 25 MG/ML IJ SOLN: 6.2500 mg | INTRAMUSCULAR | Status: DC | PRN

## 2012-08-17 MED ORDER — DEXAMETHASONE SODIUM PHOSPHATE 10 MG/ML IJ SOLN
INTRAMUSCULAR | Status: AC
  Filled 2012-08-17: qty 1

## 2012-08-17 MED ORDER — SODIUM BICARBONATE 8.4 % IV SOLN
INTRAVENOUS | Status: DC | PRN
  Administered 2012-08-17 (×4): 5 mL via EPIDURAL

## 2012-08-17 MED ORDER — MIDAZOLAM HCL 2 MG/2ML IJ SOLN: 0.5000 mg | Freq: Once | INTRAMUSCULAR | Status: DC | PRN

## 2012-08-17 MED ORDER — PROPOFOL 10 MG/ML IV BOLUS
INTRAVENOUS | Status: DC | PRN
  Administered 2012-08-17 (×4): 5 mg via INTRAVENOUS

## 2012-08-17 MED ORDER — FENTANYL CITRATE 0.05 MG/ML IJ SOLN: 25.0000 ug | INTRAMUSCULAR | Status: DC | PRN

## 2012-08-17 MED ORDER — LACTATED RINGERS IV SOLN
INTRAVENOUS | Status: DC | PRN
  Administered 2012-08-17: 12:00:00 via INTRAVENOUS

## 2012-08-17 MED ORDER — BUPIVACAINE HCL (PF) 0.5 % IJ SOLN
INTRAMUSCULAR | Status: AC
  Filled 2012-08-17: qty 30

## 2012-08-17 MED ORDER — LIDOCAINE HCL (CARDIAC) 20 MG/ML IV SOLN
INTRAVENOUS | Status: AC
  Filled 2012-08-17: qty 5

## 2012-08-17 MED ORDER — MIDAZOLAM HCL 5 MG/5ML IJ SOLN
INTRAMUSCULAR | Status: DC | PRN
  Administered 2012-08-17 (×2): 1 mg via INTRAVENOUS

## 2012-08-17 MED ORDER — PROMETHAZINE HCL 25 MG/ML IJ SOLN: 6.2500 mg | INTRAMUSCULAR | Status: DC | PRN

## 2012-08-17 MED ORDER — ONDANSETRON HCL 4 MG/2ML IJ SOLN
INTRAMUSCULAR | Status: AC
  Filled 2012-08-17: qty 2

## 2012-08-17 MED ORDER — KETOROLAC TROMETHAMINE 30 MG/ML IJ SOLN: 15.0000 mg | Freq: Once | INTRAMUSCULAR | Status: DC | PRN

## 2012-08-17 MED ORDER — FENTANYL CITRATE 0.05 MG/ML IJ SOLN
INTRAMUSCULAR | Status: DC | PRN
  Administered 2012-08-17 (×2): 50 ug via INTRAVENOUS

## 2012-08-17 MED ORDER — FENTANYL CITRATE 0.05 MG/ML IJ SOLN
INTRAMUSCULAR | Status: AC
  Filled 2012-08-17: qty 2

## 2012-08-17 MED ORDER — PROPOFOL 10 MG/ML IV EMUL
INTRAVENOUS | Status: AC
  Filled 2012-08-17: qty 20

## 2012-08-17 MED ORDER — LIDOCAINE-EPINEPHRINE (PF) 2 %-1:200000 IJ SOLN
INTRAMUSCULAR | Status: AC
  Filled 2012-08-17: qty 20

## 2012-08-17 MED ORDER — CEFAZOLIN SODIUM-DEXTROSE 2-3 GM-% IV SOLR: 2.0000 g | INTRAVENOUS | Status: DC

## 2012-08-17 MED ORDER — ONDANSETRON HCL 4 MG/2ML IJ SOLN
INTRAMUSCULAR | Status: DC | PRN
  Administered 2012-08-17: 4 mg via INTRAVENOUS

## 2012-08-17 MED ORDER — SODIUM BICARBONATE 8.4 % IV SOLN
INTRAVENOUS | Status: AC
  Filled 2012-08-17: qty 50

## 2012-08-17 MED ORDER — BUPIVACAINE HCL (PF) 0.5 % IJ SOLN
INTRAMUSCULAR | Status: DC | PRN
  Administered 2012-08-17: 10 mL

## 2012-08-17 MED ORDER — MIDAZOLAM HCL 2 MG/2ML IJ SOLN
INTRAMUSCULAR | Status: AC
  Filled 2012-08-17: qty 2

## 2012-08-17 SURGICAL SUPPLY — 25 items
APL SKNCLS STERI-STRIP NONHPOA (GAUZE/BANDAGES/DRESSINGS)
BENZOIN TINCTURE PRP APPL 2/3 (GAUZE/BANDAGES/DRESSINGS) IMPLANT
CHLORAPREP W/TINT 26ML (MISCELLANEOUS) ×2 IMPLANT
CLOTH BEACON ORANGE TIMEOUT ST (SAFETY) ×1 IMPLANT
ELECT REM PT RETURN 9FT ADLT (ELECTROSURGICAL)
ELECTRODE REM PT RTRN 9FT ADLT (ELECTROSURGICAL) IMPLANT
GLOVE BIO SURGEON STRL SZ7 (GLOVE) ×1 IMPLANT
GLOVE BIOGEL PI IND STRL 7.0 (GLOVE) ×2 IMPLANT
GLOVE BIOGEL PI INDICATOR 7.0 (GLOVE) ×2
GOWN PREVENTION PLUS LG XLONG (DISPOSABLE) ×2 IMPLANT
NDL HYPO 25X1 1.5 SAFETY (NEEDLE) ×1 IMPLANT
NEEDLE HYPO 25X1 1.5 SAFETY (NEEDLE) IMPLANT
NS IRRIG 1000ML POUR BTL (IV SOLUTION) ×1 IMPLANT
PACK ABDOMINAL MINOR (CUSTOM PROCEDURE TRAY) ×1 IMPLANT
PENCIL BUTTON HOLSTER BLD 10FT (ELECTRODE) ×1 IMPLANT
SPONGE LAP 4X18 X RAY DECT (DISPOSABLE) IMPLANT
STRIP CLOSURE SKIN 1/2X4 (GAUZE/BANDAGES/DRESSINGS) IMPLANT
SUT CHROMIC 2 0 TIES 18 (SUTURE) IMPLANT
SUT VIC AB 0 CT1 27 (SUTURE)
SUT VIC AB 0 CT1 27XBRD ANBCTR (SUTURE) ×1 IMPLANT
SUT VICRYL 4-0 PS2 18IN ABS (SUTURE) ×1 IMPLANT
SYR CONTROL 10ML LL (SYRINGE) ×1 IMPLANT
TOWEL OR 17X24 6PK STRL BLUE (TOWEL DISPOSABLE) ×2 IMPLANT
TRAY FOLEY CATH 14FR (SET/KITS/TRAYS/PACK) ×1 IMPLANT
WATER STERILE IRR 1000ML POUR (IV SOLUTION) ×1 IMPLANT

## 2012-08-17 SURGICAL SUPPLY — 15 items
CHLORAPREP W/TINT 26ML (MISCELLANEOUS) ×2 IMPLANT
CLIP FILSHIE TUBAL LIGA STRL (Clip) ×2 IMPLANT
CLOTH BEACON ORANGE TIMEOUT ST (SAFETY) ×2 IMPLANT
GLOVE BIO SURGEON STRL SZ 6.5 (GLOVE) ×4 IMPLANT
GOWN PREVENTION PLUS LG XLONG (DISPOSABLE) ×4 IMPLANT
NS IRRIG 1000ML POUR BTL (IV SOLUTION) ×2 IMPLANT
PACK ABDOMINAL MINOR (CUSTOM PROCEDURE TRAY) ×2 IMPLANT
STRIP CLOSURE SKIN 1/4X3 (GAUZE/BANDAGES/DRESSINGS) ×2 IMPLANT
SUT CHROMIC 0 CT 1 (SUTURE) IMPLANT
SUT VIC AB 0 CT1 27 (SUTURE) ×2
SUT VIC AB 0 CT1 27XBRD ANBCTR (SUTURE) ×1 IMPLANT
SUT VICRYL 4-0 PS2 18IN ABS (SUTURE) ×2 IMPLANT
TOWEL OR 17X24 6PK STRL BLUE (TOWEL DISPOSABLE) ×4 IMPLANT
TRAY FOLEY CATH 14FR (SET/KITS/TRAYS/PACK) ×2 IMPLANT
WATER STERILE IRR 1000ML POUR (IV SOLUTION) ×2 IMPLANT

## 2012-08-17 NOTE — Op Note (Signed)
08/15/2012 - 08/17/2012  12:55 PM  PATIENT:  [name]  37 y.o. female  PRE-OPERATIVE DIAGNOSIS:  desires sterilization  POST-OPERATIVE DIAGNOSIS:  desires sterilization  PROCEDURE:  Procedure(s): POST PARTUM TUBAL LIGATION (Bilateral)  SURGEON:  Surgeon(s) and Role:    * Allie Bossier, MD - Primary  PHYSICIAN ASSISTANT:   ASSISTANTS: none   ANESTHESIA:  epidural  EBL:  Total I/O In: 400 [I.V.:400] Out: 760 [Urine:750; Blood:10]  BLOOD ADMINISTERED:none  DRAINS: none   LOCAL MEDICATIONS USED:  MARCAINE     SPECIMEN:  No Specimen  DISPOSITION OF SPECIMEN: N/A  COUNTS:  YES  TOURNIQUET:  * No tourniquets in log *  DICTATION: .Dragon Dictation  PLAN OF CARE: Admit for overnight observation  PATIENT DISPOSITION:  PACU - hemodynamically stable.   Delay start of Pharmacological VTE agent (>24hrs) due to surgical blood loss or risk of bleeding: not applicable  The risks, benefits, and alternatives of surgery were explained, understood, and accepted. I discussed the failure rate of a tubal ligation of up to one in 200 people. She accepts all risks of surgery and wishes to proceed. Consents were signed, and all questions were answered. In the operating room her epidural was bolused for surgery. After adequate anesthesia was assured her abdomen was prepped with ChloraPrep. 10 mL of 0.5% Marcaine were injected into the subcutaneous tissue at the umbilicus. A transverse umbilical incision was made. The fascia was elevated with Coker clamps, and fascial incision was made with Mayo scissors. The peritoneum was entered with hemostats. I used retractors to visualize the oviduct on the right. It was grasped with a Babcock clamp and traced to its fimbriated end. It was then retraced to the isthmic region where a post clip was placed across the entire tube. No bleeding was noted. I injected 1 mL of marcaine into the tube near the Filsche clip.  The same procedure was done on the other  side. Both oviducts appeared normal. The fascia was then elevated with Coker clamps and closed with a 0 Vicryl running nonlocking suture. No defects were palpable. The subcuticular closure was done with 4-0 Vicryl suture excellent cosmetic results were obtained. She tolerated the procedure well. She was taken to recovery room in stable condition.

## 2012-08-17 NOTE — Progress Notes (Addendum)
Post Partum Day 2 Subjective: no complaints, up ad lib, voiding, tolerating PO and + flatus Patient had to have 1 dose apresoline, after returning from nursery  Objective: Blood pressure 150/87, pulse 81, temperature 98.1 F (36.7 C), temperature source Oral, resp. rate 18, height 5\' 4"  (1.626 m), weight 61.871 kg (136 lb 6.4 oz), last menstrual period 12/08/2011, SpO2 99.00%, unknown if currently breastfeeding. Filed Vitals:   08/17/12 0300 08/17/12 0400 08/17/12 0500 08/17/12 0524  BP: 124/68 125/69 157/83 150/87  Pulse: 73 99 71 81  Temp:  98.1 F (36.7 C)    TempSrc:  Oral    Resp: 16 18  18   Height:      Weight:      SpO2: 98% 99% 99% 99%    Physical Exam:  General: alert, cooperative and no distress Lochia: appropriate Uterine Fundus: firm Incision: na DVT Evaluation: No evidence of DVT seen on physical exam.     Recent Labs  08/15/12 2145 08/16/12 0514  HGB 11.3* 10.8*  HCT 33.4* 32.1*    Assessment/Plan: For post partum tubal ligation today,at 1030 Dr Marice Potter to do   LOS: 2 days   [name] H 08/17/2012, 7:21 AM

## 2012-08-17 NOTE — Transfer of Care (Signed)
Immediate Anesthesia Transfer of Care Note  Patient: [name]  Procedure(s) Performed: Procedure(s): POST PARTUM TUBAL LIGATION (Bilateral)  Patient Location: PACU  Anesthesia Type:Epidural  Level of Consciousness: awake, alert  and oriented  Airway & Oxygen Therapy: Patient Spontanous Breathing  Post-op Assessment: Report given to PACU RN and Post -op Vital signs reviewed and stable  Post vital signs: Reviewed and stable  Complications: No apparent anesthesia complications

## 2012-08-17 NOTE — Transfer of Care (Signed)
Immediate Anesthesia Transfer of Care Note  Patient: [name]  Procedure(s) Performed: Epidural  Patient Location:  L+ D Anesthesia Type:Epidural  Level of Consciousness: awake  Airway & Oxygen Therapy: open and none  Post-op Assessment  Pt and baby are doing well    Post vital signs: stable and contained in OBIX  Complications: no apparent anesthetic complications

## 2012-08-17 NOTE — Anesthesia Postprocedure Evaluation (Signed)
  Anesthesia Post Note  Patient: [name]  Procedure(s) Performed: Procedure(s) (LRB): POST PARTUM TUBAL LIGATION (Bilateral)  Anesthesia type: Epidural  Patient location: PACU  Post pain: Pain level controlled  Post assessment: Post-op Vital signs reviewed  Last Vitals:  Filed Vitals:   08/17/12 1330  BP: 113/69  Pulse: 70  Temp:   Resp:     Post vital signs: Reviewed  Level of consciousness: awake  Complications: No apparent anesthesia complications

## 2012-08-17 NOTE — Anesthesia Postprocedure Evaluation (Signed)
Anesthesia Post Note  Patient: [name]  Procedure(s) Performed: Procedure(s) (LRB): POST PARTUM TUBAL LIGATION (Bilateral)  Anesthesia type: Epidural  Patient location: Mother/Baby  Post pain: Pain level controlled  Post assessment: Post-op Vital signs reviewed  Last Vitals:  Filed Vitals:   08/17/12 0723  BP: 143/92  Pulse: 83  Temp: 36.8 C  Resp: 18    Post vital signs: Reviewed  Level of consciousness: awake  Complications: No apparent anesthesia complications

## 2012-08-17 NOTE — Anesthesia Postprocedure Evaluation (Signed)
  Anesthesia Post-op Note  Patient: [name]  Procedure(s) Performed: Procedure(s): POST PARTUM TUBAL LIGATION (Bilateral)  Patient Location: PACU and Women's Unit  Anesthesia Type:Epidural  Level of Consciousness: awake, alert , oriented and patient cooperative  Airway and Oxygen Therapy: Patient Spontanous Breathing  Post-op Pain: none  Post-op Assessment: Post-op Vital signs reviewed  Post-op Vital Signs: Reviewed and stable  Complications: No apparent anesthesia complications

## 2012-08-17 NOTE — Anesthesia Preprocedure Evaluation (Signed)
Anesthesia Evaluation  Patient identified by MRN, date of birth, ID band Patient awake    Reviewed: Allergy & Precautions, H&P , NPO status , Patient's Chart, lab work & pertinent test results, reviewed documented beta blocker date and time   Airway Mallampati: I TM Distance: >3 FB Neck ROM: full    Dental no notable dental hx.    Pulmonary neg pulmonary ROS,    Pulmonary exam normal       Cardiovascular hypertension, Pt. on home beta blockers     Neuro/Psych negative neurological ROS  negative psych ROS   GI/Hepatic Neg liver ROS, GERD-  Medicated and Controlled,  Endo/Other  negative endocrine ROS  Renal/GU negative Renal ROS  negative genitourinary   Musculoskeletal negative musculoskeletal ROS (+)   Abdominal Normal abdominal exam  (+)   Peds negative pediatric ROS (+)  Hematology negative hematology ROS (+)   Anesthesia Other Findings   Reproductive/Obstetrics                           Anesthesia Physical  Anesthesia Plan  ASA: II  Anesthesia Plan: Epidural   Post-op Pain Management:    Induction:   Airway Management Planned:   Additional Equipment:   Intra-op Plan:   Post-operative Plan:   Informed Consent: I have reviewed the patients History and Physical, chart, labs and discussed the procedure including the risks, benefits and alternatives for the proposed anesthesia with the patient or authorized representative who has indicated his/her understanding and acceptance.     Plan Discussed with:   Anesthesia Plan Comments:         Anesthesia Quick Evaluation

## 2012-08-18 MED ORDER — LABETALOL HCL 300 MG PO TABS: ORAL_TABLET | ORAL | Status: DC

## 2012-08-18 MED ORDER — HYDROCHLOROTHIAZIDE 25 MG PO TABS: 25.0000 mg | ORAL_TABLET | Freq: Every day | ORAL | Status: DC

## 2012-08-18 MED ORDER — LABETALOL HCL 300 MG PO TABS
300.0000 mg | ORAL_TABLET | Freq: Two times a day (BID) | ORAL | Status: DC
  Administered 2012-08-18: 300 mg via ORAL
  Filled 2012-08-18: qty 1

## 2012-08-18 MED ORDER — OXYCODONE-ACETAMINOPHEN 5-325 MG PO TABS: 1.0000 | ORAL_TABLET | ORAL | Status: DC | PRN

## 2012-08-18 MED ORDER — PNEUMOCOCCAL VAC POLYVALENT 25 MCG/0.5ML IJ INJ
0.5000 mL | INJECTION | Freq: Once | INTRAMUSCULAR | Status: AC
  Administered 2012-08-18: 0.5 mL via INTRAMUSCULAR
  Filled 2012-08-18: qty 0.5

## 2012-08-18 NOTE — Progress Notes (Signed)
Pt d/c home with family ambulatory  to private car.  D/c instrcuctions and medications reviewed with patient and family and pt verbalized understanding with teach back.

## 2012-08-18 NOTE — Discharge Summary (Signed)
Physician Discharge Summary  Patient ID: [name] MRN: [id number] DOB/AGE: [birth year] 35 y.o.  Admit date: 08/15/2012 Discharge date: 08/18/2012  Admission Diagnoses: 36.[redacted] weeks EGA, IOL for IUGR, desires sterility  Discharge Diagnoses: same Active Problems:   Spontaneous vaginal delivery   Admission for sterilization   Discharged Condition: good  Hospital Course: She was admitted, induced, and then had a NSVD. She desired a PPS and underwent this procedure without complication. She did well post operatively, although her BP was occasionally elevated.  Consults: None  Significant Diagnostic Studies: none  Treatments: surgery: PPS with Filsche clips  Discharge Exam: Blood pressure 166/80, pulse 69, temperature 97.8 F (36.6 C), temperature source Oral, resp. rate 16, height 5\' 4"  (1.626 m), weight 67.359 kg (148 lb 8 oz), last menstrual period 12/08/2011, SpO2 100.00%, unknown if currently breastfeeding. General appearance: alert Resp: clear to auscultation bilaterally Cardio: regular rate and rhythm, S1, S2 normal, no murmur, click, rub or gallop Incision/Wound:c/d/i Uterus: benign, U-1  Disposition: 01-Home or Self Care   Future Appointments Provider Department Dept Phone   09/23/2012 12:45 PM Deirdre Colin Mulders, [facility] [phone]       Medication List    TAKE these medications       hydrochlorothiazide 25 MG tablet  Commonly known as:  HYDRODIURIL  Take 1 tablet (25 mg total) by mouth daily.     labetalol 200 MG tablet  Commonly known as:  NORMODYNE  Take 2 tablets (400 mg total) by mouth 2 (two) times daily.     labetalol 300 MG tablet  Commonly known as:  NORMODYNE  Take 2 tablets (600 mg) 2 times per day.     lansoprazole 30 MG capsule  Commonly known as:  PREVACID  Take 30 mg by mouth daily.     oxyCODONE-acetaminophen 5-325 MG per tablet  Commonly known as:  PERCOCET/ROXICET  Take 1-2 tablets by mouth every 4 (four) hours as  needed.     prenatal multivitamin Tabs  Take 1 tablet by mouth daily.           Follow-up Information   Follow up with [facility] [place]. Schedule an appointment as soon as possible for a visit in 1 week. (BP check)    Contact information:   [address] [place] [address] [phone]      Signed: Allie Bossier. 08/18/2012, 7:35 AM

## 2012-08-18 NOTE — Clinical Social Work Note (Signed)
Late Entry   Clinical Social Work Department PSYCHOSOCIAL ASSESSMENT - MATERNAL/CHILD 08/18/2012  Patient:  [name], [name]  Account Number:  [id number]  Admit Date:  08/15/2012  Marjo Bicker Name:   Franz Dell    Clinical Social Worker:  Truman Hayward, LCSW   Date/Time:  08/18/2012 11:00 AM  Date Referred:  08/16/2012   Referral source  Physician  RN     Referred reason  NICU   Other referral source:    I:  FAMILY / HOME ENVIRONMENT Child's legal guardian:  PARENT  Guardian - Name Guardian - Age Guardian - Address  [name] 36 [address] [place], [place] [address]  [name]     Other household support members/support persons Name Relationship DOB  [name] MOTHER   brother    18 yo DAUGHTER    Other support:   MOB reports good family support in area.    II  PSYCHOSOCIAL DATA Information Source:  Patient Interview  Event organiser Employment:   Financial resources:  [employer] If Medicaid - [employer]:  [place] Other  WIC   School / Grade:   Maternity Care Coordinator / Child Services Coordination / Early Interventions:  Cultural issues impacting care:    III  STRENGTHS Strengths  Adequate Resources  Supportive family/friends  Understanding of illness  Home prepared for Child (including basic supplies)  Compliance with medical plan   Strength comment:    IV  RISK FACTORS AND CURRENT PROBLEMS Current Problem:  None   Risk Factor & Current Problem Patient Issue Family Issue Risk Factor / Current Problem Comment   N N     V  SOCIAL WORK ASSESSMENT CSW spoke with MOB at bedside.  CSW discussed admission to NICU and communication/understanding of treatment.  MOB reports no concerns and having good communication with doctor's and nurses.  CSW discussed any emotional concerns and MOB expressed some appropriate emotions with admission to NICU, however no concerning symptoms at this time.  CSW discussed PPD symptoms and  instructed MOB to let RN or CSW know if any concerns arise.  CSW discussed supplies and family support.  MOB expressed some concerns with supplies (clothes) however stated she would let CSW know if any needs arise before infant's discharge.  MOB also reported good family support in the area.  She states she lives with her mother, brother, and 97 year old daughter currently. MOB expressed support from FOB, however did not want CSW to put his information on SSI form.  CSW discussed that infant qualifies for SSI financial support while in NICU.  CSW completed worksheet and discussed process to apply and CSW assistance with this.  CSW will continue to follow to offer support while infant in NICU.      VI SOCIAL WORK PLAN Social Work Plan  Psychosocial Support/Ongoing Assessment of Needs   Type of pt/family education:   If child protective services report - county:   If child protective services report - date:   Information/referral to community resources comment:   Other social work plan:

## 2012-08-19 ENCOUNTER — Encounter (HOSPITAL_COMMUNITY): Payer: Self-pay | Admitting: Obstetrics & Gynecology

## 2012-08-22 ENCOUNTER — Other Ambulatory Visit (HOSPITAL_COMMUNITY): Payer: Medicaid Other

## 2012-09-23 ENCOUNTER — Ambulatory Visit: Payer: Medicaid Other | Admitting: Obstetrics and Gynecology

## 2012-09-25 ENCOUNTER — Encounter: Payer: Self-pay | Admitting: Advanced Practice Midwife

## 2012-09-25 ENCOUNTER — Ambulatory Visit: Payer: Medicaid Other | Admitting: Advanced Practice Midwife

## 2012-09-25 MED ORDER — LANSOPRAZOLE 30 MG PO CPDR: 30.0000 mg | DELAYED_RELEASE_CAPSULE | Freq: Every day | ORAL | Status: DC

## 2012-09-25 MED ORDER — LABETALOL HCL 300 MG PO TABS: ORAL_TABLET | ORAL | Status: DC

## 2012-09-25 MED ORDER — HYDROCHLOROTHIAZIDE 25 MG PO TABS: 25.0000 mg | ORAL_TABLET | Freq: Every day | ORAL | Status: DC

## 2012-09-25 NOTE — Patient Instructions (Signed)
Follow up with your primary care doctor as soon as possible.  Return with any women's health concerns.

## 2012-09-25 NOTE — Progress Notes (Unsigned)
Patient ID: [name], female   DOB: September 20, 1975, 37 y.o.   MRN: [id number] Well, mild pain at incision site from BTL - knot of suture palpable in area of tenderness 11 on EPDS - baby still in NICU, no SI/HI Bleeding stopped 2 weeks ago, a little spotting now BP up today - late taking meds, states she is stable when on time for meds, will f/u with PCP Refills sent for HCTZ, Labetalol and Prevacid Pumped x 3 weeks, now formula feeding  Subjective:     [name] is a 37 y.o. female who presents for a postpartum visit. She is {1-10:13787} {time; units:18646} postpartum following a {delivery:12449}. I have fully reviewed the prenatal and intrapartum course. The delivery was at *** gestational weeks. Outcome: {delivery outcome:32078}. Anesthesia: {anesthesia types:812}. Postpartum course has been ***. Baby's course has been ***. Baby is feeding by {breast/bottle:69}. Bleeding {vag bleed:12292}. Bowel function is {normal:32111}. Bladder function is {normal:32111}. Patient {is/is not:9024} sexually active. Contraception method is {contraceptive method:5051}. Postpartum depression screening: {neg default:13464::"negative"}.  {Common ambulatory SmartLinks:19316}  Review of Systems {ros; complete:30496}   Objective:    BP 176/109  Pulse 76  Temp(Src) 97.3 F (36.3 C) (Oral)  Ht 5\' 4"  (1.626 m)  Wt 131 lb 4.8 oz (59.557 kg)  BMI 22.53 kg/m2  LMP 09/23/2012  Breastfeeding? No  General:  {gen appearance:16600}   Breasts:  {breast exam:1202::"inspection negative, no nipple discharge or bleeding, no masses or nodularity palpable"}  Lungs: {lung exam:16931}  Heart:  {heart exam:5510}  Abdomen: {abdomen exam:16834}   Vulva:  {labia exam:12198}  Vagina: {vagina exam:12200}  Cervix:  {cervix exam:14595}  Corpus: {uterus exam:12215}  Adnexa:  {adnexa exam:12223}  Rectal Exam: {rectal/vaginal exam:12274}        Assessment:    *** postpartum exam. Pap smear {done:10129} at today's visit.    Plan:    1. Contraception: {method:5051} 2. *** 3. Follow up in: {1-10:13787} {time; units:19136} or as needed.

## 2013-05-08 ENCOUNTER — Other Ambulatory Visit: Payer: Self-pay

## 2013-08-13 IMAGING — US US UA CORD DOPPLER
2 series · 12 of 28 positions shown · non-contrast
Comparison: none

[Series 1: us ob follow up · 1 of 4 slices shown (1 of 2)]
[im 4/4]
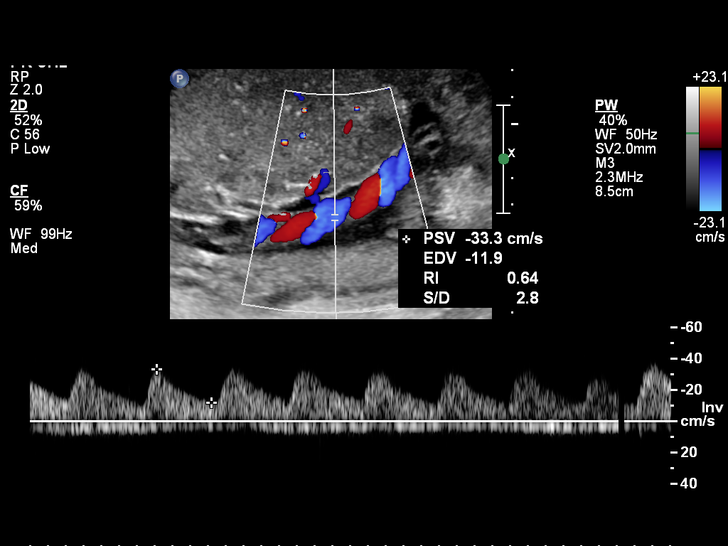

[Series 1: us ob follow up · 52 acquisitions, 11 frames shown (2 of 2)]
[im 3/52]
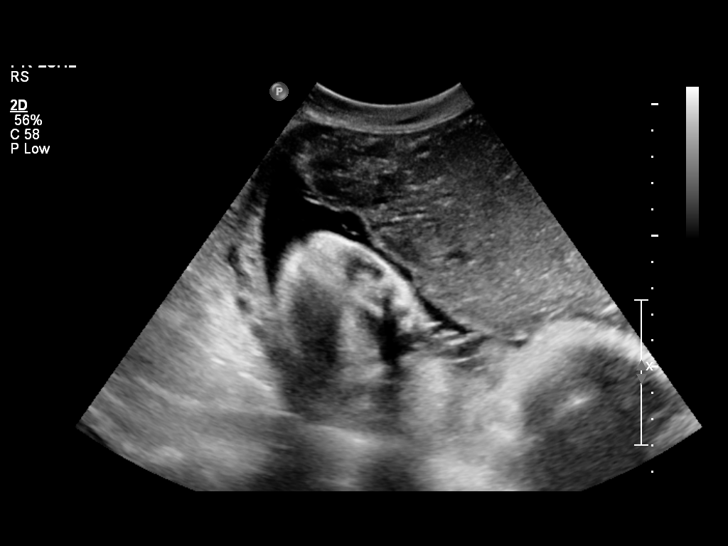
[im 7/52]
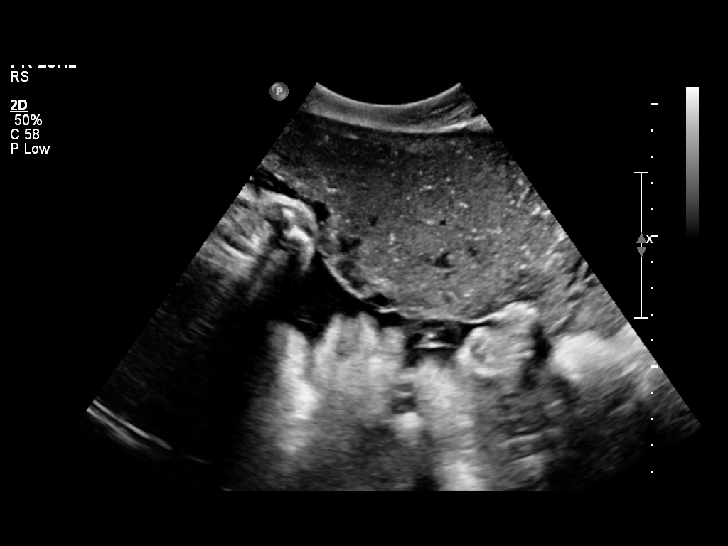
[im 13/52]
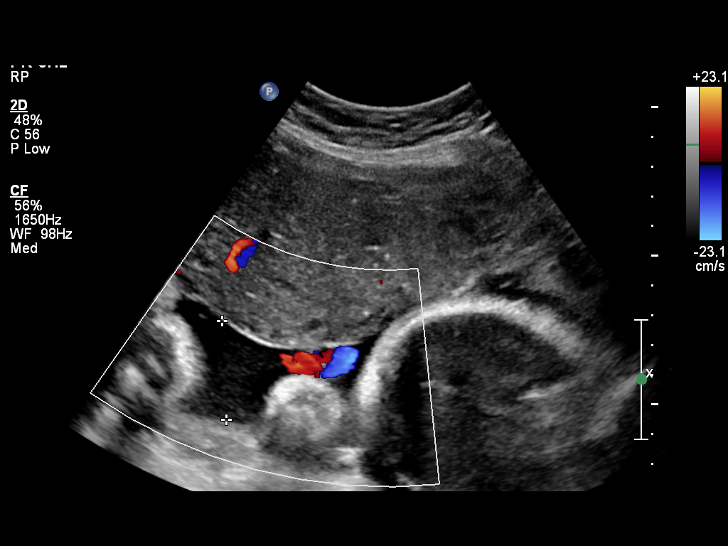
[im 17/52]
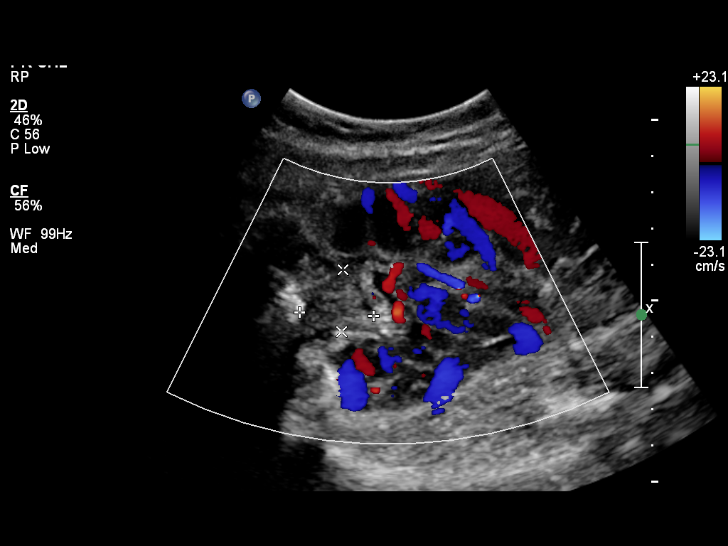
[im 21/52]
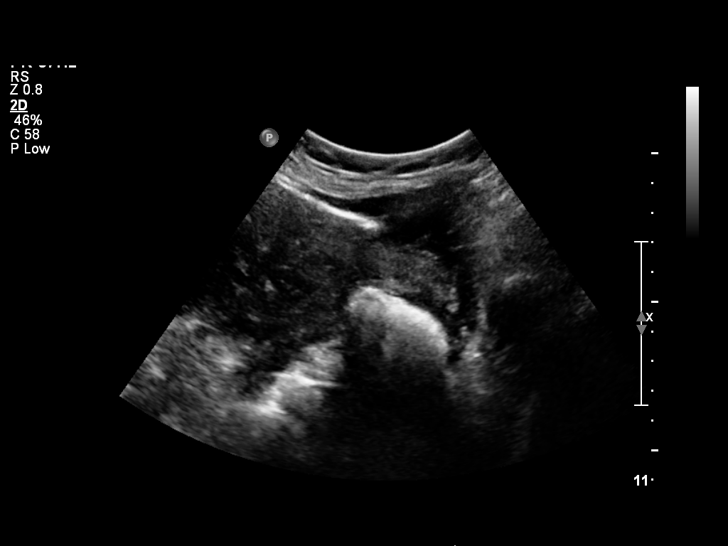
[im 27/52]
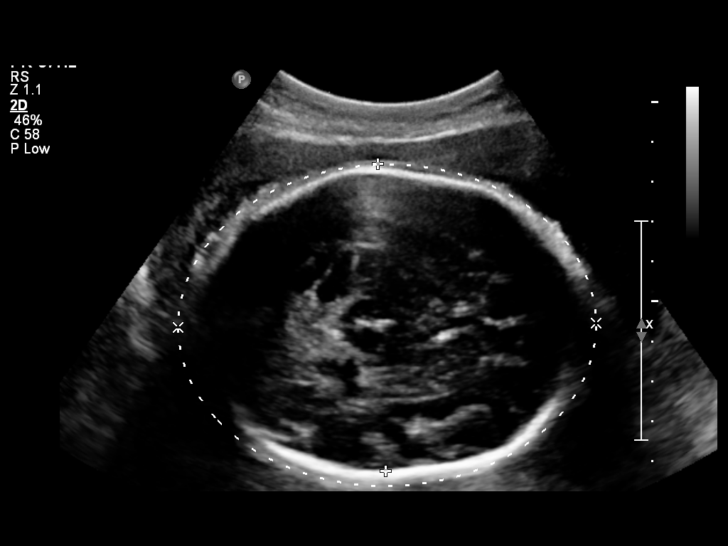
[im 31/52]
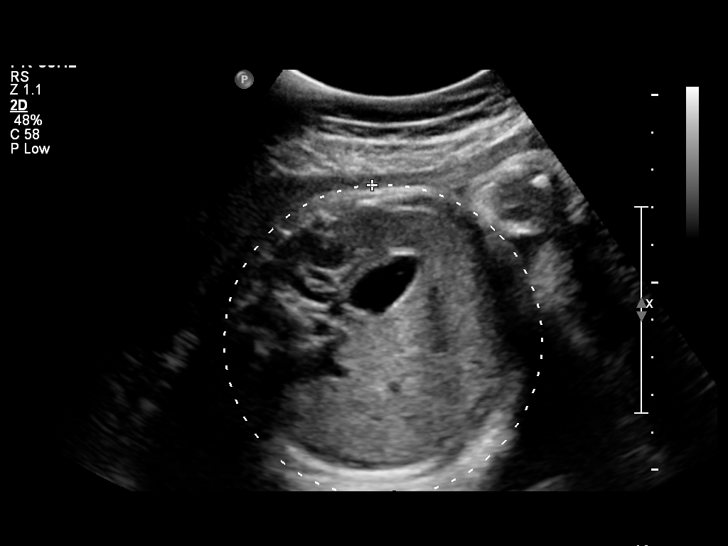
[im 35/52]
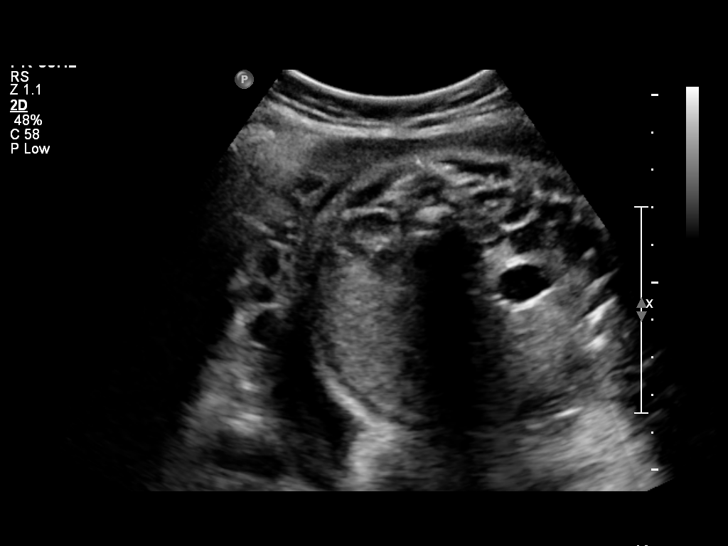
[im 41/52]
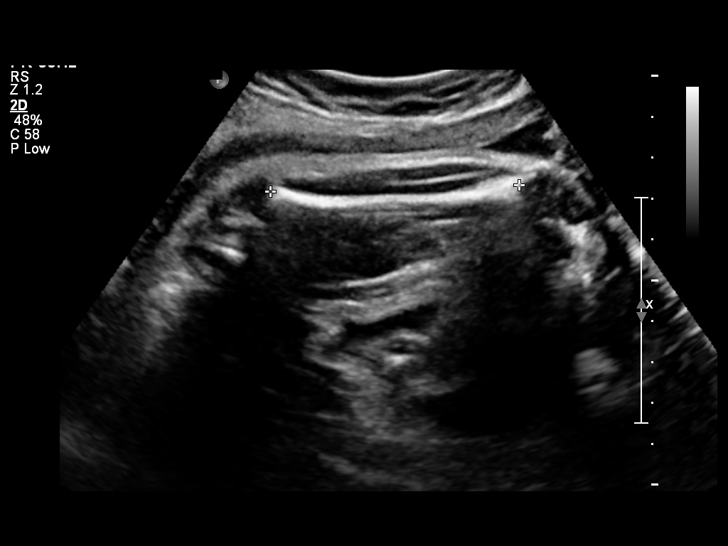
[im 45/52]
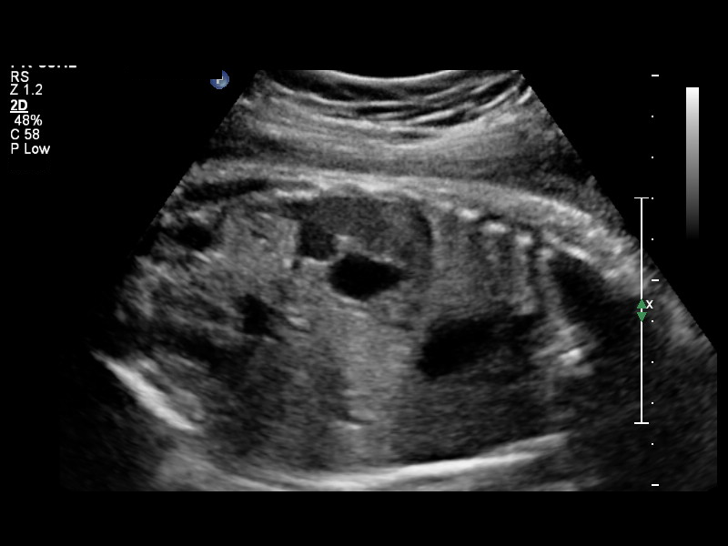
[im 49/52]
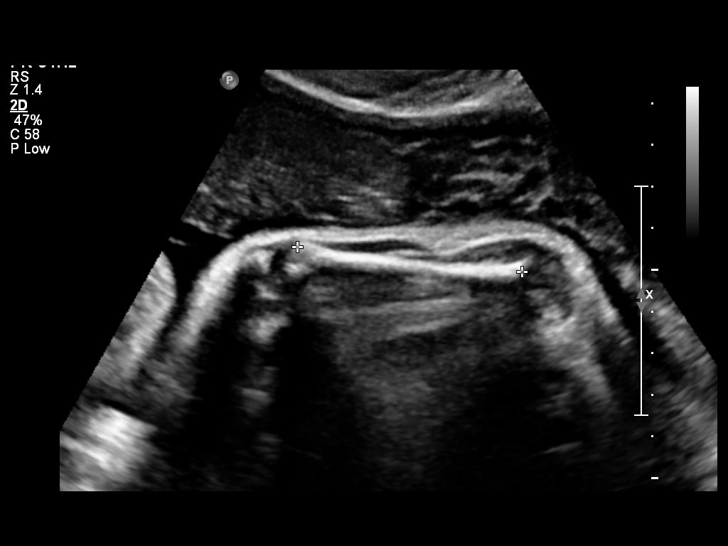

[12 of 28 positions shown; findings below may reference images not displayed]

OBSTETRICS REPORT
                      (Signed Final 08/01/2012 [DATE])

Service(s) Provided

 US OB FOLLOW UP                                       76816.1
 US UA CORD DOPPLER                                    76820.0
Indications

 Assess fetal growth
 Poor obstetric history-Recurrent (habitual) abortion
 (3 consecutive ab's)
 Hypertension - Chronic/Pre-existing (currently on
 Labetalol)
 Assess fetal well-being
 Former smoker
Fetal Evaluation

 Num Of Fetuses:    1
 Fetal Heart Rate:  130                         bpm
 Cardiac Activity:  Observed
 Presentation:      Cephalic
 Placenta:          Anterior, above cervical os
 P. Cord            Not well visualized
 Insertion:

 Amniotic Fluid
 AFI FV:      Subjectively within normal limits
 AFI Sum:     11.95   cm      35   %Tile     Larg Pckt:   3.81   cm
 RUQ:   3.81   cm    RLQ:    2.31   cm    LUQ:   2.52    cm   LLQ:    3.31   cm
Biometry

 BPD:       76  mm    G. Age:   30w 4d                CI:        69.79   70 - 86
                                                      FL/HC:      20.8   20.1 -

 HC:     290.3  mm    G. Age:   32w 0d      < 3  %    HC/AC:      1.10   0.93 -

 AC:       265  mm    G. Age:   30w 4d      < 3  %    FL/BPD:     79.5   71 - 87
 FL:      60.4  mm    G. Age:   31w 3d      < 3  %    FL/AC:      22.8   20 - 24
 HUM:     54.4  mm    G. Age:   31w 4d      < 5  %

 Est. FW:    3966  gm    3 lb 11 oz    < 10  %
Gestational Age

 U/S Today:     31w 1d                                        EDD:   10/02/12
 Best:          35w 0d    Det. By:   Early Ultrasound         EDD:   09/05/12
                                     (01/13/12)
Anatomy

 Cranium:          Appears normal         Aortic Arch:      Previously seen
 Fetal Cavum:      Previously seen        Ductal Arch:      Previously seen
 Ventricles:       Appears normal         Diaphragm:        Appears normal
 Choroid Plexus:   Previously seen        Stomach:          Appears normal, left
                                                            sided
 Cerebellum:       Previously seen        Abdomen:          Appears normal
 Posterior Fossa:  Previously seen        Abdominal Wall:   Previously seen
 Nuchal Fold:      Previously seen        Cord Vessels:     Previously seen
 Face:             Orbits and profile     Kidneys:          Appear normal
                   previously seen
 Lips:             Previously seen        Bladder:          Appears normal
 Palate:           Not well visualized    Spine:            Previously seen
 Heart:            Appears normal         Lower             Previously seen
                   (4CH, axis, and        Extremities:
                   situs)
 RVOT:             Previously seen        Upper             Previously seen
                                          Extremities:
 LVOT:             Previously seen

 Other:  Female gender previously seen. Nasal bone, Heels, and 5th digit
         previously visualized.
Targeted Anatomy

 Fetal Central Nervous System
 Lat. Ventricles:
Doppler - Fetal Vessels

 Umbilical Artery
 S/D:   2.6            57  %tile

Cervix Uterus Adnexa

 Cervix:       Not visualized (advanced GA >34 wks)
 Left Ovary:   Size(cm) L: 3.62 x W: 2.07 x H: 1.73  Volume(cc):
 Right Ovary:  Size(cm) L: 2.03 x W: 1.61 x H: 1.7  Volume(cc):
 Adnexa:     No abnormality visualized.
Comments

 Best EGA was changed to reflect data from early ultrasound
 performed on 01/13/12. Prior ultrasound reports were also
 updated to reflect this new information not available at the
 time of those exams.
Impression

 Single intrauterine gestation demonstrating an estimated
 gestational age by ultrasound of 31w 1d. This is correlated
 with expected estimated gestational age by early ultrasound
 of 35w 0d. EFW is currently at the < 10% compatible with a
 small for gestational age or symmetric intrauterine growth
 restriction. Continued close follow up for growth is
 recommended.

 No late developing fetal anatomic abnormalities are noted
 associated with the lateral ventricles, four chamber heart,
 stomach, kidneys or bladder.

 Subjectively and quantitatively normal amniotic fluid volume.
 Normal U/A S/D ratio for EGA with normal waveforms noted.

 questions or concerns.

## 2013-11-05 ENCOUNTER — Encounter (HOSPITAL_COMMUNITY): Payer: Self-pay | Admitting: Obstetrics & Gynecology

## 2014-05-04 ENCOUNTER — Encounter (HOSPITAL_COMMUNITY): Payer: Self-pay | Admitting: Obstetrics & Gynecology

## 2014-07-16 ENCOUNTER — Encounter (HOSPITAL_COMMUNITY): Payer: Self-pay | Admitting: Obstetrics & Gynecology

## 2014-10-08 ENCOUNTER — Emergency Department (HOSPITAL_COMMUNITY)
Admission: EM | Admit: 2014-10-08 | Discharge: 2014-10-09 | Disposition: A | Discharge status: Home / Self Care | Payer: Self-pay | Attending: Emergency Medicine | Admitting: Emergency Medicine

## 2014-10-08 ENCOUNTER — Encounter (HOSPITAL_COMMUNITY): Payer: Self-pay | Admitting: Emergency Medicine

## 2014-10-08 ENCOUNTER — Emergency Department (HOSPITAL_COMMUNITY): Payer: Self-pay

## 2014-10-08 DIAGNOSIS — Z87891 Personal history of nicotine dependence: Secondary | ICD-10-CM | POA: Insufficient documentation

## 2014-10-08 DIAGNOSIS — R52 Pain, unspecified: Secondary | ICD-10-CM

## 2014-10-08 DIAGNOSIS — D649 Anemia, unspecified: Secondary | ICD-10-CM | POA: Insufficient documentation

## 2014-10-08 DIAGNOSIS — K219 Gastro-esophageal reflux disease without esophagitis: Secondary | ICD-10-CM | POA: Insufficient documentation

## 2014-10-08 DIAGNOSIS — Z79899 Other long term (current) drug therapy: Secondary | ICD-10-CM | POA: Insufficient documentation

## 2014-10-08 DIAGNOSIS — I10 Essential (primary) hypertension: Secondary | ICD-10-CM | POA: Insufficient documentation

## 2014-10-08 DIAGNOSIS — M25552 Pain in left hip: Secondary | ICD-10-CM | POA: Insufficient documentation

## 2014-10-08 LAB — BASIC METABOLIC PANEL
Anion gap: 13 (ref 5–15)
BUN: 7 mg/dL (ref 6–23)
CO2: 20 mmol/L (ref 19–32)
Calcium: 9.4 mg/dL (ref 8.4–10.5)
Chloride: 106 mmol/L (ref 96–112)
Creatinine, Ser: 0.72 mg/dL (ref 0.50–1.10)
GFR calc Af Amer: >90 mL/min (ref >90)
GFR calc non Af Amer: >90 mL/min (ref >90)
Glucose, Bld: 93 mg/dL (ref 70–99)
Potassium: 3.8 mmol/L (ref 3.5–5.1)
Sodium: 139 mmol/L (ref 135–145)

## 2014-10-08 LAB — I-STAT TROPONIN, ED: Troponin i, poc: 0 ng/mL (ref 0.00–0.08)

## 2014-10-08 LAB — CBC
HCT: 37.4 % (ref 36.0–46.0)
Hemoglobin: 13.2 g/dL (ref 12.0–15.0)
MCH: 29.9 pg (ref 26.0–34.0)
MCHC: 35.3 g/dL (ref 30.0–36.0)
MCV: 84.8 fL (ref 78.0–100.0)
Platelets: 297 10*3/uL (ref 150–400)
RBC: 4.41 MIL/uL (ref 3.87–5.11)
RDW: 14.5 % (ref 11.5–15.5)
WBC: 7.5 10*3/uL (ref 4.0–10.5)

## 2014-10-08 MED ORDER — KETOROLAC TROMETHAMINE 30 MG/ML IJ SOLN
30.0000 mg | Freq: Once | INTRAMUSCULAR | Status: AC
  Administered 2014-10-08: 30 mg via INTRAVENOUS
  Filled 2014-10-08: qty 1

## 2014-10-08 MED ORDER — OXYCODONE-ACETAMINOPHEN 5-325 MG PO TABS
1.0000 | ORAL_TABLET | Freq: Once | ORAL | Status: AC
  Administered 2014-10-08: 1 via ORAL
  Filled 2014-10-08: qty 1

## 2014-10-08 NOTE — ED Notes (Signed)
Pt presents with shortness of breath and chest pain that has been ongoing since last night- pt reports she has been having similar pain for the past 3 months but this time it is worse.  Pt admits to drinking ETOH everyday- states she took 3 shots today.  Pt tearful and anxious at present, denies SI/HI.

## 2014-10-08 NOTE — ED Notes (Signed)
MD Campos at the bedside  

## 2014-10-09 ENCOUNTER — Emergency Department (HOSPITAL_COMMUNITY): Payer: Self-pay

## 2014-10-09 MED ORDER — LABETALOL HCL 300 MG PO TABS
300.0000 mg | ORAL_TABLET | Freq: Once | ORAL | Status: AC
  Administered 2014-10-09: 300 mg via ORAL
  Filled 2014-10-09: qty 1

## 2014-10-09 MED ORDER — HYDROCHLOROTHIAZIDE 25 MG PO TABS: 25.0000 mg | ORAL_TABLET | Freq: Every day | ORAL | Status: DC

## 2014-10-09 MED ORDER — LABETALOL HCL 300 MG PO TABS: ORAL_TABLET | ORAL | Status: DC

## 2014-10-09 MED ORDER — METHOCARBAMOL 750 MG PO TABS: 750.0000 mg | ORAL_TABLET | Freq: Four times a day (QID) | ORAL | Status: DC

## 2014-10-09 MED ORDER — METHOCARBAMOL 500 MG PO TABS
1000.0000 mg | ORAL_TABLET | Freq: Once | ORAL | Status: AC
  Administered 2014-10-09: 1000 mg via ORAL
  Filled 2014-10-09: qty 2

## 2014-10-09 MED ORDER — HYDROCHLOROTHIAZIDE 25 MG PO TABS
25.0000 mg | ORAL_TABLET | Freq: Every day | ORAL | Status: DC
  Administered 2014-10-09: 25 mg via ORAL
  Filled 2014-10-09: qty 1

## 2014-10-09 MED ORDER — HYDROMORPHONE HCL 1 MG/ML IJ SOLN
1.0000 mg | Freq: Once | INTRAMUSCULAR | Status: AC
  Administered 2014-10-09: 1 mg via INTRAVENOUS
  Filled 2014-10-09: qty 1

## 2014-10-09 NOTE — Discharge Instructions (Signed)
Arthralgia °Your caregiver has diagnosed you as suffering from an arthralgia. Arthralgia means there is pain in a joint. This can come from many reasons including: °· Bruising the joint which causes soreness (inflammation) in the joint. °· Wear and tear on the joints which occur as we grow older (osteoarthritis). °· Overusing the joint. °· Various forms of arthritis. °· Infections of the joint. °Regardless of the cause of pain in your joint, most of these different pains respond to anti-inflammatory drugs and rest. The exception to this is when a joint is infected, and these cases are treated with antibiotics, if it is a bacterial infection. °HOME CARE INSTRUCTIONS  °· Rest the injured area for as long as directed by your caregiver. Then slowly start using the joint as directed by your caregiver and as the pain allows. Crutches as directed may be useful if the ankles, knees or hips are involved. If the knee was splinted or casted, continue use and care as directed. If an stretchy or elastic wrapping bandage has been applied today, it should be removed and re-applied every 3 to 4 hours. It should not be applied tightly, but firmly enough to keep swelling down. Watch toes and feet for swelling, bluish discoloration, coldness, numbness or excessive pain. If any of these problems (symptoms) occur, remove the ace bandage and re-apply more loosely. If these symptoms persist, contact your caregiver or return to this location. °· For the first 24 hours, keep the injured extremity elevated on pillows while lying down. °· Apply ice for 15-20 minutes to the sore joint every couple hours while awake for the first half day. Then 03-04 times per day for the first 48 hours. Put the ice in a plastic bag and place a towel between the bag of ice and your skin. °· Wear any splinting, casting, elastic bandage applications, or slings as instructed. °· Only take over-the-counter or prescription medicines for pain, discomfort, or fever as  directed by your caregiver. Do not use aspirin immediately after the injury unless instructed by your physician. Aspirin can cause increased bleeding and bruising of the tissues. °· If you were given crutches, continue to use them as instructed and do not resume weight bearing on the sore joint until instructed. °Persistent pain and inability to use the sore joint as directed for more than 2 to 3 days are warning signs indicating that you should see a caregiver for a follow-up visit as soon as possible. Initially, a hairline fracture (break in bone) may not be evident on X-rays. Persistent pain and swelling indicate that further evaluation, non-weight bearing or use of the joint (use of crutches or slings as instructed), or further X-rays are indicated. X-rays may sometimes not show a small fracture until a week or 10 days later. Make a follow-up appointment with your own caregiver or one to whom we have referred you. A radiologist (specialist in reading X-rays) may read your X-rays. Make sure you know how you are to obtain your X-ray results. Do not assume everything is normal if you do not hear from us. °SEEK MEDICAL CARE IF: °Bruising, swelling, or pain increases. °SEEK IMMEDIATE MEDICAL CARE IF:  °· Your fingers or toes are numb or blue. °· The pain is not responding to medications and continues to stay the same or get worse. °· The pain in your joint becomes severe. °· You develop a fever over 102° F (38.9° C). °· It becomes impossible to move or use the joint. °MAKE SURE YOU:  °·   Understand these instructions.  Will watch your condition.  Will get help right away if you are not doing well or get worse. Document Released: 06/19/2005 Document Revised: 09/11/2011 Document Reviewed: 02/05/2008 [facility] Patient Information 2015 [place], [place]. This information is not intended to replace advice given to you by your health care provider. Make sure you discuss any questions you have with your health care  provider.  DASH Eating Plan DASH stands for "Dietary Approaches to Stop Hypertension." The DASH eating plan is a healthy eating plan that has been shown to reduce high blood pressure (hypertension). Additional health benefits may include reducing the risk of type 2 diabetes mellitus, heart disease, and stroke. The DASH eating plan may also help with weight loss. WHAT DO I NEED TO KNOW ABOUT THE DASH EATING PLAN? For the DASH eating plan, you will follow these general guidelines:  Choose foods with a percent daily value for sodium of less than 5% (as listed on the food label).  Use salt-free seasonings or herbs instead of table salt or sea salt.  Check with your health care provider or pharmacist before using salt substitutes.  Eat lower-sodium products, often labeled as "lower sodium" or "no salt added."  Eat fresh foods.  Eat more vegetables, fruits, and low-fat dairy products.  Choose whole grains. Look for the word "whole" as the first word in the ingredient list.  Choose fish and skinless chicken or [place] more often than red meat. Limit fish, poultry, and meat to 6 oz (170 g) each day.  Limit sweets, desserts, sugars, and sugary drinks.  Choose heart-healthy fats.  Limit cheese to 1 oz (28 g) per day.  Eat more home-cooked food and less restaurant, buffet, and fast food.  Limit fried foods.  Cook foods using methods other than frying.  Limit canned vegetables. If you do use them, rinse them well to decrease the sodium.  When eating at a restaurant, ask that your food be prepared with less salt, or no salt if possible. WHAT FOODS CAN I EAT? Seek help from a dietitian for individual calorie needs. Grains Whole grain or whole wheat bread. Brown rice. Whole grain or whole wheat pasta. Quinoa, bulgur, and whole grain cereals. Low-sodium cereals. Corn or whole wheat flour tortillas. Whole grain cornbread. Whole grain crackers. Low-sodium crackers. Vegetables Fresh or frozen  vegetables (raw, steamed, roasted, or grilled). Low-sodium or reduced-sodium tomato and vegetable juices. Low-sodium or reduced-sodium tomato sauce and paste. Low-sodium or reduced-sodium canned vegetables.  Fruits All fresh, canned (in natural juice), or frozen fruits. Meat and Other Protein Products Ground beef (85% or leaner), grass-fed beef, or beef trimmed of fat. Skinless chicken or [place]. Ground chicken or [place]. Pork trimmed of fat. All fish and seafood. Eggs. Dried beans, peas, or lentils. Unsalted nuts and seeds. Unsalted canned beans. Dairy Low-fat dairy products, such as skim or 1% milk, 2% or reduced-fat cheeses, low-fat ricotta or cottage cheese, or plain low-fat yogurt. Low-sodium or reduced-sodium cheeses. Fats and Oils Tub margarines without trans fats. Light or reduced-fat mayonnaise and salad dressings (reduced sodium). Avocado. Safflower, olive, or canola oils. Natural peanut or almond butter. Other Unsalted popcorn and pretzels. The items listed above may not be a complete list of recommended foods or beverages. Contact your dietitian for more options. WHAT FOODS ARE NOT RECOMMENDED? Grains White bread. White pasta. White rice. Refined cornbread. Bagels and croissants. Crackers that contain trans fat. Vegetables Creamed or fried vegetables. Vegetables in a cheese sauce. Regular canned vegetables. Regular canned  tomato sauce and paste. Regular tomato and vegetable juices. Fruits Dried fruits. Canned fruit in light or heavy syrup. Fruit juice. Meat and Other Protein Products Fatty cuts of meat. Ribs, chicken wings, bacon, sausage, bologna, salami, chitterlings, fatback, hot dogs, bratwurst, and packaged luncheon meats. Salted nuts and seeds. Canned beans with salt. Dairy Whole or 2% milk, cream, half-and-half, and cream cheese. Whole-fat or sweetened yogurt. Full-fat cheeses or blue cheese. Nondairy creamers and whipped toppings. Processed cheese, cheese spreads, or cheese  curds. Condiments Onion and garlic salt, seasoned salt, table salt, and sea salt. Canned and packaged gravies. Worcestershire sauce. Tartar sauce. Barbecue sauce. Teriyaki sauce. Soy sauce, including reduced sodium. Steak sauce. Fish sauce. Oyster sauce. Cocktail sauce. Horseradish. Ketchup and mustard. Meat flavorings and tenderizers. Bouillon cubes. Hot sauce. Tabasco sauce. Marinades. Taco seasonings. Relishes. Fats and Oils Butter, stick margarine, lard, shortening, ghee, and bacon fat. Coconut, palm kernel, or palm oils. Regular salad dressings. Other Pickles and olives. Salted popcorn and pretzels. The items listed above may not be a complete list of foods and beverages to avoid. Contact your dietitian for more information. WHERE CAN I FIND MORE INFORMATION? National Heart, Lung, and Blood Institute: [employer] Document Released: 06/08/2011 Document Revised: 11/03/2013 Document Reviewed: 04/23/2013 [facility] Patient Information 2015 [place], [place]. This information is not intended to replace advice given to you by your health care provider. Make sure you discuss any questions you have with your health care provider.   Hypertension Hypertension, commonly called high blood pressure, is when the force of blood pumping through your arteries is too strong. Your arteries are the blood vessels that carry blood from your heart throughout your body. A blood pressure reading consists of a higher number over a lower number, such as 110/72. The higher number (systolic) is the pressure inside your arteries when your heart pumps. The lower number (diastolic) is the pressure inside your arteries when your heart relaxes. Ideally you want your blood pressure below 120/80. Hypertension forces your heart to work harder to pump blood. Your arteries may become narrow or stiff. Having hypertension puts you at risk for heart disease, stroke, and other problems.  RISK  FACTORS Some risk factors for high blood pressure are controllable. Others are not.  Risk factors you cannot control include:   Race. You may be at higher risk if you are African American.  Age. Risk increases with age.  Gender. Men are at higher risk than women before age 52 years. After age 65, women are at higher risk than men. Risk factors you can control include:  Not getting enough exercise or physical activity.  Being overweight.  Getting too much fat, sugar, calories, or salt in your diet.  Drinking too much alcohol. SIGNS AND SYMPTOMS Hypertension does not usually cause signs or symptoms. Extremely high blood pressure (hypertensive crisis) may cause headache, anxiety, shortness of breath, and nosebleed. DIAGNOSIS  To check if you have hypertension, your health care provider will measure your blood pressure while you are seated, with your arm held at the level of your heart. It should be measured at least twice using the same arm. Certain conditions can cause a difference in blood pressure between your right and left arms. A blood pressure reading that is higher than normal on one occasion does not mean that you need treatment. If one blood pressure reading is high, ask your health care provider about having it checked again. TREATMENT  Treating high blood pressure includes making lifestyle changes and possibly taking  medicine. Living a healthy lifestyle can help lower high blood pressure. You may need to change some of your habits. Lifestyle changes may include:  Following the DASH diet. This diet is high in fruits, vegetables, and whole grains. It is low in salt, red meat, and added sugars.  Getting at least 2 hours of brisk physical activity every week.  Losing weight if necessary.  Not smoking.  Limiting alcoholic beverages.  Learning ways to reduce stress. If lifestyle changes are not enough to get your blood pressure under control, your health care provider may  prescribe medicine. You may need to take more than one. Work closely with your health care provider to understand the risks and benefits. HOME CARE INSTRUCTIONS  Have your blood pressure rechecked as directed by your health care provider.   Take medicines only as directed by your health care provider. Follow the directions carefully. Blood pressure medicines must be taken as prescribed. The medicine does not work as well when you skip doses. Skipping doses also puts you at risk for problems.   Do not smoke.   Monitor your blood pressure at home as directed by your health care provider. SEEK MEDICAL CARE IF:   You think you are having a reaction to medicines taken.  You have recurrent headaches or feel dizzy.  You have swelling in your ankles.  You have trouble with your vision. SEEK IMMEDIATE MEDICAL CARE IF:  You develop a severe headache or confusion.  You have unusual weakness, numbness, or feel faint.  You have severe chest or abdominal pain.  You vomit repeatedly.  You have trouble breathing. MAKE SURE YOU:   Understand these instructions.  Will watch your condition.  Will get help right away if you are not doing well or get worse. Document Released: 06/19/2005 Document Revised: 11/03/2013 Document Reviewed: 04/11/2013 [facility] Patient Information 2015 [place], [place]. This information is not intended to replace advice given to you by your health care provider. Make sure you discuss any questions you have with your health care provider.  Managing Your High Blood Pressure Blood pressure is a measurement of how forceful your blood is pressing against the walls of the arteries. Arteries are muscular tubes within the circulatory system. Blood pressure does not stay the same. Blood pressure rises when you are active, excited, or nervous; and it lowers during sleep and relaxation. If the numbers measuring your blood pressure stay above normal most of the time, you are at  risk for health problems. High blood pressure (hypertension) is a long-term (chronic) condition in which blood pressure is elevated. A blood pressure reading is recorded as two numbers, such as 120 over 80 (or 120/80). The first, higher number is called the systolic pressure. It is a measure of the pressure in your arteries as the heart beats. The second, lower number is called the diastolic pressure. It is a measure of the pressure in your arteries as the heart relaxes between beats.  Keeping your blood pressure in a normal range is important to your overall health and prevention of health problems, such as heart disease and stroke. When your blood pressure is uncontrolled, your heart has to work harder than normal. High blood pressure is a very common condition in adults because blood pressure tends to rise with age. Men and women are equally likely to have hypertension but at different times in life. Before age 15, men are more likely to have hypertension. After 37 years of age, women are more likely to  have it. Hypertension is especially common in African Americans. This condition often has no signs or symptoms. The cause of the condition is usually not known. Your caregiver can help you come up with a plan to keep your blood pressure in a normal, healthy range. BLOOD PRESSURE STAGES Blood pressure is classified into four stages: normal, prehypertension, stage 1, and stage 2. Your blood pressure reading will be used to determine what type of treatment, if any, is necessary. Appropriate treatment options are tied to these four stages:  Normal  Systolic pressure (mm Hg): below 120.  Diastolic pressure (mm Hg): below 80. Prehypertension  Systolic pressure (mm Hg): 120 to 139.  Diastolic pressure (mm Hg): 80 to 89. Stage1  Systolic pressure (mm Hg): 140 to 159.  Diastolic pressure (mm Hg): 90 to 99. Stage2  Systolic pressure (mm Hg): 160 or above.  Diastolic pressure (mm Hg): 100 or  above. RISKS RELATED TO HIGH BLOOD PRESSURE Managing your blood pressure is an important responsibility. Uncontrolled high blood pressure can lead to:  A heart attack.  A stroke.  A weakened blood vessel (aneurysm).  Heart failure.  Kidney damage.  Eye damage.  Metabolic syndrome.  Memory and concentration problems. HOW TO MANAGE YOUR BLOOD PRESSURE Blood pressure can be managed effectively with lifestyle changes and medicines (if needed). Your caregiver will help you come up with a plan to bring your blood pressure within a normal range. Your plan should include the following: Education  Read all information provided by your caregivers about how to control blood pressure.  Educate yourself on the latest guidelines and treatment recommendations. New research is always being done to further define the risks and treatments for high blood pressure. Lifestylechanges  Control your weight.  Avoid smoking.  Stay physically active.  Reduce the amount of salt in your diet.  Reduce stress.  Control any chronic conditions, such as high cholesterol or diabetes.  Reduce your alcohol intake. Medicines  Several medicines (antihypertensive medicines) are available, if needed, to bring blood pressure within a normal range. Communication  Review all the medicines you take with your caregiver because there may be side effects or interactions.  Talk with your caregiver about your diet, exercise habits, and other lifestyle factors that may be contributing to high blood pressure.  See your caregiver regularly. Your caregiver can help you create and adjust your plan for managing high blood pressure. RECOMMENDATIONS FOR TREATMENT AND FOLLOW-UP  The following recommendations are based on current guidelines for managing high blood pressure in nonpregnant adults. Use these recommendations to identify the proper follow-up period or treatment option based on your blood pressure reading. You  can discuss these options with your caregiver.  Systolic pressure of 120 to 139 or diastolic pressure of 80 to 89: Follow up with your caregiver as directed.  Systolic pressure of 140 to 160 or diastolic pressure of 90 to 100: Follow up with your caregiver within 2 months.  Systolic pressure above 160 or diastolic pressure above 100: Follow up with your caregiver within 1 month.  Systolic pressure above 180 or diastolic pressure above 110: Consider antihypertensive therapy; follow up with your caregiver within 1 week.  Systolic pressure above 200 or diastolic pressure above 120: Begin antihypertensive therapy; follow up with your caregiver within 1 week. Document Released: 03/13/2012 Document Reviewed: 03/13/2012 [facility] Patient Information 2015 [place], [place]. This information is not intended to replace advice given to you by your health care provider. Make sure you discuss any questions you  have with your health care provider. ° °

## 2014-10-09 NOTE — ED Notes (Signed)
Dr Norlene Campbelltter notified of pt's BP, Dr Norlene Campbelltter in room with pt at this time

## 2014-10-09 NOTE — ED Provider Notes (Signed)
Care assumed from Dr Patria Maneampos awaiting left hip xray.  Pt with chest pain, sob, left hip pain.  She has h/o HTN, has not been taking her medications for some time.  Concern for alcohol abuse.    Results for orders placed or performed during the hospital encounter of 10/08/14  CBC  Result Value Ref Range   WBC 7.5 4.0 - 10.5 K/uL   RBC 4.41 3.87 - 5.11 MIL/uL   Hemoglobin 13.2 12.0 - 15.0 g/dL   HCT 16.137.4 09.636.0 - 04.546.0 %   MCV 84.8 78.0 - 100.0 fL   MCH 29.9 26.0 - 34.0 pg   MCHC 35.3 30.0 - 36.0 g/dL   RDW 40.914.5 81.111.5 - 91.415.5 %   Platelets 297 150 - 400 K/uL  Basic metabolic panel  Result Value Ref Range   Sodium 139 135 - 145 mmol/L   Potassium 3.8 3.5 - 5.1 mmol/L   Chloride 106 96 - 112 mmol/L   CO2 20 19 - 32 mmol/L   Glucose, Bld 93 70 - 99 mg/dL   BUN 7 6 - 23 mg/dL   Creatinine, Ser 7.820.72 0.50 - 1.10 mg/dL   Calcium 9.4 8.4 - 95.610.5 mg/dL   GFR calc non Af Amer >90 >90 mL/min   GFR calc Af Amer >90 >90 mL/min   Anion gap 13 5 - 15  I-stat troponin, ED ([facility])  Result Value Ref Range   Troponin i, poc 0.00 0.00 - 0.08 ng/mL   Comment 3           Dg Chest 2 View  10/08/2014   CLINICAL DATA:  Chest pain starting today.  EXAM: CHEST  2 VIEW  COMPARISON:  Nov 09, 2008  FINDINGS: The heart size and mediastinal contours are stable. The heart size is upper limits are normal. There is no focal infiltrate, pulmonary edema, or pleural effusion. There is scoliosis of spine.  IMPRESSION: No active cardiopulmonary disease.   Electronically Signed   By: Sherian ReinWei-Chen  Lin M.D.   On: 10/08/2014 21:38   Dg Hip Unilat With Pelvis 2-3 Views Left  10/09/2014   CLINICAL DATA:  Left hip pain for years, worsening over the past 4 days. No known injury.  EXAM: LEFT HIP (WITH PELVIS) 2-3 VIEWS  COMPARISON:  None.  FINDINGS: No evidence of fracture, erosion, or focal bone lesion. No evidence of osteonecrosis.  There are prominent bilateral acetabular ridges with mild prominence and bone at the lateral femoral  head neck junction. A large synovial inclusion pit is present on the left femoral neck. 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 No degenerative joint narrowing.  Bilateral tubal ligation clips.  IMPRESSION: 1. No acute findings. 2. 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   Electronically Signed   By: Marnee SpringJonathon  Watts M.D.   On: 10/09/2014 01:06    Given findings on xray, will refer to ortho, start on robaxin.  Will restart bp medications.  Marisa Severinlga Tashawn Laswell, MD 10/09/14 [phone]

## 2014-10-09 NOTE — ED Provider Notes (Signed)
CSN: [id number]     Arrival date & time 10/08/14  2037 History   First MD Initiated Contact with Patient 10/08/14 2211     Chief Complaint  Patient presents with  . Shortness of Breath      HPI Patient presents with ongoing chest discomfort over the past several months that she states feels like a knot sensation when she straightens her legs.  She states it seems to be worse when she stands up tall.  She reports some shortness of breath.  She states her symptoms seem to be worsening.  No history DVT or pulmonary embolism.  She does admit to drinking alcohol daily.  She also reports severe left hip pain that is worse with ambulation walking.  No injury or trauma.  She states that when she attempts to straighten her left leg she gets severe pain in the left lateral hip.  No injury or trauma to her left hip.   Past Medical History  Diagnosis Date  . Hypertension   . GERD (gastroesophageal reflux disease)   . Miscarriage     x3  . Anemia   . Sickle cell trait    Past Surgical History  Procedure Laterality Date  . Dilation and curettage of uterus      four miscarriages  . Breast surgery      cyst removal 1990  . Tubal ligation Bilateral 08/17/2012    Procedure: POST PARTUM TUBAL LIGATION;  Surgeon: Allie Bossier, MD;  Location: [facility];  Service: Gynecology;  Laterality: Bilateral;   Family History  Problem Relation Age of Onset  . Other Neg Hx   . Hypertension Mother   . Hypertension Maternal Grandmother   . Hypertension Maternal Grandfather    History  Substance Use Topics  . Smoking status: Former Smoker -- 0.25 packs/day for 10 years    Types: Cigarettes  . Smokeless tobacco: Never Used  . Alcohol Use: No   OB History    Gravida Para Term Preterm AB TAB SAB Ectopic Multiple Living   0 3 0 3 0 0 2     Review of Systems  All other systems reviewed and are negative.     Allergies  Review of patient's allergies indicates no known allergies.  Home Medications    Prior to Admission medications   Medication Sig Start Date End Date Taking? Authorizing Provider  labetalol (NORMODYNE) 300 MG tablet Take 2 tablets (600 mg) 2 times per day. 09/25/12  Yes Archie Patten, CNM  Multiple Vitamin (MULTI-VITAMIN PO) Take 1 tablet by mouth daily.   Yes Historical Provider, MD  OVER THE COUNTER MEDICATION Take 3 tablets by mouth daily as needed (pain). "Pain Relief PM"   Yes Historical Provider, MD  Pseudoeph-Doxylamine-DM-APAP (NYQUIL PO) Take 15 mLs by mouth daily as needed (cough / cold).   Yes Historical Provider, MD  hydrochlorothiazide (HYDRODIURIL) 25 MG tablet Take 1 tablet (25 mg total) by mouth daily. Patient not taking: Reported on 10/08/2014 09/25/12   Archie Patten, CNM  lansoprazole (PREVACID) 30 MG capsule Take 1 capsule (30 mg total) by mouth daily. Patient not taking: Reported on 10/08/2014 09/25/12   Archie Patten, CNM  oxyCODONE-acetaminophen (PERCOCET/ROXICET) 5-325 MG per tablet Take 1-2 tablets by mouth every 4 (four) hours as needed. Patient not taking: Reported on 10/08/2014 08/18/12   Allie Bossier, MD   BP 182/117 mmHg  Pulse 79  Temp(Src) 98.1 F (36.7 C) (Oral)  Resp 20  Ht 5\' 4"  (1.626 m)  Wt 120 lb (54.432 kg)  BMI 20.59 kg/m2  SpO2 100%  LMP 10/08/2014 Physical Exam  Constitutional: She is oriented to person, place, and time. She appears well-developed and well-nourished. No distress.  HENT:  Head: Normocephalic and atraumatic.  Eyes: EOM are normal.  Neck: Normal range of motion.  Cardiovascular: Normal rate, regular rhythm and normal heart sounds.   Pulmonary/Chest: Effort normal and breath sounds normal.  Abdominal: Soft. She exhibits no distension. There is no tenderness.  Musculoskeletal: Normal range of motion.  Mild pain with range of motion of left hip.  Neurological: She is alert and oriented to person, place, and time.  Skin: Skin is warm and dry.  Psychiatric: She has a normal mood and affect. Judgment  normal.  Nursing note and vitals reviewed.   ED Course  Procedures (including critical care time) Labs Review Labs Reviewed  CBC  BASIC METABOLIC PANEL  Rosezena SensorI-STAT TROPOININ, ED    Imaging Review Dg Chest 2 View  10/08/2014   CLINICAL DATA:  Chest pain starting today.  EXAM: CHEST  2 VIEW  COMPARISON:  Nov 09, 2008  FINDINGS: The heart size and mediastinal contours are stable. The heart size is upper limits are normal. There is no focal infiltrate, pulmonary edema, or pleural effusion. There is scoliosis of spine.  IMPRESSION: No active cardiopulmonary disease.   Electronically Signed   By: Sherian ReinWei-Chen  Lin M.D.   On: 10/08/2014 21:38  I personally reviewed the imaging tests through PACS system I reviewed available ER/hospitalization records through the EMR    EKG Interpretation   Date/Time:  Thursday October 08 2014 20:43:40 EDT Ventricular Rate:  110 PR Interval:  134 QRS Duration: 92 QT Interval:  362 QTC Calculation: 489 R Axis:   -30 Text Interpretation:  Sinus tachycardia Possible Left atrial enlargement  Left axis deviation Abnormal ECG No significant change was found Confirmed  by Neeti Knudtson  MD, Caryn BeeKEVIN ([id number]) on 10/08/2014 11:33:30 PM      MDM   Final diagnoses:  Pain  Left hip pain    My suspicion for ACS is very low.  The patient is not tachycardic or hypoxic.  She did have a heart rate of 110 on arrival but without any intervention or treatment her heart rate is been in the 60s and 70s since then.  My suspicion for DVT or pulmonary embolism is low.  We'll attempt pain control this time.  We'll obtain x-ray of the left hip.  I would be hesitant to prescribe her opioid pain medication for home given her daily alcohol use.  She has a 769-year-old at home.    Azalia BilisKevin Lan Mcneill, MD 10/09/14 [phone]

## 2014-12-10 ENCOUNTER — Encounter (HOSPITAL_COMMUNITY): Payer: Self-pay | Admitting: Obstetrics & Gynecology

## 2016-11-07 ENCOUNTER — Encounter (HOSPITAL_COMMUNITY): Payer: Self-pay | Admitting: Emergency Medicine

## 2016-11-07 ENCOUNTER — Inpatient Hospital Stay (HOSPITAL_COMMUNITY)
Admission: EM | Admit: 2016-11-07 | Discharge: 2016-11-09 | DRG: 305 | Disposition: A | Discharge status: Home / Self Care | Payer: Medicaid Other | Attending: Family Medicine | Admitting: Family Medicine

## 2016-11-07 ENCOUNTER — Emergency Department (HOSPITAL_COMMUNITY): Payer: Medicaid Other

## 2016-11-07 DIAGNOSIS — R636 Underweight: Secondary | ICD-10-CM | POA: Diagnosis present

## 2016-11-07 DIAGNOSIS — Z9119 Patient's noncompliance with other medical treatment and regimen: Secondary | ICD-10-CM

## 2016-11-07 DIAGNOSIS — I160 Hypertensive urgency: Secondary | ICD-10-CM | POA: Diagnosis present

## 2016-11-07 DIAGNOSIS — I5042 Chronic combined systolic (congestive) and diastolic (congestive) heart failure: Secondary | ICD-10-CM | POA: Diagnosis present

## 2016-11-07 DIAGNOSIS — I110 Hypertensive heart disease with heart failure: Secondary | ICD-10-CM | POA: Diagnosis present

## 2016-11-07 DIAGNOSIS — K219 Gastro-esophageal reflux disease without esophagitis: Secondary | ICD-10-CM | POA: Diagnosis present

## 2016-11-07 DIAGNOSIS — J40 Bronchitis, not specified as acute or chronic: Secondary | ICD-10-CM | POA: Diagnosis present

## 2016-11-07 DIAGNOSIS — Z79899 Other long term (current) drug therapy: Secondary | ICD-10-CM

## 2016-11-07 DIAGNOSIS — R51 Headache: Secondary | ICD-10-CM | POA: Diagnosis present

## 2016-11-07 DIAGNOSIS — R0600 Dyspnea, unspecified: Secondary | ICD-10-CM

## 2016-11-07 DIAGNOSIS — R079 Chest pain, unspecified: Secondary | ICD-10-CM

## 2016-11-07 DIAGNOSIS — Z8249 Family history of ischemic heart disease and other diseases of the circulatory system: Secondary | ICD-10-CM

## 2016-11-07 DIAGNOSIS — F17210 Nicotine dependence, cigarettes, uncomplicated: Secondary | ICD-10-CM | POA: Diagnosis present

## 2016-11-07 DIAGNOSIS — I161 Hypertensive emergency: Principal | ICD-10-CM | POA: Diagnosis present

## 2016-11-07 DIAGNOSIS — Z23 Encounter for immunization: Secondary | ICD-10-CM

## 2016-11-07 DIAGNOSIS — D573 Sickle-cell trait: Secondary | ICD-10-CM | POA: Diagnosis present

## 2016-11-07 DIAGNOSIS — R0601 Orthopnea: Secondary | ICD-10-CM

## 2016-11-07 DIAGNOSIS — Z681 Body mass index (BMI) 19 or less, adult: Secondary | ICD-10-CM

## 2016-11-07 LAB — CBC
HCT: 39.3 % (ref 36.0–46.0)
Hemoglobin: 13.4 g/dL (ref 12.0–15.0)
MCH: 28.9 pg (ref 26.0–34.0)
MCHC: 34.1 g/dL (ref 30.0–36.0)
MCV: 84.9 fL (ref 78.0–100.0)
Platelets: 333 10*3/uL (ref 150–400)
RBC: 4.63 MIL/uL (ref 3.87–5.11)
RDW: 13.7 % (ref 11.5–15.5)
WBC: 7.2 10*3/uL (ref 4.0–10.5)

## 2016-11-07 LAB — BASIC METABOLIC PANEL
Anion gap: 10 (ref 5–15)
BUN: 13 mg/dL (ref 6–20)
CO2: 21 mmol/L — ABNORMAL LOW (ref 22–32)
Calcium: 9.4 mg/dL (ref 8.9–10.3)
Chloride: 105 mmol/L (ref 101–111)
Creatinine, Ser: 1.14 mg/dL — ABNORMAL HIGH (ref 0.44–1.00)
GFR calc Af Amer: >60 mL/min (ref >60)
GFR calc non Af Amer: >60 mL/min (ref >60)
Glucose, Bld: 101 mg/dL — ABNORMAL HIGH (ref 65–99)
Potassium: 3.7 mmol/L (ref 3.5–5.1)
Sodium: 136 mmol/L (ref 135–145)

## 2016-11-07 LAB — I-STAT TROPONIN, ED: Troponin i, poc: 0.01 ng/mL (ref 0.00–0.08)

## 2016-11-07 MED ORDER — IPRATROPIUM-ALBUTEROL 0.5-2.5 (3) MG/3ML IN SOLN
3.0000 mL | Freq: Once | RESPIRATORY_TRACT | Status: AC
  Administered 2016-11-07: 3 mL via RESPIRATORY_TRACT
  Filled 2016-11-07: qty 3

## 2016-11-07 MED ORDER — KETOROLAC TROMETHAMINE 15 MG/ML IJ SOLN
15.0000 mg | Freq: Once | INTRAMUSCULAR | Status: AC
  Administered 2016-11-07: 15 mg via INTRAVENOUS
  Filled 2016-11-07: qty 1

## 2016-11-07 MED ORDER — HYDRALAZINE HCL 25 MG PO TABS
25.0000 mg | ORAL_TABLET | Freq: Once | ORAL | Status: AC
  Administered 2016-11-07: 25 mg via ORAL
  Filled 2016-11-07: qty 1

## 2016-11-07 MED ORDER — ASPIRIN 81 MG PO CHEW
324.0000 mg | CHEWABLE_TABLET | Freq: Once | ORAL | Status: AC
  Administered 2016-11-07: 324 mg via ORAL
  Filled 2016-11-07: qty 4

## 2016-11-07 NOTE — ED Provider Notes (Signed)
[facility]-EMERGENCY DEPT Provider Note   CSN: [id number] Arrival date & time: 11/07/16  1955  By signing my name below, I, [name], attest that this documentation has been prepared under the direction and in the presence of Derwood KaplanNanavati, Florence Antonelli, MD. Electronically Signed: Modena JanskyAlbert Schneider, Scribe. 11/07/2016. 11:12 PM.  History   Chief Complaint Chief Complaint  Patient presents with  . Chest Pain   The history is provided by the patient. No language interpreter was used.   HPI Comments: [name] is a 41 y.o. female with a PMHx of HTN who presents to the Emergency Department complaining of constant moderate substernal chest pain that started about 2 days ago. She has been having episodes of pain and SOB that wake her up from sleep. Her pain is worse with laying down, ambulation, and deep breathing. She describes the pain as a non-radiating, sharp sensation. She reports associated SOB (worse with laying flat and ambulation) and cough. She describes the cough as productive of white, yellow sputum and ongoing. She has been non-compliant with HTN medication due to lack of access. She smokes 1 PPD. Denies any prior hx of similar complaint, hx of PE/DVT, estrogen hormone therapy, recent travel, recent surgery, family hx of clotting disorder, illicit drug use, or other complaints at this time.   Past Medical History:  Diagnosis Date  . Anemia   . GERD (gastroesophageal reflux disease)   . Hypertension   . Miscarriage    x3  . Sickle cell trait [facility](HCC)     Patient Active Problem List   Diagnosis Date Noted  . Chest pain 11/08/2016  . Admission for sterilization 08/17/2012  . Spontaneous vaginal delivery 08/16/2012  . Consultation for sterilization 07/25/2012  . Poor fetal growth, affecting management of mother, antepartum condition or complication 07/11/2012  . Chronic hypertension complicating or reason for care during pregnancy 04/30/2012  . Supervision of high-risk pregnancy 04/30/2012  .  Sickle cell trait (HCC) 04/30/2012    Past Surgical History:  Procedure Laterality Date  . BREAST SURGERY     cyst removal 1990  . DILATION AND CURETTAGE OF UTERUS     four miscarriages  . TUBAL LIGATION Bilateral 08/17/2012   Procedure: POST PARTUM TUBAL LIGATION;  Surgeon: Allie BossierMyra C Dove, MD;  Location: [facility];  Service: Gynecology;  Laterality: Bilateral;    OB History    Gravida Para Term Preterm AB Living   5 2 2  0 3 2   SAB TAB Ectopic Multiple Live Births   3 0 0 0 2       Home Medications    Prior to Admission medications   Medication Sig Start Date End Date Taking? Authorizing Provider  hydrochlorothiazide (HYDRODIURIL) 25 MG tablet Take 1 tablet (25 mg total) by mouth daily. Patient not taking: Reported on 11/08/2016 10/09/14   Marisa Severintter, Olga, MD  labetalol (NORMODYNE) 300 MG tablet Take 2 tablets (600 mg) 2 times per day. Patient not taking: Reported on 11/08/2016 10/09/14   Marisa Severintter, Olga, MD  lansoprazole (PREVACID) 30 MG capsule Take 1 capsule (30 mg total) by mouth daily. Patient not taking: Reported on 10/08/2014 09/25/12   Archie PattenFrazier, Natalie K, CNM  methocarbamol (ROBAXIN-750) 750 MG tablet Take 1 tablet (750 mg total) by mouth 4 (four) times daily. Patient not taking: Reported on 11/08/2016 10/09/14   Marisa Severintter, Olga, MD  oxyCODONE-acetaminophen (PERCOCET/ROXICET) 5-325 MG per tablet Take 1-2 tablets by mouth every 4 (four) hours as needed. Patient not taking: Reported on 10/08/2014 08/18/12  Allie Bossier, MD    Family History Family History  Problem Relation Age of Onset  . Hypertension Mother   . Hypertension Maternal Grandmother   . Hypertension Maternal Grandfather   . Other Neg Hx     Social History Social History  Substance Use Topics  . Smoking status: Former Smoker    Packs/day: 0.25    Years: 10.00    Types: Cigarettes  . Smokeless tobacco: Never Used  . Alcohol use No     Allergies   Patient has no known allergies.   Review of Systems Review of Systems    Respiratory: Positive for cough and shortness of breath.   Cardiovascular: Positive for chest pain.  All other systems reviewed and are negative.    Physical Exam Updated Vital Signs BP (!) 174/124 (BP Location: Left Arm)   Pulse (!) 115   Temp 98.3 F (36.8 C) (Oral)   Resp 18   LMP 11/03/2016   SpO2 98%   Physical Exam  Constitutional: She appears well-developed and well-nourished. No distress.  HENT:  Head: Normocephalic.  Eyes: Conjunctivae are normal.  Neck: Neck supple. No JVD present.  Cardiovascular: Regular rhythm.  Tachycardia present.  Exam reveals no friction rub.   No murmur heard. Equal radial pulse bilaterally.   Pulmonary/Chest: Effort normal.  Rhonchi diffusely. No crackles.   Abdominal: Soft.  Musculoskeletal: Normal range of motion. She exhibits no edema.  No calf swelling. No pitting edema.   Neurological: She is alert.  Skin: Skin is warm and dry.  Psychiatric: She has a normal mood and affect.  Nursing note and vitals reviewed.    ED Treatments / Results  DIAGNOSTIC STUDIES: Oxygen Saturation is 98% on RA, normal by my interpretation.    COORDINATION OF CARE: 11:16 PM- Pt advised of plan for treatment and pt agrees.  Labs (all labs ordered are listed, but only abnormal results are displayed) Labs Reviewed  BASIC METABOLIC PANEL - Abnormal; Notable for the following:       Result Value   CO2 21 (*)    Glucose, Bld 101 (*)    Creatinine, Ser 1.14 (*)    All other components within normal limits  CBC  BRAIN NATRIURETIC PEPTIDE  PREGNANCY, URINE  I-STAT TROPOININ, ED  I-STAT TROPOININ, ED    EKG  EKG Interpretation  Date/Time:  Tuesday Nov 07 2016 20:00:53 EDT Ventricular Rate:  112 PR Interval:  144 QRS Duration: 82 QT Interval:  356 QTC Calculation: 485 R Axis:   -22 Text Interpretation:  Sinus tachycardia Biatrial enlargement Possible Anterior infarct , age undetermined Abnormal ECG No acute changes No significant change  since last tracing Confirmed by Derwood Kaplan ([id number]) on 11/07/2016 11:10:52 PM       Radiology Dg Chest 2 View  Result Date: 11/07/2016 CLINICAL DATA:  Central chest pain.  Cough. EXAM: CHEST  2 VIEW COMPARISON:  10/08/2014 FINDINGS: Mild cardiomegaly. Mediastinal contours are unchanged. No pulmonary edema, focal airspace disease, pleural fluid or pneumothorax. No acute osseous abnormalities. IMPRESSION: Mild cardiomegaly.  No congestive failure or localizing process. Electronically Signed   By: Rubye Oaks M.D.   On: 11/07/2016 21:07    Procedures Procedures (including critical care time)  Medications Ordered in ED Medications  ipratropium-albuterol (DUONEB) 0.5-2.5 (3) MG/3ML nebulizer solution 3 mL (3 mLs Nebulization Given 11/07/16 2338)  hydrALAZINE (APRESOLINE) tablet 25 mg (25 mg Oral Given 11/07/16 2338)  ketorolac (TORADOL) 15 MG/ML injection 15 mg (15 mg Intravenous Given 11/07/16  2345)  aspirin chewable tablet 324 mg (324 mg Oral Given 11/07/16 2338)     Initial Impression / Assessment and Plan / ED Course  I have reviewed the triage vital signs and the nursing notes.  Pertinent labs & imaging results that were available during my care of the patient were reviewed by me and considered in my medical decision making (see chart for details).     Pt comes in with cc of DIB, chest pain.  Chest pain is substernal, L sided. Pain is pleuritic and worse with cough and deep inspiration. Pt also has DIB that is positional and she has both orthopnea and PND that are new. Pt is not taking her meds and her BP. Her Cr is slightly bumped - but that might not be old.  Concerns are for HTN urgency / emergency with CHF. CXr doesn't show pulm edema - but it does seem like clinically there is CHF. Pt takes labetalol - and so I am not sure if that is the best agent to give her now given the CHF like symptoms.  Plan is to admit. Optimize BP. r/o CHF.  Final Clinical Impressions(s) / ED  Diagnoses   Final diagnoses:  Hypertensive emergency  Orthopnea  Paroxysmal nocturnal dyspnea    New Prescriptions Current Discharge Medication List     I personally performed the services described in this documentation, which was scribed in my presence. The recorded information has been reviewed and is accurate.     Derwood Kaplan, MD 11/08/16 [phone]

## 2016-11-07 NOTE — ED Triage Notes (Signed)
Patient reports central / left lower chest pain onset 2 days ago with SOB and productive cough , denies emesis or diaphoresis .

## 2016-11-08 ENCOUNTER — Encounter (HOSPITAL_COMMUNITY): Payer: Self-pay | Admitting: Internal Medicine

## 2016-11-08 ENCOUNTER — Observation Stay (HOSPITAL_COMMUNITY): Payer: Medicaid Other

## 2016-11-08 ENCOUNTER — Observation Stay (HOSPITAL_BASED_OUTPATIENT_CLINIC_OR_DEPARTMENT_OTHER): Payer: Medicaid Other

## 2016-11-08 DIAGNOSIS — Z8249 Family history of ischemic heart disease and other diseases of the circulatory system: Secondary | ICD-10-CM | POA: Diagnosis not present

## 2016-11-08 DIAGNOSIS — Z681 Body mass index (BMI) 19 or less, adult: Secondary | ICD-10-CM | POA: Diagnosis not present

## 2016-11-08 DIAGNOSIS — Z9119 Patient's noncompliance with other medical treatment and regimen: Secondary | ICD-10-CM | POA: Diagnosis not present

## 2016-11-08 DIAGNOSIS — F17210 Nicotine dependence, cigarettes, uncomplicated: Secondary | ICD-10-CM | POA: Diagnosis present

## 2016-11-08 DIAGNOSIS — K219 Gastro-esophageal reflux disease without esophagitis: Secondary | ICD-10-CM | POA: Diagnosis present

## 2016-11-08 DIAGNOSIS — R079 Chest pain, unspecified: Secondary | ICD-10-CM | POA: Diagnosis present

## 2016-11-08 DIAGNOSIS — I160 Hypertensive urgency: Secondary | ICD-10-CM | POA: Diagnosis present

## 2016-11-08 DIAGNOSIS — R636 Underweight: Secondary | ICD-10-CM | POA: Diagnosis present

## 2016-11-08 DIAGNOSIS — I161 Hypertensive emergency: Principal | ICD-10-CM

## 2016-11-08 DIAGNOSIS — R072 Precordial pain: Secondary | ICD-10-CM

## 2016-11-08 DIAGNOSIS — I5042 Chronic combined systolic (congestive) and diastolic (congestive) heart failure: Secondary | ICD-10-CM | POA: Diagnosis present

## 2016-11-08 DIAGNOSIS — J40 Bronchitis, not specified as acute or chronic: Secondary | ICD-10-CM | POA: Diagnosis present

## 2016-11-08 DIAGNOSIS — R0609 Other forms of dyspnea: Secondary | ICD-10-CM | POA: Diagnosis present

## 2016-11-08 DIAGNOSIS — D573 Sickle-cell trait: Secondary | ICD-10-CM | POA: Diagnosis present

## 2016-11-08 DIAGNOSIS — Z23 Encounter for immunization: Secondary | ICD-10-CM | POA: Diagnosis not present

## 2016-11-08 DIAGNOSIS — R51 Headache: Secondary | ICD-10-CM | POA: Diagnosis present

## 2016-11-08 DIAGNOSIS — I110 Hypertensive heart disease with heart failure: Secondary | ICD-10-CM | POA: Diagnosis present

## 2016-11-08 DIAGNOSIS — Z79899 Other long term (current) drug therapy: Secondary | ICD-10-CM | POA: Diagnosis not present

## 2016-11-08 LAB — CBC WITH DIFFERENTIAL/PLATELET
Basophils Absolute: 0.1 10*3/uL (ref 0.0–0.1)
Basophils Relative: 1 %
Eosinophils Absolute: 0.4 10*3/uL (ref 0.0–0.7)
Eosinophils Relative: 7 %
HCT: 36.5 % (ref 36.0–46.0)
Hemoglobin: 12 g/dL (ref 12.0–15.0)
Lymphocytes Relative: 39 %
Lymphs Abs: 2.5 10*3/uL (ref 0.7–4.0)
MCH: 27.8 pg (ref 26.0–34.0)
MCHC: 32.9 g/dL (ref 30.0–36.0)
MCV: 84.5 fL (ref 78.0–100.0)
Monocytes Absolute: 0.4 10*3/uL (ref 0.1–1.0)
Monocytes Relative: 6 %
Neutro Abs: 3 10*3/uL (ref 1.7–7.7)
Neutrophils Relative %: 47 %
Platelets: 319 10*3/uL (ref 150–400)
RBC: 4.32 MIL/uL (ref 3.87–5.11)
RDW: 13.8 % (ref 11.5–15.5)
WBC: 6.4 10*3/uL (ref 4.0–10.5)

## 2016-11-08 LAB — HIV ANTIBODY (ROUTINE TESTING W REFLEX): HIV Screen 4th Generation wRfx: NONREACTIVE

## 2016-11-08 LAB — COMPREHENSIVE METABOLIC PANEL
ALT: 26 U/L (ref 14–54)
AST: 26 U/L (ref 15–41)
Albumin: 3.8 g/dL (ref 3.5–5.0)
Alkaline Phosphatase: 69 U/L (ref 38–126)
Anion gap: 10 (ref 5–15)
BUN: 9 mg/dL (ref 6–20)
CO2: 22 mmol/L (ref 22–32)
Calcium: 8.7 mg/dL — ABNORMAL LOW (ref 8.9–10.3)
Chloride: 104 mmol/L (ref 101–111)
Creatinine, Ser: 0.75 mg/dL (ref 0.44–1.00)
GFR calc Af Amer: >60 mL/min (ref >60)
GFR calc non Af Amer: >60 mL/min (ref >60)
Glucose, Bld: 94 mg/dL (ref 65–99)
Potassium: 3.2 mmol/L — ABNORMAL LOW (ref 3.5–5.1)
Sodium: 136 mmol/L (ref 135–145)
Total Bilirubin: 0.7 mg/dL (ref 0.3–1.2)
Total Protein: 6.8 g/dL (ref 6.5–8.1)

## 2016-11-08 LAB — RAPID URINE DRUG SCREEN, HOSP PERFORMED
Amphetamines: NOT DETECTED
Barbiturates: NOT DETECTED
Benzodiazepines: NOT DETECTED
Cocaine: NOT DETECTED
Opiates: NOT DETECTED
Tetrahydrocannabinol: NOT DETECTED

## 2016-11-08 LAB — I-STAT TROPONIN, ED: Troponin i, poc: 0.01 ng/mL (ref 0.00–0.08)

## 2016-11-08 LAB — ECHOCARDIOGRAM COMPLETE
Height: 64 in
Weight: 1750.4 oz

## 2016-11-08 LAB — TROPONIN I
Troponin I: <0.03 ng/mL (ref <0.03)
Troponin I: <0.03 ng/mL (ref <0.03)
Troponin I: <0.03 ng/mL (ref <0.03)

## 2016-11-08 LAB — BRAIN NATRIURETIC PEPTIDE: B Natriuretic Peptide: 20.7 pg/mL (ref 0.0–100.0)

## 2016-11-08 LAB — TSH: TSH: 1.712 u[IU]/mL (ref 0.350–4.500)

## 2016-11-08 LAB — PREGNANCY, URINE: Preg Test, Ur: NEGATIVE

## 2016-11-08 LAB — MRSA PCR SCREENING: MRSA by PCR: NEGATIVE

## 2016-11-08 MED ORDER — FUROSEMIDE 10 MG/ML IJ SOLN
20.0000 mg | Freq: Once | INTRAMUSCULAR | Status: AC
  Administered 2016-11-08: 20 mg via INTRAVENOUS
  Filled 2016-11-08: qty 2

## 2016-11-08 MED ORDER — ALBUTEROL SULFATE (2.5 MG/3ML) 0.083% IN NEBU: 2.5000 mg | INHALATION_SOLUTION | RESPIRATORY_TRACT | Status: DC | PRN

## 2016-11-08 MED ORDER — IOPAMIDOL (ISOVUE-370) INJECTION 76%
INTRAVENOUS | Status: AC
  Administered 2016-11-08: 80 mL
  Filled 2016-11-08: qty 100

## 2016-11-08 MED ORDER — MORPHINE SULFATE (PF) 2 MG/ML IV SOLN
2.0000 mg | Freq: Once | INTRAVENOUS | Status: AC
  Administered 2016-11-08: 2 mg via INTRAVENOUS
  Filled 2016-11-08: qty 1

## 2016-11-08 MED ORDER — NITROGLYCERIN IN D5W 200-5 MCG/ML-% IV SOLN
0.0000 ug/min | INTRAVENOUS | Status: DC
  Administered 2016-11-08: 5 ug/min via INTRAVENOUS

## 2016-11-08 MED ORDER — BUDESONIDE 0.25 MG/2ML IN SUSP
0.2500 mg | Freq: Two times a day (BID) | RESPIRATORY_TRACT | Status: DC
  Administered 2016-11-08 – 2016-11-09 (×3): 0.25 mg via RESPIRATORY_TRACT
  Filled 2016-11-08 (×3): qty 2

## 2016-11-08 MED ORDER — ASPIRIN EC 81 MG PO TBEC
81.0000 mg | DELAYED_RELEASE_TABLET | Freq: Every day | ORAL | Status: DC
  Administered 2016-11-08 – 2016-11-09 (×2): 81 mg via ORAL
  Filled 2016-11-08 (×2): qty 1

## 2016-11-08 MED ORDER — NITROGLYCERIN IN D5W 200-5 MCG/ML-% IV SOLN
INTRAVENOUS | Status: AC
Start: 2016-11-08 — End: 2016-11-08
  Filled 2016-11-08: qty 250

## 2016-11-08 MED ORDER — PNEUMOCOCCAL VAC POLYVALENT 25 MCG/0.5ML IJ INJ
0.5000 mL | INJECTION | INTRAMUSCULAR | Status: AC
  Administered 2016-11-09: 0.5 mL via INTRAMUSCULAR
  Filled 2016-11-08: qty 0.5

## 2016-11-08 MED ORDER — ALBUTEROL SULFATE (2.5 MG/3ML) 0.083% IN NEBU
2.5000 mg | INHALATION_SOLUTION | Freq: Three times a day (TID) | RESPIRATORY_TRACT | Status: DC
  Administered 2016-11-08 – 2016-11-09 (×3): 2.5 mg via RESPIRATORY_TRACT
  Filled 2016-11-08 (×3): qty 3

## 2016-11-08 MED ORDER — ACETAMINOPHEN 325 MG PO TABS
650.0000 mg | ORAL_TABLET | Freq: Four times a day (QID) | ORAL | Status: DC | PRN
  Administered 2016-11-08 (×2): 650 mg via ORAL
  Filled 2016-11-08 (×2): qty 2

## 2016-11-08 MED ORDER — MORPHINE SULFATE (PF) 4 MG/ML IV SOLN
4.0000 mg | Freq: Once | INTRAVENOUS | Status: AC
  Administered 2016-11-08: 4 mg via INTRAVENOUS
  Filled 2016-11-08: qty 1

## 2016-11-08 MED ORDER — ALBUTEROL SULFATE (2.5 MG/3ML) 0.083% IN NEBU
2.5000 mg | INHALATION_SOLUTION | RESPIRATORY_TRACT | Status: DC
  Administered 2016-11-08: 2.5 mg via RESPIRATORY_TRACT
  Filled 2016-11-08: qty 3

## 2016-11-08 MED ORDER — ONDANSETRON HCL 4 MG/2ML IJ SOLN: 4.0000 mg | Freq: Four times a day (QID) | INTRAMUSCULAR | Status: DC | PRN

## 2016-11-08 MED ORDER — POTASSIUM CHLORIDE CRYS ER 20 MEQ PO TBCR
40.0000 meq | EXTENDED_RELEASE_TABLET | Freq: Once | ORAL | Status: AC
  Administered 2016-11-08: 40 meq via ORAL
  Filled 2016-11-08: qty 2

## 2016-11-08 MED ORDER — GUAIFENESIN ER 600 MG PO TB12
600.0000 mg | ORAL_TABLET | Freq: Two times a day (BID) | ORAL | Status: DC
  Administered 2016-11-08 – 2016-11-09 (×3): 600 mg via ORAL
  Filled 2016-11-08 (×3): qty 1

## 2016-11-08 MED ORDER — AMLODIPINE BESYLATE 10 MG PO TABS
10.0000 mg | ORAL_TABLET | Freq: Every day | ORAL | Status: DC
  Administered 2016-11-09: 10 mg via ORAL
  Filled 2016-11-08: qty 1

## 2016-11-08 MED ORDER — HYDROCHLOROTHIAZIDE 25 MG PO TABS
25.0000 mg | ORAL_TABLET | Freq: Every day | ORAL | Status: DC
  Administered 2016-11-08 – 2016-11-09 (×2): 25 mg via ORAL
  Filled 2016-11-08 (×2): qty 1

## 2016-11-08 MED ORDER — ONDANSETRON HCL 4 MG PO TABS: 4.0000 mg | ORAL_TABLET | Freq: Four times a day (QID) | ORAL | Status: DC | PRN

## 2016-11-08 MED ORDER — TRAMADOL HCL 50 MG PO TABS
50.0000 mg | ORAL_TABLET | Freq: Once | ORAL | Status: AC
  Administered 2016-11-08: 50 mg via ORAL
  Filled 2016-11-08: qty 1

## 2016-11-08 MED ORDER — AMLODIPINE BESYLATE 5 MG PO TABS
5.0000 mg | ORAL_TABLET | Freq: Every day | ORAL | Status: DC
  Administered 2016-11-08: 5 mg via ORAL
  Filled 2016-11-08 (×2): qty 1

## 2016-11-08 MED ORDER — ACETAMINOPHEN 650 MG RE SUPP: 650.0000 mg | Freq: Four times a day (QID) | RECTAL | Status: DC | PRN

## 2016-11-08 MED ORDER — LABETALOL HCL 200 MG PO TABS
300.0000 mg | ORAL_TABLET | Freq: Two times a day (BID) | ORAL | Status: DC
  Administered 2016-11-08 – 2016-11-09 (×3): 300 mg via ORAL
  Filled 2016-11-08 (×3): qty 1

## 2016-11-08 NOTE — Progress Notes (Signed)
Patient admitted after midnight-- please see H&P.  Was previously on HCTZ/labetalol for HTN but has not taken since lost medicaid.  Resume meds- add norvasc.  Echo pending.  Seen by cards, no further invention in hospital.  CTA negative for PE/no PNA-- wean O2 as tolerated.  Marlin CanaryJessica Vann DO

## 2016-11-08 NOTE — Progress Notes (Signed)
Chaplain presented to the patient, introduced self, and informed her of the reason for the visit, which was information for an Advance Directive, and to inquire of her need to complete one  while she was here at the hospital.  The patient stated she did not want to complete it at this time; however Chaplain suggested she read over it before she makes her final decision, the AD was left for review with directives to inform the Nursing Staff if sh desired to complete it.  Chaplain offered a prayer of healing and wholeness.  Chaplain Janell QuietAudrey Alegra Rost [phone]

## 2016-11-08 NOTE — Care Management Note (Addendum)
Case Management Note  Patient Details  Name: [name] MRN: [id number] Date of Birth: 12/08/1975  Subjective/Objective: Pt presented for chest pain. Pt is without PCP and Insurance at this time.                    Action/Plan: CM did schedule an appointment for hospital follow up and placed on AVS. Pt is aware. [facility] with medication ranging in cost from $4.00-$10.00. No further needs at this time.   Expected Discharge Date:                  Expected Discharge Plan:  Home/Self Care  In-House Referral:  NA  Discharge planning Services  CM Consult, Follow-up appt scheduled, [facility], Medication Assistance  Post Acute Care Choice:  NA Choice offered to:  NA  DME Arranged:  N/A DME Agency:  NA  HH Arranged:  NA HH Agency:  NA  Status of Service:  Completed, signed off  If discussed at Long Length of Stay Meetings, dates discussed:    Additional Comments:  Gala LewandowskyGraves-Bigelow, Brittany Arruda Kaye, RN 11/08/2016, 3:09 PM

## 2016-11-08 NOTE — H&P (Signed)
History and Physical    [name]:[id number] DOB: [birth year] DOA: 11/07/2016  PCP: Patient, No Pcp Per  Patient coming from: Home.  Chief Complaint: Chest pain.  HPI: [name] is a 41 y.o. female with no significant past medical history and history of tobacco abuse presents to the ER with complaints of chest pain. Patient states over the last 1 month patient has been having progressively retrosternal chest pressure increases on exertion with shortness of breath. Has been having some productive cough. Denies any fever or chills. Patient denies any radiation of the chest pain.  ED Course: In the ER patient was found to have markedly elevated blood pressure and on exam patient has bilateral coarse crepitations. EKG was showing normal sinus rhythm. Chest x-ray was unremarkable. Patient was given oral hydralazine and aspirin and admitted for hypertensive urgency and chest pain.  Review of Systems: As per HPI, rest all negative.   Past Medical History:  Diagnosis Date  . Anemia   . GERD (gastroesophageal reflux disease)   . Hypertension   . Miscarriage    x3  . Sickle cell trait [facility])     Past Surgical History:  Procedure Laterality Date  . BREAST SURGERY     cyst removal 1990  . DILATION AND CURETTAGE OF UTERUS     four miscarriages  . TUBAL LIGATION Bilateral 08/17/2012   Procedure: POST PARTUM TUBAL LIGATION;  Surgeon: Allie Bossier, MD;  Location: [facility];  Service: Gynecology;  Laterality: Bilateral;     reports that she has quit smoking. Her smoking use included Cigarettes. She has a 2.50 pack-year smoking history. She has never used smokeless tobacco. She reports that she does not drink alcohol or use drugs.  No Known Allergies  Family History  Problem Relation Age of Onset  . Hypertension Mother   . Hypertension Maternal Grandmother   . Hypertension Maternal Grandfather   . Other Neg Hx     Prior to Admission medications   Medication Sig Start Date End  Date Taking? Authorizing Provider  hydrochlorothiazide (HYDRODIURIL) 25 MG tablet Take 1 tablet (25 mg total) by mouth daily. Patient not taking: Reported on 11/08/2016 10/09/14   Marisa Severin, MD  labetalol (NORMODYNE) 300 MG tablet Take 2 tablets (600 mg) 2 times per day. Patient not taking: Reported on 11/08/2016 10/09/14   Marisa Severin, MD  lansoprazole (PREVACID) 30 MG capsule Take 1 capsule (30 mg total) by mouth daily. Patient not taking: Reported on 10/08/2014 09/25/12   Archie Patten, CNM  methocarbamol (ROBAXIN-750) 750 MG tablet Take 1 tablet (750 mg total) by mouth 4 (four) times daily. Patient not taking: Reported on 11/08/2016 10/09/14   Marisa Severin, MD  oxyCODONE-acetaminophen (PERCOCET/ROXICET) 5-325 MG per tablet Take 1-2 tablets by mouth every 4 (four) hours as needed. Patient not taking: Reported on 10/08/2014 08/18/12   Allie Bossier, MD    Physical Exam: Vitals:   11/07/16 2330 11/08/16 0000 11/08/16 0015 11/08/16 0112  BP: (!) 179/119 (!) 187/117 (!) 182/116   Pulse: 97 93 95 (!) 101  Resp: (!) 26 (!) 21 (!) 26   Temp:    97.8 F (36.6 C)  TempSrc:    Oral  SpO2: 98% 99% 100% 98%  Weight:    49.6 kg (109 lb 6.4 oz)  Height:    5\' 4"  (1.626 m)      Constitutional: Moderately built and nourished. Vitals:   11/07/16 2330 11/08/16 0000 11/08/16 0015 11/08/16 0112  BP: (!) 179/119 (!) 187/117 (!) 182/116   Pulse: 97 93 95 (!) 101  Resp: (!) 26 (!) 21 (!) 26   Temp:    97.8 F (36.6 C)  TempSrc:    Oral  SpO2: 98% 99% 100% 98%  Weight:    49.6 kg (109 lb 6.4 oz)  Height:    5\' 4"  (1.626 m)   Eyes: Anicteric no pallor. ENMT: No discharge from the ears eyes nose or mouth. Neck: No JVD elevated no mass felt. Respiratory: Bilateral coarse crepitations. No rhonchi. Cardiovascular: S1-S2. No murmurs appreciated. Abdomen: Soft nontender bowel sounds present. Musculoskeletal: No edema. No joint effusion. Skin: No rash. Skin appears warm. Neurologic: Alert awake oriented to  time place and person. Moves all extremities. Psychiatric: Appears normal. Normal affect.   Labs on Admission: I have personally reviewed following labs and imaging studies  CBC:  Recent Labs Lab 11/07/16 2011  WBC 7.2  HGB 13.4  HCT 39.3  MCV 84.9  PLT 333   Basic Metabolic Panel:  Recent Labs Lab 11/07/16 2011  NA 136  K 3.7  CL 105  CO2 21*  GLUCOSE 101*  BUN 13  CREATININE 1.14*  CALCIUM 9.4   GFR: Estimated Creatinine Clearance: 52.4 mL/min (A) (by C-G formula based on SCr of 1.14 mg/dL (H)). Liver Function Tests: No results for input(s): AST, ALT, ALKPHOS, BILITOT, PROT, ALBUMIN in the last 168 hours. No results for input(s): LIPASE, AMYLASE in the last 168 hours. No results for input(s): AMMONIA in the last 168 hours. Coagulation Profile: No results for input(s): INR, PROTIME in the last 168 hours. Cardiac Enzymes: No results for input(s): CKTOTAL, CKMB, CKMBINDEX, TROPONINI in the last 168 hours. BNP (last 3 results) No results for input(s): PROBNP in the last 8760 hours. HbA1C: No results for input(s): HGBA1C in the last 72 hours. CBG: No results for input(s): GLUCAP in the last 168 hours. Lipid Profile: No results for input(s): CHOL, HDL, LDLCALC, TRIG, CHOLHDL, LDLDIRECT in the last 72 hours. Thyroid Function Tests: No results for input(s): TSH, T4TOTAL, FREET4, T3FREE, THYROIDAB in the last 72 hours. Anemia Panel: No results for input(s): VITAMINB12, FOLATE, FERRITIN, TIBC, IRON, RETICCTPCT in the last 72 hours. Urine analysis:    Component Value Date/Time   COLORURINE YELLOW 03/01/2012 1159   APPEARANCEUR CLEAR 03/01/2012 1159   LABSPEC 1.015 08/12/2012 1004   PHURINE 7.0 08/12/2012 1004   GLUCOSEU NEGATIVE 08/12/2012 1004   HGBUR NEGATIVE 08/12/2012 1004   BILIRUBINUR NEGATIVE 08/12/2012 1004   KETONESUR NEGATIVE 08/12/2012 1004   PROTEINUR NEGATIVE 08/12/2012 1004   UROBILINOGEN 0.2 08/12/2012 1004   NITRITE NEGATIVE 08/12/2012 1004    LEUKOCYTESUR NEGATIVE 08/12/2012 1004   Sepsis Labs: @LABRCNTIP (procalcitonin:4,lacticidven:4) )No results found for this or any previous visit (from the past 240 hour(s)).   Radiological Exams on Admission: Dg Chest 2 View  Result Date: 11/07/2016 CLINICAL DATA:  Central chest pain.  Cough. EXAM: CHEST  2 VIEW COMPARISON:  10/08/2014 FINDINGS: Mild cardiomegaly. Mediastinal contours are unchanged. No pulmonary edema, focal airspace disease, pleural fluid or pneumothorax. No acute osseous abnormalities. IMPRESSION: Mild cardiomegaly.  No congestive failure or localizing process. Electronically Signed   By: Rubye Oaks M.D.   On: 11/07/2016 21:07    EKG: Independently reviewed. Normal sinus rhythm.  Assessment/Plan Principal Problem:   Chest pain Active Problems:   Hypertensive urgency    1. Chest pain - patient has exertional chest pain. Patient blood pressure is also markedly elevated. Concerning for angina  at this time. We will cycle cardiac markers check CT angiogram of the chest check 2-D echo aspirin. Consult cardiologist in a.m. 2. Hypertensive urgency - I have started patient on IV nitroglycerin infusion. I have ordered 1 dose of Lasix 20 mg IV. Will place patient on Norvasc 5 mg. Closely follow blood pressure trends. 3. Shortness of breath - check 2-D echo. Patient has bilateral coarse crepitations. I have placed patient on nebulizer treatment along with Pulmicort. One dose of Lasix was given. 4. Tobacco abuse - tobacco cessation counseling requested.   DVT prophylaxis: SCDs. Code Status: Full code.  Family Communication: Discussed with patient.  Disposition Plan: Home.  Consults called: None.  Admission status: Observation.    Eduard ClosKAKRAKANDY,Kadiatou Oplinger N. MD [facility] Pager [phone].  If 7PM-7AM, please contact night-coverage [email or web address] Password [email or web address]  11/08/2016, 1:20 AM

## 2016-11-08 NOTE — Progress Notes (Signed)
  Echocardiogram 2D Echocardiogram has been performed.  [name], [name] 11/08/2016, 11:45 AM

## 2016-11-08 NOTE — Consult Note (Signed)
Cardiology Consultation:   Patient ID: [name]; [id number]; 10/07/1975   Admit date: 11/07/2016 Date of Consult: 11/08/2016  Primary Care Provider: Patient, No Pcp Per Primary Cardiologist: New/[facility] Primary Electrophysiologist:  None   Patient Profile:   Brittany DownerCrystal L Jahr is a 41 y.o. female with a hx of HTN who is being seen today for the evaluation of chest pain  at the request of Dr Benjamine MolaVann.  History of Present Illness:   [name] 41 y.o. female. On medicaid but unable to get HTN meds. Admitted with atypical chest pain hypertensive urgency and headache. No history of vascular disease She has been out of her BP meds for months Sick last 2 days. Some dyspnea and cough SSCP worse in recumbent.position  Somewhat pleuritic Bronchitic symptoms with phlegm She is a former smoker and denies drugs. Diet high in salt. Sickle cell trait but has not had frequent SSCP due to this. This am major complaint is headache.    Past Medical History:  Diagnosis Date  . Anemia   . GERD (gastroesophageal reflux disease)   . Hypertension   . Miscarriage    x3  . Sickle cell trait [facility])     Past Surgical History:  Procedure Laterality Date  . BREAST SURGERY     cyst removal 1990  . DILATION AND CURETTAGE OF UTERUS     four miscarriages  . TUBAL LIGATION Bilateral 08/17/2012   Procedure: POST PARTUM TUBAL LIGATION;  Surgeon: Allie BossierMyra C Dove, MD;  Location: [facility];  Service: Gynecology;  Laterality: Bilateral;     Inpatient Medications: Scheduled Meds: . albuterol  2.5 mg Nebulization TID  . amLODipine  5 mg Oral Daily  . aspirin EC  81 mg Oral Daily  . budesonide (PULMICORT) nebulizer solution  0.25 mg Nebulization BID  . [START ON 11/09/2016] pneumococcal 23 valent vaccine  0.5 mL Intramuscular Tomorrow-1000   Continuous Infusions: . nitroGLYCERIN 30 mcg/min (11/08/16 0220)   PRN Meds: acetaminophen **OR** acetaminophen, albuterol, ondansetron **OR** ondansetron (ZOFRAN)  IV  Allergies:   No Known Allergies  Social History:   Social History   Social History  . Marital status: Single    Spouse name: N/A  . Number of children: N/A  . Years of education: N/A   Occupational History  . Not on file.   Social History Main Topics  . Smoking status: Former Smoker    Packs/day: 0.25    Years: 10.00    Types: Cigarettes  . Smokeless tobacco: Never Used  . Alcohol use No  . Drug use: No  . Sexual activity: Yes    Birth control/ protection: None   Other Topics Concern  . Not on file   Social History Narrative  . No narrative on file    Family History:   The patient's family history includes Hypertension in her maternal grandfather, maternal grandmother, and mother. There is no history of Other.  ROS:  Please see the history of present illness.  D All other ROS reviewed and negative.     Physical Exam/Data:   Vitals:   11/08/16 0738 11/08/16 0810 11/08/16 0812 11/08/16 0815  BP: (!) 145/101 (!) 145/102  (!) 149/106  Pulse: 62 89  76  Resp: 13 (!) 23  (!) 28  Temp: 98.2 F (36.8 C)     TempSrc: Oral     SpO2: 100% 100% 100% 100%  Weight:      Height:        Intake/Output Summary (  Last 24 hours) at 11/08/16 0830 Last data filed at 11/08/16 0300  Gross per 24 hour  Intake           249.23 ml  Output              600 ml  Net          -350.77 ml   Filed Weights   11/08/16 0112  Weight: 109 lb 6.4 oz (49.6 kg)   Body mass index is 18.78 kg/m.  General:  Black female mild distress due to headache  HEENT: normal Lymph: no adenopathy Neck: no JVD Endocrine:  No thryomegaly Vascular: No carotid bruits; FA pulses 2+ bilaterally without bruits  Cardiac:  normal S1, S2; RRR; no murmur   Lungs:  Raspy with mild exp  wheezing, rhonchi or rales  Abd: soft, nontender, no hepatomegaly  Ext: no edema Musculoskeletal:  No deformities, BUE and BLE strength normal and equal Skin: warm and dry  Neuro:  CNs 2-12 intact, no focal abnormalities  noted Psych:  Normal affect    EKG:  NSR rate 93 normal   Relevant CV Studies: None  Laboratory Data:  Chemistry Recent Labs Lab 11/07/16 2011 11/08/16 0546  NA 136 136  K 3.7 3.2*  CL 105 104  CO2 21* 22  GLUCOSE 101* 94  BUN 13 9  CREATININE 1.14* 0.75  CALCIUM 9.4 8.7*  GFRNONAA >60 >60  GFRAA >60 >60  ANIONGAP 10 10     Recent Labs Lab 11/08/16 0546  PROT 6.8  ALBUMIN 3.8  AST 26  ALT 26  ALKPHOS 69  BILITOT 0.7   Hematology Recent Labs Lab 11/07/16 2011 11/08/16 0546  WBC 7.2 6.4  RBC 4.63 4.32  HGB 13.4 12.0  HCT 39.3 36.5  MCV 84.9 84.5  MCH 28.9 27.8  MCHC 34.1 32.9  RDW 13.7 13.8  PLT 333 319   Cardiac Enzymes Recent Labs Lab 11/08/16 0215 11/08/16 0546  TROPONINI <0.03 <0.03    Recent Labs Lab 11/07/16 2025 11/08/16 0021  TROPIPOC 0.01 0.01    BNP Recent Labs Lab 11/08/16 0011  BNP 20.7    DDimer No results for input(s): DDIMER in the last 168 hours.  Radiology/Studies:  Dg Chest 2 View  Result Date: 11/07/2016 CLINICAL DATA:  Central chest pain.  Cough. EXAM: CHEST  2 VIEW COMPARISON:  10/08/2014 FINDINGS: Mild cardiomegaly. Mediastinal contours are unchanged. No pulmonary edema, focal airspace disease, pleural fluid or pneumothorax. No acute osseous abnormalities. IMPRESSION: Mild cardiomegaly.  No congestive failure or localizing process. Electronically Signed   By: Rubye Oaks M.D.   On: 11/07/2016 21:07   Ct Angio Chest Pe W Or Wo Contrast  Result Date: 11/08/2016 CLINICAL DATA:  Increasing shortness of breath and chest pain for a few days. EXAM: CT ANGIOGRAPHY CHEST WITH CONTRAST TECHNIQUE: Multidetector CT imaging of the chest was performed using the standard protocol during bolus administration of intravenous contrast. Multiplanar CT image reconstructions and MIPs were obtained to evaluate the vascular anatomy. CONTRAST:  75 mL Isovue 370 COMPARISON:  None. FINDINGS: Cardiovascular: Satisfactory opacification of  the pulmonary arteries to the segmental level. No evidence of pulmonary embolism. Normal heart size. No pericardial effusion. Mediastinum/Nodes: No enlarged mediastinal, hilar, or axillary lymph nodes. Thyroid gland, trachea, and esophagus demonstrate no significant findings. Lungs/Pleura: Lungs are clear. No pleural effusion or pneumothorax. Upper Abdomen: No acute abnormality. Musculoskeletal: No chest wall abnormality. No acute or significant osseous findings. Review of the MIP images confirms the  above findings. IMPRESSION: No evidence of significant pulmonary embolus. No evidence of active pulmonary disease. Electronically Signed   By: Burman Nieves M.D.   On: 11/08/2016 05:13    Assessment and Plan:   1. Chest Pain:  Atypical likely related to hypertension CXR normal mediastinum r/o ECG normal No indication for further stress testing. Echo ordered by primary service  2. HTN: related to non compliance Rx per primary service 3. Headache : no focal signs Rx tylenol don't think she needs a head CT 4. Bronchitis:  Abnormal exam CXR and CTA ok ? Z pack  Continue nebs   Signed, Charlton Haws, MD  11/08/2016 8:30 AM

## 2016-11-09 ENCOUNTER — Encounter (HOSPITAL_COMMUNITY): Payer: Self-pay | Admitting: Family Medicine

## 2016-11-09 DIAGNOSIS — I160 Hypertensive urgency: Secondary | ICD-10-CM

## 2016-11-09 DIAGNOSIS — I5042 Chronic combined systolic (congestive) and diastolic (congestive) heart failure: Secondary | ICD-10-CM | POA: Diagnosis present

## 2016-11-09 LAB — CBC
HCT: 35.4 % — ABNORMAL LOW (ref 36.0–46.0)
Hemoglobin: 11.7 g/dL — ABNORMAL LOW (ref 12.0–15.0)
MCH: 27.9 pg (ref 26.0–34.0)
MCHC: 33.1 g/dL (ref 30.0–36.0)
MCV: 84.3 fL (ref 78.0–100.0)
Platelets: 292 10*3/uL (ref 150–400)
RBC: 4.2 MIL/uL (ref 3.87–5.11)
RDW: 13.7 % (ref 11.5–15.5)
WBC: 5.6 10*3/uL (ref 4.0–10.5)

## 2016-11-09 LAB — BASIC METABOLIC PANEL
Anion gap: 9 (ref 5–15)
BUN: 8 mg/dL (ref 6–20)
CO2: 23 mmol/L (ref 22–32)
Calcium: 9.2 mg/dL (ref 8.9–10.3)
Chloride: 103 mmol/L (ref 101–111)
Creatinine, Ser: 0.61 mg/dL (ref 0.44–1.00)
GFR calc Af Amer: >60 mL/min (ref >60)
GFR calc non Af Amer: >60 mL/min (ref >60)
Glucose, Bld: 114 mg/dL — ABNORMAL HIGH (ref 65–99)
Potassium: 3.8 mmol/L (ref 3.5–5.1)
Sodium: 135 mmol/L (ref 135–145)

## 2016-11-09 MED ORDER — LABETALOL HCL 300 MG PO TABS: 300.0000 mg | ORAL_TABLET | Freq: Two times a day (BID) | ORAL | 0 refills | Status: DC

## 2016-11-09 MED ORDER — GUAIFENESIN ER 600 MG PO TB12: 600.0000 mg | ORAL_TABLET | Freq: Two times a day (BID) | ORAL | 0 refills | Status: AC

## 2016-11-09 MED ORDER — ASPIRIN 81 MG PO TBEC: 81.0000 mg | DELAYED_RELEASE_TABLET | Freq: Every day | ORAL | 0 refills | Status: AC

## 2016-11-09 MED ORDER — ALBUTEROL SULFATE HFA 108 (90 BASE) MCG/ACT IN AERS: 2.0000 | INHALATION_SPRAY | RESPIRATORY_TRACT | 0 refills | Status: DC | PRN

## 2016-11-09 MED ORDER — LOSARTAN POTASSIUM 100 MG PO TABS: 100.0000 mg | ORAL_TABLET | Freq: Every day | ORAL | 0 refills | Status: DC

## 2016-11-09 MED FILL — LOSARTAN POTASSIUM 100 MG T: 100 | 30 days supply | Qty: 30 | Fill #0

## 2016-11-09 MED FILL — LABETALOL HCL 300 MG TABLET: 300 | 30 days supply | Qty: 60 | Fill #0

## 2016-11-09 NOTE — Progress Notes (Signed)
Underweight.    ?

## 2016-11-09 NOTE — Discharge Instructions (Signed)
Please take your medications as prescribed and please follow up with the primary care clinic.   Return if symptoms recur, worsen or new problems develop.

## 2016-11-09 NOTE — Discharge Summary (Signed)
Physician Discharge Summary  FELESHIA ZUNDEL Schneider:[id number] DOB: 03-02-1976 DOA: 11/07/2016  PCP: [facility]   Admit date: 11/07/2016 Discharge date: 11/09/2016  Admitted From:  HOME Disposition:  HOME   Recommendations for Outpatient Follow-up:  1. Follow up with PCP in 5-7 days for recheck 2. Please obtain BMP/CBC in one week  Discharge Condition: STABLE  CODE STATUS: FULL  Diet recommendation: Heart Healthy  Brief/Interim Summary: HPI: [name] is a 41 y.o. female with no significant past medical history and history of tobacco abuse presents to the ER with complaints of chest pain. Patient states over the last 1 month patient has been having progressively retrosternal chest pressure increases on exertion with shortness of breath. Has been having some productive cough. Denies any fever or chills. Patient denies any radiation of the chest pain.  ED Course: In the ER patient was found to have markedly elevated blood pressure and on exam patient has bilateral coarse crepitations. EKG was showing normal sinus rhythm. Chest x-ray was unremarkable. Patient was given oral hydralazine and aspirin and admitted for hypertensive urgency and chest pain.  1. Chest Pain:  Atypical likely related to hypertension CXR normal mediastinum r/o ECG normal.  No indication for further stress testing per cardiology services.  Echo ordered and revealed systolic dysfunction EF 35-40%.    2. HTN: related to non compliance: Cardiology recommending Cozaar 100 mg plus Normadyne 300 BID, follow up with cardiology in office.  Arrangements made for patient to receive meds at [facility] and to be seen at [facility] for primary care services.  Appointment made for patient already.   3. Headache : Resolved now.   4. Bronchitis:  Abnormal exam CXR and CTA ok  Discharge on albuterol HFA, Mucinex  5. CHF chronic combined systolic and diastolic:  Discussed with Dr Eden Emms, Pt  should follow up outpatient, not acutely decompensated.  Echo - Left ventricle: The cavity size was normal. Wall thickness was  increased in a pattern of mild LVH. Systolic function was moderately reduced. The estimated ejection fraction was in the  range of 35% to 40%. Features are consistent with a pseudonormal   left ventricular filling pattern, with concomitant abnormal relaxation and increased filling pressure (grade 2 diastolic  dysfunction). - Right ventricle: Systolic function was mildly reduced.  Discharge Diagnoses:  Principal Problem:   Chest pain Active Problems:   Hypertensive urgency   Chronic combined systolic and diastolic congestive heart failure [facility])  Discharge Instructions  Discharge Instructions    Increase activity slowly    Complete by:  As directed      Allergies as of 11/09/2016   No Known Allergies     Medication List    STOP taking these medications   hydrochlorothiazide 25 MG tablet Commonly known as:  HYDRODIURIL   lansoprazole 30 MG capsule Commonly known as:  PREVACID   methocarbamol 750 MG tablet Commonly known as:  ROBAXIN-750   oxyCODONE-acetaminophen 5-325 MG tablet Commonly known as:  PERCOCET/ROXICET     TAKE these medications   albuterol 108 (90 Base) MCG/ACT inhaler Commonly known as:  PROVENTIL HFA;VENTOLIN HFA Inhale 2 puffs into the lungs every 4 (four) hours as needed for wheezing or shortness of breath (cough, shortness of breath or wheezing.).   aspirin 81 MG EC tablet Take 1 tablet (81 mg total) by mouth daily. Start taking on:  11/10/2016   guaiFENesin 600 MG 12 hr tablet Commonly known as:  MUCINEX Take 1  tablet (600 mg total) by mouth 2 (two) times daily.   labetalol 300 MG tablet Commonly known as:  NORMODYNE Take 1 tablet (300 mg total) by mouth 2 (two) times daily. What changed:  how much to take  how to take this  when to take this  additional instructions   losartan 100 MG tablet Commonly known as:   COZAAR Take 1 tablet (100 mg total) by mouth daily.      Follow-up Information    [facility] Follow up on 11/14/2016.   Why:  @ 2:00 pm for Hospital Follow Up with Joaquin CourtsKimberly Harris.  Contact information: [address] [place] [phone]       [facility] Follow up.   Why:  Pt can utilize the pharmacy onsite for medication assistance. Cost ranges from $4.00-$10.00.  Contact information: [address] [phone]       Wendall StadeNishan, Peter C, MD. Schedule an appointment as soon as possible for a visit in 3 week(s).   Specialty:  Cardiology Why:  Hospital Follow Up  Contact information: [address] [place] [address] [phone]          No Known Allergies  Procedures/Studies: Dg Chest 2 View  Result Date: 11/07/2016 CLINICAL DATA:  Central chest pain.  Cough. EXAM: CHEST  2 VIEW COMPARISON:  10/08/2014 FINDINGS: Mild cardiomegaly. Mediastinal contours are unchanged. No pulmonary edema, focal airspace disease, pleural fluid or pneumothorax. No acute osseous abnormalities. IMPRESSION: Mild cardiomegaly.  No congestive failure or localizing process. Electronically Signed   By: Rubye OaksMelanie  Ehinger M.D.   On: 11/07/2016 21:07   Ct Angio Chest Pe W Or Wo Contrast  Result Date: 11/08/2016 CLINICAL DATA:  Increasing shortness of breath and chest pain for a few days. EXAM: CT ANGIOGRAPHY CHEST WITH CONTRAST TECHNIQUE: Multidetector CT imaging of the chest was performed using the standard protocol during bolus administration of intravenous contrast. Multiplanar CT image reconstructions and MIPs were obtained to evaluate the vascular anatomy. CONTRAST:  75 mL Isovue 370 COMPARISON:  None. FINDINGS: Cardiovascular: Satisfactory opacification of the pulmonary arteries to the segmental level. No evidence of pulmonary embolism. Normal heart size. No pericardial  effusion. Mediastinum/Nodes: No enlarged mediastinal, hilar, or axillary lymph nodes. Thyroid gland, trachea, and esophagus demonstrate no significant findings. Lungs/Pleura: Lungs are clear. No pleural effusion or pneumothorax. Upper Abdomen: No acute abnormality. Musculoskeletal: No chest wall abnormality. No acute or significant osseous findings. Review of the MIP images confirms the above findings. IMPRESSION: No evidence of significant pulmonary embolus. No evidence of active pulmonary disease. Electronically Signed   By: Burman NievesWilliam  Stevens M.D.   On: 11/08/2016 05:13     Subjective: Pt without complaints, feeling much better today.    Discharge Exam: Vitals:   11/09/16 0728 11/09/16 0820  BP: (!) 138/99   Pulse: 76 91  Resp: (!) 22   Temp: 98.2 F (36.8 C)    Vitals:   11/09/16 0728 11/09/16 0820 11/09/16 0855 11/09/16 0856  BP: (!) 138/99     Pulse: 76 91    Resp: (!) 22     Temp: 98.2 F (36.8 C)     TempSrc: Oral     SpO2: 94%  95% 95%  Weight:      Height:       General: Pt is thin, alert, awake, not in acute distress Cardiovascular: RRR, S1/S2 +, no rubs, no gallops Respiratory:  CTA bilaterally, no wheezing, no rhonchi Abdominal: Soft, NT, ND, bowel sounds + Extremities: no edema, no cyanosis  The results of significant diagnostics from this hospitalization (including imaging, microbiology, ancillary and laboratory) are listed below for reference.     Microbiology: Recent Results (from the past 240 hour(s))  MRSA PCR Screening     Status: None   Collection Time: 11/08/16  1:35 AM  Result Value Ref Range Status   MRSA by PCR NEGATIVE NEGATIVE Final    Comment:        The GeneXpert MRSA Assay (FDA approved for NASAL specimens only), is one component of a comprehensive MRSA colonization surveillance program. It is not intended to diagnose MRSA infection nor to guide or monitor treatment for MRSA infections.      Labs: BNP (last 3 results)  Recent  Labs  11/08/16 0011  BNP 20.7   Basic Metabolic Panel:  Recent Labs Lab 11/07/16 2011 11/08/16 0546 11/09/16 0338  NA 136 136 135  K 3.7 3.2* 3.8  CL 105 104 103  CO2 21* 22 23  GLUCOSE 101* 94 114*  BUN 13 9 8   CREATININE 1.14* 0.75 0.61  CALCIUM 9.4 8.7* 9.2   Liver Function Tests:  Recent Labs Lab 11/08/16 0546  AST 26  ALT 26  ALKPHOS 69  BILITOT 0.7  PROT 6.8  ALBUMIN 3.8   No results for input(s): LIPASE, AMYLASE in the last 168 hours. No results for input(s): AMMONIA in the last 168 hours. CBC:  Recent Labs Lab 11/07/16 2011 11/08/16 0546 11/09/16 0338  WBC 7.2 6.4 5.6  NEUTROABS  --  3.0  --   HGB 13.4 12.0 11.7*  HCT 39.3 36.5 35.4*  MCV 84.9 84.5 84.3  PLT 333 319 292   Cardiac Enzymes:  Recent Labs Lab 11/08/16 0215 11/08/16 0546 11/08/16 1228  TROPONINI <0.03 <0.03 <0.03   BNP: Invalid input(s): POCBNP CBG: No results for input(s): GLUCAP in the last 168 hours. D-Dimer No results for input(s): DDIMER in the last 72 hours. Hgb A1c No results for input(s): HGBA1C in the last 72 hours. Lipid Profile No results for input(s): CHOL, HDL, LDLCALC, TRIG, CHOLHDL, LDLDIRECT in the last 72 hours. Thyroid function studies  Recent Labs  11/08/16 0215  TSH 1.712   Anemia work up No results for input(s): VITAMINB12, FOLATE, FERRITIN, TIBC, IRON, RETICCTPCT in the last 72 hours. Urinalysis    Component Value Date/Time   COLORURINE YELLOW 03/01/2012 1159   APPEARANCEUR CLEAR 03/01/2012 1159   LABSPEC 1.015 08/12/2012 1004   PHURINE 7.0 08/12/2012 1004   GLUCOSEU NEGATIVE 08/12/2012 1004   HGBUR NEGATIVE 08/12/2012 1004   BILIRUBINUR NEGATIVE 08/12/2012 1004   KETONESUR NEGATIVE 08/12/2012 1004   PROTEINUR NEGATIVE 08/12/2012 1004   UROBILINOGEN 0.2 08/12/2012 1004   NITRITE NEGATIVE 08/12/2012 1004   LEUKOCYTESUR NEGATIVE 08/12/2012 1004   Sepsis Labs Invalid input(s): PROCALCITONIN,  WBC,  LACTICIDVEN Microbiology Recent  Results (from the past 240 hour(s))  MRSA PCR Screening     Status: None   Collection Time: 11/08/16  1:35 AM  Result Value Ref Range Status   MRSA by PCR NEGATIVE NEGATIVE Final    Comment:        The GeneXpert MRSA Assay (FDA approved for NASAL specimens only), is one component of a comprehensive MRSA colonization surveillance program. It is not intended to diagnose MRSA infection nor to guide or monitor treatment for MRSA infections.    Time coordinating discharge: 31 minutes  SIGNED:  Standley Dakins, MD  [facility] 11/09/2016, 11:18 AM Pager [phone]  If 7PM-7AM, please contact night-coverage [email or web address] Password [email or web address]

## 2016-11-09 NOTE — Progress Notes (Signed)
Progress Note  Patient Name: [name] Date of Encounter: 11/09/2016  Primary Cardiologist: Eden EmmsNishan  Subjective   Less dyspnea no chest pain  Inpatient Medications    Scheduled Meds: . albuterol  2.5 mg Nebulization TID  . amLODipine  10 mg Oral Daily  . aspirin EC  81 mg Oral Daily  . budesonide (PULMICORT) nebulizer solution  0.25 mg Nebulization BID  . guaiFENesin  600 mg Oral BID  . hydrochlorothiazide  25 mg Oral Daily  . labetalol  300 mg Oral BID  . pneumococcal 23 valent vaccine  0.5 mL Intramuscular Tomorrow-1000   Continuous Infusions:  PRN Meds: acetaminophen **OR** acetaminophen, albuterol, ondansetron **OR** ondansetron (ZOFRAN) IV   Vital Signs    Vitals:   11/08/16 1956 11/09/16 0000 11/09/16 0500 11/09/16 0728  BP: 139/89 114/79 129/83 (!) 138/99  Pulse: 81 81 94 76  Resp: (!) 23 (!) 21 19 (!) 22  Temp: 98.3 F (36.8 C) 98.4 F (36.9 C) 98 F (36.7 C) 98.2 F (36.8 C)  TempSrc: Oral Oral Oral Oral  SpO2: 97% 96% 93% 94%  Weight:   109 lb 1.6 oz (49.5 kg)   Height:        Intake/Output Summary (Last 24 hours) at 11/09/16 0820 Last data filed at 11/09/16 0651  Gross per 24 hour  Intake              183 ml  Output             1900 ml  Net            -1717 ml   Filed Weights   11/08/16 0112 11/09/16 0500  Weight: 109 lb 6.4 oz (49.6 kg) 109 lb 1.6 oz (49.5 kg)    Telemetry    NSR 11/09/2016  - Personally Reviewed  ECG    SR rate 112 normal - Personally Reviewed  Physical Exam  Black female  GEN: No acute distress.   Neck: No JVD Cardiac: RRR, no murmurs, rubs, or gallops.  Respiratory: Clear to auscultation bilaterally. GI: Soft, nontender, non-distended  MS: No edema; No deformity. Neuro:  Nonfocal  Psych: Normal affect   Labs    Chemistry Recent Labs Lab 11/07/16 2011 11/08/16 0546 11/09/16 0338  NA 136 136 135  K 3.7 3.2* 3.8  CL 105 104 103  CO2 21* 22 23  GLUCOSE 101* 94 114*  BUN 13 9 8   CREATININE  1.14* 0.75 0.61  CALCIUM 9.4 8.7* 9.2  PROT  --  6.8  --   ALBUMIN  --  3.8  --   AST  --  26  --   ALT  --  26  --   ALKPHOS  --  69  --   BILITOT  --  0.7  --   GFRNONAA >60 >60 >60  GFRAA >60 >60 >60  ANIONGAP 10 10 9      Hematology Recent Labs Lab 11/07/16 2011 11/08/16 0546 11/09/16 0338  WBC 7.2 6.4 5.6  RBC 4.63 4.32 4.20  HGB 13.4 12.0 11.7*  HCT 39.3 36.5 35.4*  MCV 84.9 84.5 84.3  MCH 28.9 27.8 27.9  MCHC 34.1 32.9 33.1  RDW 13.7 13.8 13.7  PLT 333 319 292    Cardiac Enzymes Recent Labs Lab 11/08/16 0215 11/08/16 0546 11/08/16 1228  TROPONINI <0.03 <0.03 <0.03    Recent Labs Lab 11/07/16 2025 11/08/16 0021  TROPIPOC 0.01 0.01     BNP Recent Labs Lab 11/08/16 0011  BNP 20.7     DDimer No results for input(s): DDIMER in the last 168 hours.   Radiology    Dg Chest 2 View  Result Date: 11/07/2016 CLINICAL DATA:  Central chest pain.  Cough. EXAM: CHEST  2 VIEW COMPARISON:  10/08/2014 FINDINGS: Mild cardiomegaly. Mediastinal contours are unchanged. No pulmonary edema, focal airspace disease, pleural fluid or pneumothorax. No acute osseous abnormalities. IMPRESSION: Mild cardiomegaly.  No congestive failure or localizing process. Electronically Signed   By: Rubye Oaks M.D.   On: 11/07/2016 21:07   Ct Angio Chest Pe W Or Wo Contrast  Result Date: 11/08/2016 CLINICAL DATA:  Increasing shortness of breath and chest pain for a few days. EXAM: CT ANGIOGRAPHY CHEST WITH CONTRAST TECHNIQUE: Multidetector CT imaging of the chest was performed using the standard protocol during bolus administration of intravenous contrast. Multiplanar CT image reconstructions and MIPs were obtained to evaluate the vascular anatomy. CONTRAST:  75 mL Isovue 370 COMPARISON:  None. FINDINGS: Cardiovascular: Satisfactory opacification of the pulmonary arteries to the segmental level. No evidence of pulmonary embolism. Normal heart size. No pericardial effusion.  Mediastinum/Nodes: No enlarged mediastinal, hilar, or axillary lymph nodes. Thyroid gland, trachea, and esophagus demonstrate no significant findings. Lungs/Pleura: Lungs are clear. No pleural effusion or pneumothorax. Upper Abdomen: No acute abnormality. Musculoskeletal: No chest wall abnormality. No acute or significant osseous findings. Review of the MIP images confirms the above findings. IMPRESSION: No evidence of significant pulmonary embolus. No evidence of active pulmonary disease. Electronically Signed   By: Burman Nieves M.D.   On: 11/08/2016 05:13    Cardiac Studies   Echo EF 35-40%   Patient Profile     41 y.o. 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 D/c norvasc start Cozaar 100 mg And be d/c on normadyne and Hyzaar will arrange outpatient f/u ok to d/c home today  Signed, Charlton Haws, MD  11/09/2016, 8:20 AM

## 2016-11-09 NOTE — Progress Notes (Signed)
Pt received all discharge information and education. Pt also received "What you need to know" informational sheet for the pneumococcal vaccine she received in her left deltoid. Pt verbalizes understanding for all information given for discharge.

## 2016-11-10 ENCOUNTER — Emergency Department (HOSPITAL_COMMUNITY)
Admission: EM | Admit: 2016-11-10 | Discharge: 2016-11-10 | Disposition: A | Discharge status: Home / Self Care | Payer: Medicaid Other | Attending: Emergency Medicine | Admitting: Emergency Medicine

## 2016-11-10 ENCOUNTER — Emergency Department (HOSPITAL_COMMUNITY): Payer: Medicaid Other

## 2016-11-10 DIAGNOSIS — R55 Syncope and collapse: Secondary | ICD-10-CM | POA: Insufficient documentation

## 2016-11-10 DIAGNOSIS — Z7982 Long term (current) use of aspirin: Secondary | ICD-10-CM | POA: Diagnosis not present

## 2016-11-10 DIAGNOSIS — Z79899 Other long term (current) drug therapy: Secondary | ICD-10-CM | POA: Diagnosis not present

## 2016-11-10 DIAGNOSIS — I110 Hypertensive heart disease with heart failure: Secondary | ICD-10-CM | POA: Insufficient documentation

## 2016-11-10 DIAGNOSIS — Z87891 Personal history of nicotine dependence: Secondary | ICD-10-CM | POA: Diagnosis not present

## 2016-11-10 DIAGNOSIS — R0602 Shortness of breath: Secondary | ICD-10-CM | POA: Diagnosis present

## 2016-11-10 DIAGNOSIS — I5042 Chronic combined systolic (congestive) and diastolic (congestive) heart failure: Secondary | ICD-10-CM | POA: Diagnosis not present

## 2016-11-10 LAB — CBC
HCT: 38.5 % (ref 36.0–46.0)
Hemoglobin: 12.9 g/dL (ref 12.0–15.0)
MCH: 28.6 pg (ref 26.0–34.0)
MCHC: 33.5 g/dL (ref 30.0–36.0)
MCV: 85.4 fL (ref 78.0–100.0)
Platelets: 351 10*3/uL (ref 150–400)
RBC: 4.51 MIL/uL (ref 3.87–5.11)
RDW: 13.7 % (ref 11.5–15.5)
WBC: 9.6 10*3/uL (ref 4.0–10.5)

## 2016-11-10 LAB — BASIC METABOLIC PANEL
Anion gap: 14 (ref 5–15)
BUN: 13 mg/dL (ref 6–20)
CO2: 26 mmol/L (ref 22–32)
Calcium: 9.9 mg/dL (ref 8.9–10.3)
Chloride: 100 mmol/L — ABNORMAL LOW (ref 101–111)
Creatinine, Ser: 1.13 mg/dL — ABNORMAL HIGH (ref 0.44–1.00)
GFR calc Af Amer: >60 mL/min (ref >60)
GFR calc non Af Amer: >60 mL/min (ref >60)
Glucose, Bld: 96 mg/dL (ref 65–99)
Potassium: 3.5 mmol/L (ref 3.5–5.1)
Sodium: 140 mmol/L (ref 135–145)

## 2016-11-10 LAB — I-STAT BETA HCG BLOOD, ED (MC, WL, AP ONLY): I-stat hCG, quantitative: <5 m[IU]/mL (ref <5)

## 2016-11-10 LAB — BRAIN NATRIURETIC PEPTIDE: B Natriuretic Peptide: 13.2 pg/mL (ref 0.0–100.0)

## 2016-11-10 LAB — I-STAT TROPONIN, ED: Troponin i, poc: 0 ng/mL (ref 0.00–0.08)

## 2016-11-10 NOTE — ED Provider Notes (Signed)
[facility]-EMERGENCY DEPT Provider Note   CSN: [id number] Arrival date & time: 11/10/16  0109  By signing my name below, I, Cynda Acres, attest that this documentation has been prepared under the direction and in the presence of Azalia Bilis, MD. Electronically Signed: Cynda Acres, Scribe. 11/10/16. 2:52 AM.    History   Chief Complaint Chief Complaint  Patient presents with  . Shortness of Breath    HPI Comments: [name] is a 41 y.o. female with a history of Chronic combined CHF and hypertension, who presents to the Emergency Department complaining of sudden-onset, intermittent shortness of breath that began earlier today. Patient was discharged earlier today, in which she was placed on losartan and labetalol. Patient was noted to be noncompliant with blood pressure medications prior to recent admission. Patient states she went home and began feeling "hot and dizzy". Patient states she loss consciousness on her bed, but could still hear people talking. Patient reports having another syncopal episode minutes after the first episode. Patient reports associated palpitations. No modifying factors indicated. Patient denies any fever, chills, nausea, or vomiting.   The history is provided by the patient. No language interpreter was used.    Past Medical History:  Diagnosis Date  . Anemia   . GERD (gastroesophageal reflux disease)   . Hypertension   . Miscarriage    x3  . Sickle cell trait [facility])     Patient Active Problem List   Diagnosis Date Noted  . Chronic combined systolic and diastolic congestive heart failure (HCC) 11/09/2016  . Chest pain 11/08/2016  . Hypertensive urgency 11/08/2016  . Admission for sterilization 08/17/2012  . Spontaneous vaginal delivery 08/16/2012  . Consultation for sterilization 07/25/2012  . Poor fetal growth, affecting management of mother, antepartum condition or complication 07/11/2012  . Chronic hypertension complicating or reason for care  during pregnancy 04/30/2012  . Supervision of high-risk pregnancy 04/30/2012  . Sickle cell trait (HCC) 04/30/2012    Past Surgical History:  Procedure Laterality Date  . BREAST SURGERY     cyst removal 1990  . DILATION AND CURETTAGE OF UTERUS     four miscarriages  . TUBAL LIGATION Bilateral 08/17/2012   Procedure: POST PARTUM TUBAL LIGATION;  Surgeon: Allie Bossier, MD;  Location: [facility];  Service: Gynecology;  Laterality: Bilateral;    OB History    Gravida Para Term Preterm AB Living   5 2 2  0 3 2   SAB TAB Ectopic Multiple Live Births   3 0 0 0 2       Home Medications    Prior to Admission medications   Medication Sig Start Date End Date Taking? Authorizing Provider  albuterol (PROVENTIL HFA;VENTOLIN HFA) 108 (90 Base) MCG/ACT inhaler Inhale 2 puffs into the lungs every 4 (four) hours as needed for wheezing or shortness of breath (cough, shortness of breath or wheezing.). 11/09/16   Cleora Fleet, MD  aspirin EC 81 MG EC tablet Take 1 tablet (81 mg total) by mouth daily. 11/10/16 12/10/16  Johnson, Clanford L, MD  guaiFENesin (MUCINEX) 600 MG 12 hr tablet Take 1 tablet (600 mg total) by mouth 2 (two) times daily. 11/09/16 11/16/16  Johnson, Clanford L, MD  labetalol (NORMODYNE) 300 MG tablet Take 1 tablet (300 mg total) by mouth 2 (two) times daily. 11/09/16   Johnson, Clanford L, MD  losartan (COZAAR) 100 MG tablet Take 1 tablet (100 mg total) by mouth daily. 11/09/16 12/09/16  Cleora Fleet, MD  Family History Family History  Problem Relation Age of Onset  . Hypertension Mother   . Hypertension Maternal Grandmother   . Hypertension Maternal Grandfather   . Other Neg Hx     Social History Social History  Substance Use Topics  . Smoking status: Former Smoker    Packs/day: 0.25    Years: 10.00    Types: Cigarettes  . Smokeless tobacco: Never Used  . Alcohol use No     Allergies   Patient has no known allergies.   Review of Systems Review of  Systems  A complete 10 system review of systems was obtained and all systems are negative except as noted in the HPI and PMH.    Physical Exam Updated Vital Signs BP 131/84 (BP Location: Right Arm)   Pulse 91   Temp 98.7 F (37.1 C) (Oral)   Resp (!) 30   LMP 11/03/2016   SpO2 94%   Physical Exam  Constitutional: She is oriented to person, place, and time. She appears well-developed and well-nourished. No distress.  HENT:  Head: Normocephalic and atraumatic.  Eyes: EOM are normal.  Neck: Normal range of motion.  Cardiovascular: Normal rate, regular rhythm and normal heart sounds.   Pulmonary/Chest: Effort normal and breath sounds normal.  Abdominal: Soft. She exhibits no distension. There is no tenderness.  Musculoskeletal: Normal range of motion.  Neurological: She is alert and oriented to person, place, and time.  Skin: Skin is warm and dry.  Psychiatric: She has a normal mood and affect. Judgment normal.  Nursing note and vitals reviewed.    ED Treatments / Results  DIAGNOSTIC STUDIES: Oxygen Saturation is 94% on RA, adequate by my interpretation.    COORDINATION OF CARE: 2:50 AM Discussed treatment plan with pt at bedside and pt agreed to plan.  Labs (all labs ordered are listed, but only abnormal results are displayed) Labs Reviewed  BASIC METABOLIC PANEL - Abnormal; Notable for the following:       Result Value   Chloride 100 (*)    Creatinine, Ser 1.13 (*)    All other components within normal limits  CBC  BRAIN NATRIURETIC PEPTIDE  I-STAT TROPOININ, ED  I-STAT BETA HCG BLOOD, ED ([facility], [facility], [facility] ONLY)    EKG  EKG Interpretation  Date/Time:  Friday Nov 10 2016 01:12:46 EDT Ventricular Rate:  89 PR Interval:    QRS Duration: 88 QT Interval:  384 QTC Calculation: 468 R Axis:   18 Text Interpretation:  Sinus rhythm Probable left atrial enlargement Probable left ventricular hypertrophy Nonspecific T abnormalities, diffuse leads No significant change was  found Confirmed by Audriella Blakeley  MD, Caryn Bee ([id number]) on 11/10/2016 2:46:51 AM       Radiology Dg Chest 2 View  Result Date: 11/10/2016 CLINICAL DATA:  Shortness of breath. Passed out this evening at home. Released yesterday from hospital for CHF. EXAM: CHEST  2 VIEW COMPARISON:  11/07/2016 FINDINGS: The heart size and mediastinal contours are within normal limits. Both lungs are clear. The visualized skeletal structures are unremarkable. IMPRESSION: No active cardiopulmonary disease. Electronically Signed   By: Burman Nieves M.D.   On: 11/10/2016 03:14    Procedures Procedures (including critical care time)  Medications Ordered in ED Medications - No data to display   Initial Impression / Assessment and Plan / ED Course  I have reviewed the triage vital signs and the nursing notes.  Pertinent labs & imaging results that were available during my care of the patient were reviewed  by me and considered in my medical decision making (see chart for details).     Patient is overall well-appearing.  EKG is without ischemic changes.  Labs without significant abnormality.  Chest x-ray is clear.  Doubt arrhythmia.  She became flushed and warm while cooking and this may have represented mild orthostasis.  Close primary care and cardiology follow-up.  She understands return to the ER for new or worsening symptoms  Final Clinical Impressions(s) / ED Diagnoses   Final diagnoses:  Syncope and collapse    New Prescriptions New Prescriptions   No medications on file   I personally performed the services described in this documentation, which was scribed in my presence. The recorded information has been reviewed and is accurate.        Azalia Bilisampos, Zackory Pudlo, MD 11/10/16 [phone]

## 2016-11-10 NOTE — ED Notes (Signed)
Patient transported to X-ray 

## 2016-11-10 NOTE — ED Notes (Signed)
Patient left at this time with all belongings. 

## 2016-11-10 NOTE — ED Triage Notes (Signed)
Per EMS pt comes from home with complaint of weakness and shortness of breath.  Discharged at noon yesterday from admission on Tuesday and new dx of COPD.  Pt has had persistent cough that causes her chest to hurt.  Pain with palpation and deep inspiration/cough.  Per EMS temp in pt's house was 81 degrees and distress became lessened in the cooler air during transport.  Placed on 2L [place] for comfort.  Sats 96% on RA.

## 2016-11-14 ENCOUNTER — Encounter: Payer: Self-pay | Admitting: Family Medicine

## 2016-11-14 ENCOUNTER — Ambulatory Visit (INDEPENDENT_AMBULATORY_CARE_PROVIDER_SITE_OTHER): Payer: Self-pay | Admitting: Family Medicine

## 2016-11-14 VITALS — BP 152/67 | HR 79 | Temp 98.0°F | Resp 16 | Ht 64.0 in | Wt 115.0 lb

## 2016-11-14 DIAGNOSIS — R519 Headache, unspecified: Secondary | ICD-10-CM

## 2016-11-14 DIAGNOSIS — I10 Essential (primary) hypertension: Secondary | ICD-10-CM

## 2016-11-14 DIAGNOSIS — R51 Headache: Secondary | ICD-10-CM

## 2016-11-14 DIAGNOSIS — I5042 Chronic combined systolic (congestive) and diastolic (congestive) heart failure: Secondary | ICD-10-CM

## 2016-11-14 DIAGNOSIS — G629 Polyneuropathy, unspecified: Secondary | ICD-10-CM

## 2016-11-14 LAB — COMPLETE METABOLIC PANEL WITH GFR
ALT: 14 U/L (ref 6–29)
AST: 14 U/L (ref 10–30)
Albumin: 3.6 g/dL (ref 3.6–5.1)
Alkaline Phosphatase: 74 U/L (ref 33–115)
BUN: 15 mg/dL (ref 7–25)
CO2: 23 mmol/L (ref 20–31)
Calcium: 9.1 mg/dL (ref 8.6–10.2)
Chloride: 108 mmol/L (ref 98–110)
Creat: 0.6 mg/dL (ref 0.50–1.10)
GFR, Est African American: >89 mL/min (ref >=60)
GFR, Est Non African American: >89 mL/min (ref >=60)
Glucose, Bld: 81 mg/dL (ref 65–99)
Potassium: 4.1 mmol/L (ref 3.5–5.3)
Sodium: 138 mmol/L (ref 135–146)
Total Bilirubin: 0.4 mg/dL (ref 0.2–1.2)
Total Protein: 6.7 g/dL (ref 6.1–8.1)

## 2016-11-14 LAB — CBC WITH DIFFERENTIAL/PLATELET
Basophils Absolute: 82 cells/uL (ref 0–200)
Basophils Relative: 1 %
Eosinophils Absolute: 246 cells/uL (ref 15–500)
Eosinophils Relative: 3 %
HCT: 36 % (ref 35.0–45.0)
Hemoglobin: 11.5 g/dL — ABNORMAL LOW (ref 11.7–15.5)
Lymphocytes Relative: 25 %
Lymphs Abs: 2050 cells/uL (ref 850–3900)
MCH: 28.5 pg (ref 27.0–33.0)
MCHC: 31.9 g/dL — ABNORMAL LOW (ref 32.0–36.0)
MCV: 89.1 fL (ref 80.0–100.0)
MPV: 10 fL (ref 7.5–12.5)
Monocytes Absolute: 574 cells/uL (ref 200–950)
Monocytes Relative: 7 %
Neutro Abs: 5248 cells/uL (ref 1500–7800)
Neutrophils Relative %: 64 %
Platelets: 352 10*3/uL (ref 140–400)
RBC: 4.04 MIL/uL (ref 3.80–5.10)
RDW: 13.9 % (ref 11.0–15.0)
WBC: 8.2 10*3/uL (ref 3.8–10.8)

## 2016-11-14 LAB — POCT GLYCOSYLATED HEMOGLOBIN (HGB A1C): Hemoglobin A1C: 5.1

## 2016-11-14 MED ORDER — CLONIDINE HCL 0.1 MG PO TABS
0.1000 mg | ORAL_TABLET | Freq: Once | ORAL | Status: AC
  Administered 2016-11-14: 0.1 mg via ORAL

## 2016-11-14 MED ORDER — LABETALOL HCL 200 MG PO TABS: 400.0000 mg | ORAL_TABLET | Freq: Two times a day (BID) | ORAL | 1 refills | Status: DC

## 2016-11-14 MED ORDER — FUROSEMIDE 40 MG PO TABS: 40.0000 mg | ORAL_TABLET | Freq: Every day | ORAL | 3 refills | Status: DC

## 2016-11-14 MED ORDER — POTASSIUM CHLORIDE ER 10 MEQ PO TBCR: 10.0000 meq | EXTENDED_RELEASE_TABLET | Freq: Every day | ORAL | 0 refills | Status: DC

## 2016-11-14 NOTE — Progress Notes (Signed)
Patient ID: [name], female    DOB: 11/23/1975, 41 y.o.   MRN: [id number]  PCP: Bing NeighborsHarris, Ike Maragh S, FNP  Chief Complaint  Patient presents with  . Establish Care  . Hospitalization Follow-up    BLOOD PRESSURE    Subjective:  HPI  [name] is a 41 y.o. female presents to establish care and for a hospital follow-up Medical problems include: Current everyday smoker, uncontrolled hypertension, CHF, and  Sickle Cell Trait. Ad[facility]/07/2016 for atypical chest pain and hypertension urgency. She presented with acute shortness of breath, chest pain, and dizziness for over 3 days and  was subsequently determined to have congestive heart failure. After a two day admission, she was discharged   on Labetalol and Losartan for hypertension management. She again presented to the ED 11/10/16 for acute shortness of breath and felt that she loss consciousness. She was discharged from the ED after EKG and chest x-ray were both unremarkable. Today she complains of persistent shortness of breath, coughing, and chest tightness for 3-4 days  Reports lower extremity swelling and soreness of legs.  Prior to most recent illness,  [name] reports that she was diagnosed with hypertension over 3 years ago during pregnancy. She was told to continue antihypertension medications, however due to lose of health insurance she stopped taking medication.  [name] also complains of numbness and tingling in fingers and toes which she reports experiencing for several months. For neuropathic type pain, she has a neurology appointment already scheduled for evaluation of these symptoms.  Social History   Social History  . Marital status: Single    Spouse name: N/A  . Number of children: N/A  . Years of education: N/A   Occupational History  . Not on file.   Social History Main Topics  . Smoking status: Former Smoker    Packs/day: 0.25    Years: 10.00    Types: Cigarettes  . Smokeless  tobacco: Never Used  . Alcohol use No  . Drug use: No  . Sexual activity: Yes    Birth control/ protection: None   Other Topics Concern  . Not on file   Social History Narrative  . No narrative on file    Family History  Problem Relation Age of Onset  . Hypertension Mother   . Hypertension Maternal Grandmother   . Hypertension Maternal Grandfather   . Other Neg Hx    Review of Systems  See HPI  Patient Active Problem List   Diagnosis Date Noted  . Chronic combined systolic and diastolic congestive heart failure (HCC) 11/09/2016  . Chest pain 11/08/2016  . Hypertensive urgency 11/08/2016  . Admission for sterilization 08/17/2012  . Spontaneous vaginal delivery 08/16/2012  . Consultation for sterilization 07/25/2012  . Poor fetal growth, affecting management of mother, antepartum condition or complication 07/11/2012  . Chronic hypertension complicating or reason for care during pregnancy 04/30/2012  . Supervision of high-risk pregnancy 04/30/2012  . Sickle cell trait (HCC) 04/30/2012    No Known Allergies  Prior to Admission medications   Medication Sig Start Date End Date Taking? Authorizing Provider  albuterol (PROVENTIL HFA;VENTOLIN HFA) 108 (90 Base) MCG/ACT inhaler Inhale 2 puffs into the lungs every 4 (four) hours as needed for wheezing or shortness of breath (cough, shortness of breath or wheezing.). 11/09/16  Yes Johnson, Clanford L, MD  aspirin EC 81 MG EC tablet Take 1 tablet (81 mg total) by mouth daily. 11/10/16 12/10/16 Yes Cleora FleetJohnson, Clanford L, MD  guaiFENesin (MUCINEX) 600 MG 12 hr tablet Take 1 tablet (600 mg total) by mouth 2 (two) times daily. 11/09/16 11/16/16 Yes Johnson, Clanford L, MD  labetalol (NORMODYNE) 300 MG tablet Take 1 tablet (300 mg total) by mouth 2 (two) times daily. 11/09/16  Yes Johnson, Clanford L, MD  losartan (COZAAR) 100 MG tablet Take 1 tablet (100 mg total) by mouth daily. 11/09/16 12/09/16 Yes Cleora Fleet, MD    Past Medical,  Surgical Family and Social History reviewed and updated.    Objective:   Today's Vitals   11/14/16 1420 11/14/16 1423 11/14/16 1529  BP: (!) 170/90 (!) 169/98 (!) 152/67  Pulse: 79    Resp: 16    Temp: 98 F (36.7 C)    TempSrc: Oral    SpO2: 94%    Weight: 115 lb (52.2 kg)    Height: 5\' 4"  (1.626 m)      Wt Readings from Last 3 Encounters:  11/14/16 115 lb (52.2 kg)  11/09/16 109 lb 1.6 oz (49.5 kg)  10/08/14 120 lb (54.4 kg)   Physical Exam  Constitutional: She is oriented to person, place, and time. She appears well-developed and well-nourished.  HENT:  Head: Normocephalic and atraumatic.  Eyes: Conjunctivae and EOM are normal. Pupils are equal, round, and reactive to light.  Neck: Normal range of motion. Neck supple.  Cardiovascular: Normal rate, regular rhythm, normal heart sounds and intact distal pulses.   Pulmonary/Chest: Effort normal. She has decreased breath sounds in the right upper field, the right middle field, the right lower field, the left upper field, the left middle field and the left lower field. She has no wheezes. She has no rhonchi. She has no rales.  Musculoskeletal: Normal range of motion.  Neurological: She is alert and oriented to person, place, and time.  Psychiatric: She has a normal mood and affect. Her behavior is normal. Judgment and thought content normal.     Assessment & Plan:  1. Chronic combined systolic and diastolic congestive heart failure (HCC) 2. Essential hypertension 3. Shortness of Breath   Cardiology appointment scheduled for 11/28/2016. Do not miss this appointment. Continue to weigh yourself daily.  Any weight gain greater than 2.5 lbs is considered significant fluid retention and is likely related to worsening heart failure. Return to the office or report to the emergency department for further evaluation. Changing medications as follows: Increasing Labetalol 400 mg twice daily and Losartan 100 mg daily Start Lasix 40 mg  twice daily for shortness of breath. With each dose of Lasix , take 1 tablet of Potassium 10 mEQ.  RTC: 1 week for hypertension recheck. Return in 1 month for chronic disease follow-up.   Godfrey Pick. Tiburcio Pea, MSN, FNP-C [facility]  [address] [place], [place] [address] [phone]

## 2016-11-14 NOTE — Patient Instructions (Addendum)
I am starting you on Furosemide (Lasix) 40 mg twice daily.    With each dose of Lasix, take 1 potassium 10 Meq  tablet daily.  I am increasing your Labetalol 400 mg twice daily for blood pressure control.  Return in 1 week for a blood pressure recheck.  Schedule a 1 month follow-up with me.  We will adjust medication accordingly until blood pressure is at goal   Weight yourself daily. If weight increases by more than 3 lbs within 24 hours, go to the emergency department or you may call the clinic during normal business hours.   Hypertension Hypertension is another name for high blood pressure. High blood pressure forces your heart to work harder to pump blood. This can cause problems over time. There are two numbers in a blood pressure reading. There is a top number (systolic) over a bottom number (diastolic). It is best to have a blood pressure below 120/80. Healthy choices can help lower your blood pressure. You may need medicine to help lower your blood pressure if:  Your blood pressure cannot be lowered with healthy choices.  Your blood pressure is higher than 130/80. Follow these instructions at home: Eating and drinking   If directed, follow the DASH eating plan. This diet includes:  Filling half of your plate at each meal with fruits and vegetables.  Filling one quarter of your plate at each meal with whole grains. Whole grains include whole wheat pasta, brown rice, and whole grain bread.  Eating or drinking low-fat dairy products, such as skim milk or low-fat yogurt.  Filling one quarter of your plate at each meal with low-fat (lean) proteins. Low-fat proteins include fish, skinless chicken, eggs, beans, and tofu.  Avoiding fatty meat, cured and processed meat, or chicken with skin.  Avoiding premade or processed food.  Eat less than 1,500 mg of salt (sodium) a day.  Limit alcohol use to no more than 1 drink a day for nonpregnant women and 2 drinks a day for men. One  drink equals 12 oz of beer, 5 oz of wine, or 1 oz of hard liquor. Lifestyle   Work with your doctor to stay at a healthy weight or to lose weight. Ask your doctor what the best weight is for you.  Get at least 30 minutes of exercise that causes your heart to beat faster (aerobic exercise) most days of the week. This may include walking, swimming, or biking.  Get at least 30 minutes of exercise that strengthens your muscles (resistance exercise) at least 3 days a week. This may include lifting weights or pilates.  Do not use any products that contain nicotine or tobacco. This includes cigarettes and e-cigarettes. If you need help quitting, ask your doctor.  Check your blood pressure at home as told by your doctor.  Keep all follow-up visits as told by your doctor. This is important. Medicines   Take over-the-counter and prescription medicines only as told by your doctor. Follow directions carefully.  Do not skip doses of blood pressure medicine. The medicine does not work as well if you skip doses. Skipping doses also puts you at risk for problems.  Ask your doctor about side effects or reactions to medicines that you should watch for. Contact a doctor if:  You think you are having a reaction to the medicine you are taking.  You have headaches that keep coming back (recurring).  You feel dizzy.  You have swelling in your ankles.  You have trouble with  your vision. Get help right away if:  You get a very bad headache.  You start to feel confused.  You feel weak or numb.  You feel faint.  You get very bad pain in your:  Chest.  Belly (abdomen).  You throw up (vomit) more than once.  You have trouble breathing. Summary  Hypertension is another name for high blood pressure.  Making healthy choices can help lower blood pressure. If your blood pressure cannot be controlled with healthy choices, you may need to take medicine. This information is not intended to  replace advice given to you by your health care provider. Make sure you discuss any questions you have with your health care provider. Document Released: 12/06/2007 Document Revised: 05/17/2016 Document Reviewed: 05/17/2016 Elsevier Interactive Patient Education  2017 Elsevier Inc.    Heart Failure Heart failure means your heart has trouble pumping blood. This makes it hard for your body to work well. Heart failure is usually a long-term (chronic) condition. You must take good care of yourself and follow your doctor's treatment plan. Follow these instructions at home:  Take your heart medicine as told by your doctor.  Do not stop taking medicine unless your doctor tells you to.  Do not skip any dose of medicine.  Refill your medicines before they run out.  Take other medicines only as told by your doctor or pharmacist.  Stay active if told by your doctor. The elderly and people with severe heart failure should talk with a doctor about physical activity.  Eat heart-healthy foods. Choose foods that are without trans fat and are low in saturated fat, cholesterol, and salt (sodium). This includes fresh or frozen fruits and vegetables, fish, lean meats, fat-free or low-fat dairy foods, whole grains, and high-fiber foods. Lentils and dried peas and beans (legumes) are also good choices.  Limit salt if told by your doctor.  Cook in a healthy way. Roast, grill, broil, bake, poach, steam, or stir-fry foods.  Limit fluids as told by your doctor.  Weigh yourself every morning. Do this after you pee (urinate) and before you eat breakfast. Write down your weight to give to your doctor.  Take your blood pressure and write it down if your doctor tells you to.  Ask your doctor how to check your pulse. Check your pulse as told.  Lose weight if told by your doctor.  Stop smoking or chewing tobacco. Do not use gum or patches that help you quit without your doctor's approval.  Schedule and go to  doctor visits as told.  Nonpregnant women should have no more than 1 drink a day. Men should have no more than 2 drinks a day. Talk to your doctor about drinking alcohol.  Stop illegal drug use.  Stay current with shots (immunizations).  Manage your health conditions as told by your doctor.  Learn to manage your stress.  Rest when you are tired.  If it is really hot outside:  Avoid intense activities.  Use air conditioning or fans, or get in a cooler place.  Avoid caffeine and alcohol.  Wear loose-fitting, lightweight, and light-colored clothing.  If it is really cold outside:  Avoid intense activities.  Layer your clothing.  Wear mittens or gloves, a hat, and a scarf when going outside.  Avoid alcohol.  Learn about heart failure and get support as needed.  Get help to maintain or improve your quality of life and your ability to care for yourself as needed. Contact a doctor if:  You gain weight quickly.  You are more short of breath than usual.  You cannot do your normal activities.  You tire easily.  You cough more than normal, especially with activity.  You have any or more puffiness (swelling) in areas such as your hands, feet, ankles, or belly (abdomen).  You cannot sleep because it is hard to breathe.  You feel like your heart is beating fast (palpitations).  You get dizzy or light-headed when you stand up. Get help right away if:  You have trouble breathing.  There is a change in mental status, such as becoming less alert or not being able to focus.  You have chest pain or discomfort.  You faint. This information is not intended to replace advice given to you by your health care provider. Make sure you discuss any questions you have with your health care provider. Document Released: 03/28/2008 Document Revised: 11/25/2015 Document Reviewed: 08/05/2012 Elsevier Interactive Patient Education  2017 [employer].

## 2016-11-15 LAB — POCT URINALYSIS DIP (DEVICE)
Bilirubin Urine: NEGATIVE
Glucose, UA: NEGATIVE mg/dL
Hgb urine dipstick: NEGATIVE
Ketones, ur: NEGATIVE mg/dL
Nitrite: NEGATIVE
Protein, ur: NEGATIVE mg/dL
Specific Gravity, Urine: 1.015 (ref 1.005–1.030)
Urobilinogen, UA: 0.2 mg/dL (ref 0.0–1.0)
pH: 6.5 (ref 5.0–8.0)

## 2016-11-15 LAB — BRAIN NATRIURETIC PEPTIDE: Brain Natriuretic Peptide: 163.1 pg/mL — ABNORMAL HIGH (ref <100)

## 2016-11-16 ENCOUNTER — Telehealth: Payer: Self-pay | Admitting: Family Medicine

## 2016-11-16 NOTE — Telephone Encounter (Signed)
Left a voicemail for patient to call back. Her BNP is elevated and I wanted to know if shortness of breath has improved since she started Lasix on Monday. I would like for her to come in the office in the morning to have a repeat BNP completed. Please notify me when patient calls back

## 2016-11-17 NOTE — Telephone Encounter (Signed)
Spoke with patient and she will come back in on Tuesday

## 2016-11-21 ENCOUNTER — Inpatient Hospital Stay (HOSPITAL_COMMUNITY): Admission: RE | Admit: 2016-11-21 | Payer: Self-pay | Source: Ambulatory Visit

## 2016-11-27 NOTE — Progress Notes (Signed)
Cardiology Office Note    Date:  11/28/2016   ID:  [name], DOB 04-02-1976, MRN [id number]  PCP:  Bing Neighbors, FNP  Cardiologist:  Dr. Eden Emms  CC: post hospital follow up  History of Present Illness:  [name] is a 41 y.o. female with a history of HTN, sickle cell trait, tobacco abuse, GERD and recently diagnosed dilated cardiomyopathy who presents to clinic for post hospital follow up.   She was recently admitted 5/8-5/10/18. She initially presented for evaluation of chest pain. She was found to be markedly hypertensive 2/2 non compliance with medications. 2D ECHO showed EF 35-40%, mild LVH, G2DD, mild RV dysfunction. 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 She was discharged on labetalol 400mg  BID and Losartan 100mg  daily.   She was seen by PCP on 11/14/16 and complained of significant SOB. She was started on lasix 40mg  daily.  Today she presents to clinic for follow up. She has been feeling better since discharge. No more chest pain. SOB improved. Still gets some mild LE edema at night as well as orthopnea and PND. No dizziness or syncope. No blood in stool or urine. Has been taking all her medications. Applied for Medicaid and has meeting next week.     Past Medical History:  Diagnosis Date  . Anemia   . GERD (gastroesophageal reflux disease)   . Hypertension   . Miscarriage    x3  . Sickle cell trait [facility])     Past Surgical History:  Procedure Laterality Date  . BREAST SURGERY     cyst removal 1990  . DILATION AND CURETTAGE OF UTERUS     four miscarriages  . TUBAL LIGATION Bilateral 08/17/2012   Procedure: POST PARTUM TUBAL LIGATION;  Surgeon: Allie Bossier, MD;  Location: [facility];  Service: Gynecology;  Laterality: Bilateral;    Current Medications: Outpatient Medications Prior to Visit  Medication Sig Dispense Refill  . albuterol (PROVENTIL HFA;VENTOLIN HFA) 108 (90 Base) MCG/ACT inhaler Inhale 2 puffs into the lungs every 4 (four)  hours as needed for wheezing or shortness of breath (cough, shortness of breath or wheezing.). 1 Inhaler 0  . aspirin EC 81 MG EC tablet Take 1 tablet (81 mg total) by mouth daily. 30 tablet 0  . furosemide (LASIX) 40 MG tablet Take 1 tablet (40 mg total) by mouth daily. 60 tablet 3  . labetalol (NORMODYNE) 200 MG tablet Take 2 tablets (400 mg total) by mouth 2 (two) times daily. 60 tablet 1  . losartan (COZAAR) 100 MG tablet Take 1 tablet (100 mg total) by mouth daily. 30 tablet 0  . potassium chloride (K-DUR) 10 MEQ tablet Take 1 tablet (10 mEq total) by mouth daily. 90 tablet 0   No facility-administered medications prior to visit.      Allergies:   Patient has no known allergies.   Social History   Social History  . Marital status: Single    Spouse name: N/A  . Number of children: N/A  . Years of education: N/A   Social History Main Topics  . Smoking status: Former Smoker    Packs/day: 0.25    Years: 10.00    Types: Cigarettes  . Smokeless tobacco: Never Used  . Alcohol use No  . Drug use: No  . Sexual activity: Yes    Birth control/ protection: None   Other Topics Concern  . None   Social History Narrative  . None     Family History:  The patient's family history includes Hypertension in her maternal grandfather, maternal grandmother, and mother.      ROS:   Please see the history of present illness.    ROS All other systems reviewed and are negative.   PHYSICAL EXAM:   VS:  BP (!) 170/110   Pulse 82   Ht 5\' 4"  (1.626 m)   Wt 116 lb 1.9 oz (52.7 kg)   LMP 11/03/2016   BMI 19.93 kg/m    GEN: Well nourished, well developed, in no acute distress  HEENT: normal  Neck: no JVD, carotid bruits, or masses Cardiac: RRR; no murmurs, rubs, or gallops,no edema  Respiratory:  clear to auscultation bilaterally, normal work of breathing GI: soft, nontender, nondistended, + BS MS: no deformity or atrophy  Skin: warm and dry, no rash Neuro:  Alert and Oriented x 3,  Strength and sensation are intact Psych: euthymic mood, full affec    Wt Readings from Last 3 Encounters:  11/28/16 116 lb 1.9 oz (52.7 kg)  11/14/16 115 lb (52.2 kg)  11/09/16 109 lb 1.6 oz (49.5 kg)      Studies/Labs Reviewed:   EKG:  EKG is NOT ordered today.   Recent Labs: 11/08/2016: TSH 1.712 11/14/2016: ALT 14; Brain Natriuretic Peptide 163.1; BUN 15; Creat 0.60; Hemoglobin 11.5; Platelets 352; Potassium 4.1; Sodium 138   Lipid Panel No results found for: CHOL, TRIG, HDL, CHOLHDL, VLDL, LDLCALC, LDLDIRECT  Additional studies/ records that were reviewed today include:  2D ECHO: 11/08/2016 LV EF: 35% -   40% Study Conclusions - Left ventricle: The cavity size was normal. Wall thickness was   increased in a pattern of mild LVH. Systolic function was   moderately reduced. The estimated ejection fraction was in the   range of 35% to 40%. Features are consistent with a pseudonormal   left ventricular filling pattern, with concomitant abnormal   relaxation and increased filling pressure (grade 2 diastolic   dysfunction). - Right ventricle: Systolic function was mildly reduced.    ASSESSMENT & PLAN:   Dilated cardiomyopathy: felt to be 2/2 hypertensive cardiomyopathy. She appears euvoleumic but complains of orthopnea and PND. Will check BMET and BNP today. She will continue on lasix 40mg  daily for now. -- Continue Labetalol 400mg  BID and Losartan 100mg  daily. Will add spiro 25mg  daily and have her seen in the HTN clinic in 2 wees for repeat labs and titration of spiro  --  She just filled her labetalol but I would like to switch this to Coreg 25mg  BID before she refills it next month. Also she may benefit from [place] once she gets approved for Medicaid.  HTN: BP still significantly elevated at 170/110. Will add spiro 25mg  daily. See above.   SS trait: followed at [facility].   Tobacco abuse: counseled on the importance of complete cessation  Medication  Adjustments/Labs and Tests Ordered: Current medicines are reviewed at length with the patient today.  Concerns regarding medicines are outlined above.  Medication changes, Labs and Tests ordered today are listed in the Patient Instructions below. Patient Instructions  Medication Instructions:  Your physician has recommended you make the following change in your medication:  1.  START Spironolactone 25 mg taking 1 tablet daily  Labwork: TODAY:  BMET & PRO BNP  Testing/Procedures: None ordered  Follow-Up: Your physician recommends that you schedule a follow-up appointment in: 2 WEEKS WITH THE PHARM D IN THE HYPERTENSION CLINIC  Your physician recommends that you schedule a  follow-up appointment in: 1 MONTH WITH KATIE Keaira Whitehurst, PA-C    Any Other Special Instructions Will Be Listed Below (If Applicable).     If you need a refill on your cardiac medications before your next appointment, please call your pharmacy.      Signed, Cline CrockKathryn Briane Birden, PA-C  11/28/2016 3:27 PM    [facility] [address], [place], [place]  [address] Phone: [phone]; Fax: [phone]

## 2016-11-28 ENCOUNTER — Encounter: Payer: Self-pay | Admitting: Physician Assistant

## 2016-11-28 ENCOUNTER — Ambulatory Visit (INDEPENDENT_AMBULATORY_CARE_PROVIDER_SITE_OTHER): Payer: Self-pay | Admitting: Physician Assistant

## 2016-11-28 VITALS — BP 170/110 | HR 82 | Ht 64.0 in | Wt 116.1 lb

## 2016-11-28 DIAGNOSIS — I5022 Chronic systolic (congestive) heart failure: Secondary | ICD-10-CM

## 2016-11-28 DIAGNOSIS — I420 Dilated cardiomyopathy: Secondary | ICD-10-CM

## 2016-11-28 DIAGNOSIS — D573 Sickle-cell trait: Secondary | ICD-10-CM

## 2016-11-28 DIAGNOSIS — I110 Hypertensive heart disease with heart failure: Secondary | ICD-10-CM

## 2016-11-28 DIAGNOSIS — Z720 Tobacco use: Secondary | ICD-10-CM

## 2016-11-28 MED ORDER — SPIRONOLACTONE 25 MG PO TABS: 25.0000 mg | ORAL_TABLET | Freq: Every day | ORAL | 3 refills | Status: DC

## 2016-11-28 MED FILL — ?SPIRONOLACTONE 25 MG TABLE: 25 | 30 days supply | Qty: 30 | Fill #0

## 2016-11-28 NOTE — Patient Instructions (Addendum)
Medication Instructions:  Your physician has recommended you make the following change in your medication:  1.  START Spironolactone 25 mg taking 1 tablet daily  Labwork: TODAY:  BMET & PRO BNP  Testing/Procedures: None ordered  Follow-Up: Your physician recommends that you schedule a follow-up appointment in: 2 WEEKS WITH THE PHARM D IN THE HYPERTENSION CLINIC  Your physician recommends that you schedule a follow-up appointment in: 1 MONTH WITH KATIE THOMPSON, PA-C    Any Other Special Instructions Will Be Listed Below (If Applicable).     If you need a refill on your cardiac medications before your next appointment, please call your pharmacy.

## 2016-11-29 LAB — BASIC METABOLIC PANEL
BUN/Creatinine Ratio: 15 (ref 9–23)
BUN: 10 mg/dL (ref 6–20)
CO2: 23 mmol/L (ref 18–29)
Calcium: 9.7 mg/dL (ref 8.7–10.2)
Chloride: 105 mmol/L (ref 96–106)
Creatinine, Ser: 0.67 mg/dL (ref 0.57–1.00)
GFR calc Af Amer: 129 mL/min/{1.73_m2} (ref >59)
GFR calc non Af Amer: 112 mL/min/{1.73_m2} (ref >59)
Glucose: 84 mg/dL (ref 65–99)
Potassium: 5.1 mmol/L (ref 3.5–5.2)
Sodium: 141 mmol/L (ref 134–144)

## 2016-11-29 LAB — PRO B NATRIURETIC PEPTIDE: NT-Pro BNP: 1050 pg/mL — ABNORMAL HIGH (ref 0–130)

## 2016-12-06 ENCOUNTER — Encounter: Payer: Self-pay | Admitting: *Deleted

## 2016-12-14 ENCOUNTER — Ambulatory Visit: Payer: Self-pay

## 2016-12-15 ENCOUNTER — Ambulatory Visit: Payer: Self-pay | Admitting: Family Medicine

## 2016-12-15 ENCOUNTER — Ambulatory Visit: Payer: Self-pay

## 2016-12-19 ENCOUNTER — Encounter: Payer: Self-pay | Admitting: Family Medicine

## 2016-12-19 ENCOUNTER — Ambulatory Visit (INDEPENDENT_AMBULATORY_CARE_PROVIDER_SITE_OTHER): Payer: Self-pay | Admitting: Family Medicine

## 2016-12-19 VITALS — BP 164/94 | HR 89 | Temp 98.2°F | Resp 12 | Ht 64.0 in | Wt 116.2 lb

## 2016-12-19 DIAGNOSIS — I10 Essential (primary) hypertension: Secondary | ICD-10-CM

## 2016-12-19 DIAGNOSIS — I5022 Chronic systolic (congestive) heart failure: Secondary | ICD-10-CM

## 2016-12-19 MED ORDER — CARVEDILOL 25 MG PO TABS: 25.0000 mg | ORAL_TABLET | Freq: Two times a day (BID) | ORAL | 1 refills | Status: DC

## 2016-12-19 MED ORDER — NICOTINE 21 MG/24HR TD PT24: 21.0000 mg | MEDICATED_PATCH | Freq: Every day | TRANSDERMAL | 11 refills | Status: DC

## 2016-12-19 MED ORDER — BUPROPION HCL ER (SR) 150 MG PO TB12: 150.0000 mg | ORAL_TABLET | Freq: Two times a day (BID) | ORAL | 0 refills | Status: DC

## 2016-12-19 MED ORDER — POTASSIUM CHLORIDE ER 10 MEQ PO TBCR: 10.0000 meq | EXTENDED_RELEASE_TABLET | Freq: Every day | ORAL | 0 refills | Status: DC

## 2016-12-19 MED ORDER — ALBUTEROL SULFATE HFA 108 (90 BASE) MCG/ACT IN AERS: 2.0000 | INHALATION_SPRAY | RESPIRATORY_TRACT | 0 refills | Status: DC | PRN

## 2016-12-19 MED ORDER — FUROSEMIDE 40 MG PO TABS: 40.0000 mg | ORAL_TABLET | Freq: Every day | ORAL | 3 refills | Status: DC

## 2016-12-19 MED ORDER — LOSARTAN POTASSIUM 100 MG PO TABS: 100.0000 mg | ORAL_TABLET | Freq: Every day | ORAL | 1 refills | Status: DC

## 2016-12-19 MED ORDER — CLONIDINE HCL 0.2 MG PO TABS
0.2000 mg | ORAL_TABLET | Freq: Once | ORAL | Status: AC
  Administered 2016-12-19: 0.2 mg via ORAL

## 2016-12-19 MED FILL — ?FUROSEMIDE 40 MG TABLET: 40 | 30 days supply | Qty: 30 | Fill #0

## 2016-12-19 MED FILL — !VENTOLIN HFA INHALER: 108 (90 BAS | 25 days supply | Qty: 18 | Fill #0

## 2016-12-19 MED FILL — LOSARTAN POTASSIUM 100 MG T: 100 | 30 days supply | Qty: 30 | Fill #0

## 2016-12-19 MED FILL — BUPROPION SR 150 MG TABLET: 150 | 30 days supply | Qty: 60 | Fill #0

## 2016-12-19 MED FILL — POTASSIUM CL 10 MEQ TAB SA: 10 | 30 days supply | Qty: 30 | Fill #0

## 2016-12-19 MED FILL — CARVEDILOL 25 MG TABLET: 25 | 30 days supply | Qty: 60 | Fill #0

## 2016-12-19 NOTE — Patient Instructions (Signed)
The medications you should be taking is as follows:  -Albuterol Inhaler -Furosemide 40 mg -Coreg 25 mg twice a day -Losartan 100 mg daily -Spironolactone 25 mg daily   Please take all medications as prescribed. Return to the office in 2 weeks for blood pressure recheck only. It is any weight gain of 2.5 pounds or greater within 24 hours please follow up with our office immediately. If you develop any lower extremity swelling please follow up with our office immediately.    Hypertension Hypertension is another name for high blood pressure. High blood pressure forces your heart to work harder to pump blood. This can cause problems over time. There are two numbers in a blood pressure reading. There is a top number (systolic) over a bottom number (diastolic). It is best to have a blood pressure below 120/80. Healthy choices can help lower your blood pressure. You may need medicine to help lower your blood pressure if:  Your blood pressure cannot be lowered with healthy choices.  Your blood pressure is higher than 130/80.  Follow these instructions at home: Eating and drinking  If directed, follow the DASH eating plan. This diet includes: ? Filling half of your plate at each meal with fruits and vegetables. ? Filling one quarter of your plate at each meal with whole grains. Whole grains include whole wheat pasta, brown rice, and whole grain bread. ? Eating or drinking low-fat dairy products, such as skim milk or low-fat yogurt. ? Filling one quarter of your plate at each meal with low-fat (lean) proteins. Low-fat proteins include fish, skinless chicken, eggs, beans, and tofu. ? Avoiding fatty meat, cured and processed meat, or chicken with skin. ? Avoiding premade or processed food.  Eat less than 1,500 mg of salt (sodium) a day.  Limit alcohol use to no more than 1 drink a day for nonpregnant women and 2 drinks a day for men. One drink equals 12 oz of beer, 5 oz of wine, or 1 oz of  hard liquor. Lifestyle  Work with your doctor to stay at a healthy weight or to lose weight. Ask your doctor what the best weight is for you.  Get at least 30 minutes of exercise that causes your heart to beat faster (aerobic exercise) most days of the week. This may include walking, swimming, or biking.  Get at least 30 minutes of exercise that strengthens your muscles (resistance exercise) at least 3 days a week. This may include lifting weights or pilates.  Do not use any products that contain nicotine or tobacco. This includes cigarettes and e-cigarettes. If you need help quitting, ask your doctor.  Check your blood pressure at home as told by your doctor.  Keep all follow-up visits as told by your doctor. This is important. Medicines  Take over-the-counter and prescription medicines only as told by your doctor. Follow directions carefully.  Do not skip doses of blood pressure medicine. The medicine does not work as well if you skip doses. Skipping doses also puts you at risk for problems.  Ask your doctor about side effects or reactions to medicines that you should watch for. Contact a doctor if:  You think you are having a reaction to the medicine you are taking.  You have headaches that keep coming back (recurring).  You feel dizzy.  You have swelling in your ankles.  You have trouble with your vision. Get help right away if:  You get a very bad headache.  You start to feel confused.  You feel weak or numb.  You feel faint.  You get very bad pain in your: ? Chest. ? Belly (abdomen).  You throw up (vomit) more than once.  You have trouble breathing. Summary  Hypertension is another name for high blood pressure.  Making healthy choices can help lower blood pressure. If your blood pressure cannot be controlled with healthy choices, you may need to take medicine. This information is not intended to replace advice given to you by your health care provider. Make  sure you discuss any questions you have with your health care provider. Document Released: 12/06/2007 Document Revised: 05/17/2016 Document Reviewed: 05/17/2016 Elsevier Interactive Patient Education  2018 [employer] Inc.  Heart Failure Heart failure means your heart has trouble pumping blood. This makes it hard for your body to work well. Heart failure is usually a long-term (chronic) condition. You must take good care of yourself and follow your doctor's treatment plan. Follow these instructions at home:  Take your heart medicine as told by your doctor. ? Do not stop taking medicine unless your doctor tells you to. ? Do not skip any dose of medicine. ? Refill your medicines before they run out. ? Take other medicines only as told by your doctor or pharmacist.  Stay active if told by your doctor. The elderly and people with severe heart failure should talk with a doctor about physical activity.  Eat heart-healthy foods. Choose foods that are without trans fat and are low in saturated fat, cholesterol, and salt (sodium). This includes fresh or frozen fruits and vegetables, fish, lean meats, fat-free or low-fat dairy foods, whole grains, and high-fiber foods. Lentils and dried peas and beans (legumes) are also good choices.  Limit salt if told by your doctor.  Cook in a healthy way. Roast, grill, broil, bake, poach, steam, or stir-fry foods.  Limit fluids as told by your doctor.  Weigh yourself every morning. Do this after you pee (urinate) and before you eat breakfast. Write down your weight to give to your doctor.  Take your blood pressure and write it down if your doctor tells you to.  Ask your doctor how to check your pulse. Check your pulse as told.  Lose weight if told by your doctor.  Stop smoking or chewing tobacco. Do not use gum or patches that help you quit without your doctor's approval.  Schedule and go to doctor visits as told.  Nonpregnant women should have no more  than 1 drink a day. Men should have no more than 2 drinks a day. Talk to your doctor about drinking alcohol.  Stop illegal drug use.  Stay current with shots (immunizations).  Manage your health conditions as told by your doctor.  Learn to manage your stress.  Rest when you are tired.  If it is really hot outside: ? Avoid intense activities. ? Use air conditioning or fans, or get in a cooler place. ? Avoid caffeine and alcohol. ? Wear loose-fitting, lightweight, and light-colored clothing.  If it is really cold outside: ? Avoid intense activities. ? Layer your clothing. ? Wear mittens or gloves, a hat, and a scarf when going outside. ? Avoid alcohol.  Learn about heart failure and get support as needed.  Get help to maintain or improve your quality of life and your ability to care for yourself as needed. Contact a doctor if:  You gain weight quickly.  You are more short of breath than usual.  You cannot do your normal activities.  You  tire easily.  You cough more than normal, especially with activity.  You have any or more puffiness (swelling) in areas such as your hands, feet, ankles, or belly (abdomen).  You cannot sleep because it is hard to breathe.  You feel like your heart is beating fast (palpitations).  You get dizzy or light-headed when you stand up. Get help right away if:  You have trouble breathing.  There is a change in mental status, such as becoming less alert or not being able to focus.  You have chest pain or discomfort.  You faint. This information is not intended to replace advice given to you by your health care provider. Make sure you discuss any questions you have with your health care provider. Document Released: 03/28/2008 Document Revised: 11/25/2015 Document Reviewed: 08/05/2012 Elsevier Interactive Patient Education  2017 [employer]

## 2016-12-19 NOTE — Progress Notes (Signed)
Patient ID: [name], female    DOB: 07/30/1975, 41 y.o.   MRN: [id number]  PCP: Bing NeighborsHarris, Deserai Cansler S, FNP  Chief Complaint  Patient presents with  . Follow-up    CHF    Subjective:  HPI [name] is a 41 y.o. female presents for evaluation of heart failure and hypertension. [name] was last seen in office on 11/14/2016 for uncontrolled hypertension and heart failure. During that visit she was being seen for follow-up of an elevated BNP and uncontrolled with elevated blood pressure. During that visit she reported compliance with medications and was taking labetalol 200 mg 2 times daily and furosemide 40 mg daily. Since that visit she has followed up with cardiology, and saw Cline CrockKathryn Thompson PA-she was advised to discontinue labetalol and start losartan 100 mg daily, and placed on spironolactone 25 mg daily. At present she is prescribed losartan 100 mg daily, spironolactone 25 mg daily, and furosemide 40 mg daily. She reports continued episodes of intermittent shortness of breath, denies any lower extremity swelling, denies cough, and hasn't had any recent wheezing. She admits that she has not been able to get the losartan 100 mg daily since last  cardiologist visit on 11/28/2016. She is not monitoring her blood pressure at home nor is she weighing herself daily.  Social History   Social History  . Marital status: Single    Spouse name: N/A  . Number of children: N/A  . Years of education: N/A   Occupational History  . Not on file.   Social History Main Topics  . Smoking status: Former Smoker    Packs/day: 0.25    Years: 10.00    Types: Cigarettes  . Smokeless tobacco: Never Used  . Alcohol use No  . Drug use: No  . Sexual activity: Yes    Birth control/ protection: None   Other Topics Concern  . Not on file   Social History Narrative  . No narrative on file    Family History  Problem Relation Age of Onset  . Hypertension Mother   . Hypertension Maternal  Grandmother   . Hypertension Maternal Grandfather   . Other Neg Hx    Review of Systems See history of present illness  Patient Active Problem List   Diagnosis Date Noted  . Chronic combined systolic and diastolic congestive heart failure (HCC) 11/09/2016  . Chest pain 11/08/2016  . Hypertensive urgency 11/08/2016  . Admission for sterilization 08/17/2012  . Spontaneous vaginal delivery 08/16/2012  . Consultation for sterilization 07/25/2012  . Poor fetal growth, affecting management of mother, antepartum condition or complication 07/11/2012  . Chronic hypertension complicating or reason for care during pregnancy 04/30/2012  . Supervision of high-risk pregnancy 04/30/2012  . Sickle cell trait (HCC) 04/30/2012    No Known Allergies  Prior to Admission medications   Medication Sig Start Date End Date Taking? Authorizing Provider  albuterol (PROVENTIL HFA;VENTOLIN HFA) 108 (90 Base) MCG/ACT inhaler Inhale 2 puffs into the lungs every 4 (four) hours as needed for wheezing or shortness of breath (cough, shortness of breath or wheezing.). 11/09/16  Yes Johnson, Clanford L, MD  furosemide (LASIX) 40 MG tablet Take 1 tablet (40 mg total) by mouth daily. 12/19/16  Yes Bing NeighborsHarris, Dilyn Osoria S, FNP  potassium chloride (K-DUR) 10 MEQ tablet Take 1 tablet (10 mEq total) by mouth daily. 12/19/16  Yes Bing NeighborsHarris, Ala Capri S, FNP  spironolactone (ALDACTONE) 25 MG tablet Take 1 tablet (25 mg total) by mouth daily. 11/28/16 02/26/17 Yes  Janetta Hora, PA-C  carvedilol (COREG) 25 MG tablet Take 1 tablet (25 mg total) by mouth 2 (two) times daily with a meal. 12/19/16   Bing Neighbors, FNP  losartan (COZAAR) 100 MG tablet Take 1 tablet (100 mg total) by mouth daily. 12/19/16 01/18/17  Bing Neighbors, FNP    Past Medical, Surgical Family and Social History reviewed and updated.    Objective:   Today's Vitals   12/19/16 1320 12/19/16 1323 12/19/16 1325  BP: (!) 172/107 (!) 172/102 (!) 176/100  Pulse:  89    Resp: 12    Temp: 98.2 F (36.8 C)    TempSrc: Oral    SpO2: 100%    Weight: 116 lb 3.2 oz (52.7 kg)    Height: 5\' 4"  (1.626 m)      Wt Readings from Last 3 Encounters:  12/19/16 116 lb 3.2 oz (52.7 kg)  11/28/16 116 lb 1.9 oz (52.7 kg)  11/14/16 115 lb (52.2 kg)   Physical Exam  Constitutional: She is oriented to person, place, and time. She appears well-developed and well-nourished.  HENT:  Head: Normocephalic and atraumatic.  Eyes: Conjunctivae and EOM are normal. Pupils are equal, round, and reactive to light.  Neck: Normal range of motion. Neck supple.  Cardiovascular: Normal rate, regular rhythm, normal heart sounds and intact distal pulses.   Pulmonary/Chest: Effort normal and breath sounds normal.  Musculoskeletal: Normal range of motion.  Neurological: She is alert and oriented to person, place, and time.  Skin: Skin is warm and dry.  Psychiatric: She has a normal mood and affect. Her behavior is normal. Judgment and thought content normal.    Assessment & Plan:  1. Essential hypertension - Continue ClloNIDine (CATAPRES) tablet 0.2 mg; Take 1 tablet (0.2 mg total) by mouth once. -Continue carvedilol 25 mg 2 times daily. -Continue hydralazine 25 mg 3 times daily. -Adding losartan 100 mg once daily. -Discontinue labetalol 2. Chronic systolic heart failure (HCC) -Continue albuterol inhaler 2 puffs every 4-6 hours as needed. -Continue furosemide 40 mg once daily. -Continue spironolactone 25 mg once daily.  -If patient can obtain some type of medication assistance program interest is recommended by cardiology in hopes of improving her heart failure related symptoms.  Please take all medications as prescribed. Return to the office in 2 weeks for blood pressure recheck only. It is any weight gain of 2.5 pounds or greater within 24 hours please follow up with our office immediately. If you develop any lower extremity swelling please follow up with our office  immediately.  RTC: Turn for care in 2 weeks for blood pressure recheck   Godfrey Pick. Tiburcio Pea, MSN, FNP-C [facility]  [address] [place], [place] [address] [phone]

## 2016-12-19 NOTE — Progress Notes (Signed)
Cardiology Office Note    Date:  12/20/2016   ID:  Brittany DownerCrystal L Dicamillo, DOB 03/16/1976, MRN [id number]  PCP:  Bing NeighborsHarris, Kimberly S, FNP  Cardiologist:  Dr. Eden EmmsNishan   CC: follow up  History of Present Illness:  [name] is a 41 y.o. female with a history of HTN, sickle cell trait, tobacco abuse, GERD and recently diagnosed dilated cardiomyopathy who presents to clinic for follow up.   She was recently admitted 5/8-5/10/18. She initially presented for evaluation of chest pain. She was found to be markedly hypertensive 2/2 non compliance with medications. 2D ECHO showed EF 35-40%, mild LVH, G2DD, mild RV dysfunction. 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 She was discharged on labetalol 400mg  BID and Losartan 100mg  daily.   She was seen by her PCP on 11/14/16 and complained of significant SOB. She was started on lasix 40mg  daily.   I saw her for follow up on 11/28/16. She complained of some LE edema as well as orthopnea and PND. Her BP remained elevated ~ 170/110. I added spiro 25mg  daily. I also switched her Labetelol to Coreg 25mg  BID. Follow up BNP was ~1050. I asked her to stop Kdur since her K was on the high end of normal with plans for repeat BMET in 1 week, which was never completed.   Today she presents to clinic for follow up. She has been feeling better since being started on Spiro. Her lower extremity edema has improved. She also thinks her breathing is better. She had one episode of chest pain about 2 weeks ago that felt like a "sharp little pain." She was laying in bed when it happened. She has been walking for exercise with no CP or SOB, just a little fatigue. She occasionally has some skipping in her heart. Trying to quit smoking. She is down from 1.5 PPD to 10 cigs a day. She has really been trying to cut back on salt. Applied for Medicaid, now had to wait.     Past Medical History:  Diagnosis Date  . Anemia   . GERD (gastroesophageal reflux disease)   .  Hypertension   . Miscarriage    x3  . Sickle cell trait [facility](HCC)     Past Surgical History:  Procedure Laterality Date  . BREAST SURGERY     cyst removal 1990  . DILATION AND CURETTAGE OF UTERUS     four miscarriages  . TUBAL LIGATION Bilateral 08/17/2012   Procedure: POST PARTUM TUBAL LIGATION;  Surgeon: Allie BossierMyra C Dove, MD;  Location: [facility];  Service: Gynecology;  Laterality: Bilateral;    Current Medications: Outpatient Medications Prior to Visit  Medication Sig Dispense Refill  . albuterol (PROVENTIL HFA;VENTOLIN HFA) 108 (90 Base) MCG/ACT inhaler Inhale 2 puffs into the lungs every 4 (four) hours as needed for wheezing or shortness of breath (cough, shortness of breath or wheezing.). 1 Inhaler 0  . buPROPion (WELLBUTRIN SR) 150 MG 12 hr tablet Take 1 tablet (150 mg total) by mouth 2 (two) times daily. 180 tablet 0  . carvedilol (COREG) 25 MG tablet Take 1 tablet (25 mg total) by mouth 2 (two) times daily with a meal. 180 tablet 1  . furosemide (LASIX) 40 MG tablet Take 1 tablet (40 mg total) by mouth daily. 60 tablet 3  . losartan (COZAAR) 100 MG tablet Take 1 tablet (100 mg total) by mouth daily. 90 tablet 1  . nicotine (NICODERM CQ - DOSED IN MG/24 HOURS) 21 mg/24hr patch Place  1 patch (21 mg total) onto the skin daily. Make substitute generic 28 patch 11  . spironolactone (ALDACTONE) 25 MG tablet Take 1 tablet (25 mg total) by mouth daily. 90 tablet 3   No facility-administered medications prior to visit.      Allergies:   Patient has no known allergies.   Social History   Social History  . Marital status: Single    Spouse name: N/A  . Number of children: N/A  . Years of education: N/A   Social History Main Topics  . Smoking status: Current Every Day Smoker    Packs/day: 0.25    Years: 10.00    Types: Cigarettes  . Smokeless tobacco: Never Used  . Alcohol use No  . Drug use: No  . Sexual activity: Yes    Birth control/ protection: None   Other Topics Concern  .  None   Social History Narrative  . None     Family History:  The patient's family history includes Hypertension in her maternal grandfather, maternal grandmother, and mother.      ROS:   Please see the history of present illness.    ROS All other systems reviewed and are negative.   PHYSICAL EXAM:   VS:  BP (!) 168/110   Pulse (!) 108   Ht 5\' 4"  (1.626 m)   Wt 116 lb (52.6 kg)   LMP 12/16/2016   BMI 19.91 kg/m    GEN: Well nourished, well developed, in no acute distress  HEENT: normal  Neck: no JVD, carotid bruits, or masses Cardiac: RRR; no murmurs, rubs, or gallops,no edema  Respiratory:  clear to auscultation bilaterally, normal work of breathing GI: soft, nontender, nondistended, + BS MS: no deformity or atrophy  Skin: warm and dry, no rash Neuro:  Alert and Oriented x 3, Strength and sensation are intact Psych: euthymic mood, full affect    Wt Readings from Last 3 Encounters:  12/20/16 116 lb (52.6 kg)  12/19/16 116 lb 3.2 oz (52.7 kg)  11/28/16 116 lb 1.9 oz (52.7 kg)      Studies/Labs Reviewed:   EKG:  EKG is NOT ordered today.  Recent Labs: 11/08/2016: TSH 1.712 11/14/2016: ALT 14; Brain Natriuretic Peptide 163.1; Hemoglobin 11.5; Platelets 352 11/28/2016: BUN 10; Creatinine, Ser 0.67; NT-Pro BNP 1,050; Potassium 5.1; Sodium 141   Lipid Panel No results found for: CHOL, TRIG, HDL, CHOLHDL, VLDL, LDLCALC, LDLDIRECT  Additional studies/ records that were reviewed today include:  2D ECHO: 11/08/2016 LV EF: 35% - 40% Study Conclusions - Left ventricle: The cavity size was normal. Wall thickness was increased in a pattern of mild LVH. Systolic function was moderately reduced. The estimated ejection fraction was in the range of 35% to 40%. Features are consistent with a pseudonormal left ventricular filling pattern, with concomitant abnormal relaxation and increased filling pressure (grade 2 diastolic dysfunction). - Right ventricle:  Systolic function was mildly reduced.   ASSESSMENT & PLAN:   Dilated CM: EF 35-45%. Felt to be secondary to hypertensive cardiomyopathy. She is currently on Coreg 25mg  BID, Losartan 100mg  daily, spiro 25mg  daily and lasix 40mg  daily. Her BP is still quite elevated and ~ 180/110 on my personal recheck. I really would like to put her on Entresto +/- BiDil, which will both be covered once she gets her Medicaid approved. For now I will add hydralazine 25mg  TID and have her seen back in the HTN clinic today. Will check a BMET since I started [place] last visit.  HTN: see above.  SS trait: [facility] by the [facility]  Tobacco abuse: she has been cutting back and her PCP has Rx'd nicotine patches.    Medication Adjustments/Labs and Tests Ordered: Current medicines are reviewed at length with the patient today.  Concerns regarding medicines are outlined above.  Medication changes, Labs and Tests ordered today are listed in the Patient Instructions below. Patient Instructions  Medication Instructions:  Your physician has recommended you make the following change in your medication:  1.  START Hydralazine 25 mg taking 1 tablet three times a day   Labwork: TODAY:  BMET  Testing/Procedures: None ordered  Follow-Up: Your physician recommends that you schedule a follow-up appointment in: KEEP APPT AS PLANNED  Any Other Special Instructions Will Be Listed Below (If Applicable).     If you need a refill on your cardiac medications before your next appointment, please call your pharmacy.      Signed, Cline Crock, PA-C  12/20/2016 4:17 PM    [facility] [address] [place], [place], [place]  [address] Phone: [phone]; Fax: [phone]

## 2016-12-20 ENCOUNTER — Ambulatory Visit (INDEPENDENT_AMBULATORY_CARE_PROVIDER_SITE_OTHER): Payer: Self-pay | Admitting: Physician Assistant

## 2016-12-20 ENCOUNTER — Encounter: Payer: Self-pay | Admitting: Physician Assistant

## 2016-12-20 VITALS — BP 168/110 | HR 108 | Ht 64.0 in | Wt 116.0 lb

## 2016-12-20 DIAGNOSIS — Z720 Tobacco use: Secondary | ICD-10-CM

## 2016-12-20 DIAGNOSIS — I420 Dilated cardiomyopathy: Secondary | ICD-10-CM

## 2016-12-20 DIAGNOSIS — I110 Hypertensive heart disease with heart failure: Secondary | ICD-10-CM

## 2016-12-20 DIAGNOSIS — D573 Sickle-cell trait: Secondary | ICD-10-CM

## 2016-12-20 MED ORDER — HYDRALAZINE HCL 25 MG PO TABS: 25.0000 mg | ORAL_TABLET | Freq: Three times a day (TID) | ORAL | 1 refills | Status: DC

## 2016-12-20 NOTE — Patient Instructions (Addendum)
Medication Instructions:  Your physician has recommended you make the following change in your medication:  1.  START Hydralazine 25 mg taking 1 tablet three times a day   Labwork: TODAY:  BMET  Testing/Procedures: None ordered  Follow-Up: Your physician recommends that you schedule a follow-up appointment in: KEEP APPT AS PLANNED  Any Other Special Instructions Will Be Listed Below (If Applicable).     If you need a refill on your cardiac medications before your next appointment, please call your pharmacy.

## 2016-12-21 LAB — BASIC METABOLIC PANEL
BUN/Creatinine Ratio: 18 (ref 9–23)
BUN: 13 mg/dL (ref 6–20)
CO2: 22 mmol/L (ref 20–29)
Calcium: 9.9 mg/dL (ref 8.7–10.2)
Chloride: 105 mmol/L (ref 96–106)
Creatinine, Ser: 0.72 mg/dL (ref 0.57–1.00)
GFR calc Af Amer: 123 mL/min/{1.73_m2} (ref >59)
GFR calc non Af Amer: 107 mL/min/{1.73_m2} (ref >59)
Glucose: 79 mg/dL (ref 65–99)
Potassium: 4.3 mmol/L (ref 3.5–5.2)
Sodium: 142 mmol/L (ref 134–144)

## 2016-12-21 MED FILL — hydrALAZINE HCL 25 MG TABS: 25 | 30 days supply | Qty: 90 | Fill #0

## 2016-12-25 ENCOUNTER — Ambulatory Visit: Payer: Self-pay | Admitting: Neurology

## 2016-12-27 ENCOUNTER — Ambulatory Visit: Payer: Self-pay | Admitting: Pharmacist

## 2016-12-27 DIAGNOSIS — I10 Essential (primary) hypertension: Secondary | ICD-10-CM | POA: Insufficient documentation

## 2016-12-27 NOTE — Progress Notes (Deleted)
Patient ID: [name]                 DOB: [birth year]                      MRN: [id number]     HPI: [name] is a 41 y.o. female patient of Dr. Eden Emms, referred by Cline Crock, PA-C, to HTN clinic. PMH is significant for HTN, sickle cell trait, tobacco abuse, GERD, and recently diagnosed dilated cardiomyopathy with EF 35-40%. At her last office visit on 12/20/16, hydralazine 25mg  tid was started with the hopes to start [facility] once Medicaid application was approved. BMET was checked since the start of spironolactone and was within normal limits.   Patient presents in *** spirits with*** for HTN clinic establishment. She *** dizziness, headaches, or blurred vision. She *** CP and SOB; she is able to *** without SOB. She *** weight gain and edema.   Current HTN meds:  Carvedilol 25mg  bid Furosemide 40mg  daily Hydralazine 25mg  tid Losartan 100mg  daily Spironolactone 25mg  daily  Previously tried: labetalol 600mg  bid   BP goal: < 130/80 mmHg  Family History: Hypertension in her maternal grandfather, maternal grandmother, and mother  Social History: Current smoker (*** cigarettes/day), denies alcohol and illicit drug use  Diet: Trying to cut back on salt ***  Exercise: Walking ***  Home BP readings:   Wt Readings from Last 3 Encounters:  12/20/16 116 lb (52.6 kg)  12/19/16 116 lb 3.2 oz (52.7 kg)  11/28/16 116 lb 1.9 oz (52.7 kg)   BP Readings from Last 3 Encounters:  12/20/16 (!) 168/110  12/19/16 (!) 164/94  11/28/16 (!) 170/110   Pulse Readings from Last 3 Encounters:  12/20/16 (!) 108  12/19/16 89  11/28/16 82    Renal function: Estimated Creatinine Clearance: 79.2 mL/min (by C-G formula based on SCr of 0.72 mg/dL).  Past Medical History:  Diagnosis Date  . Anemia   . GERD (gastroesophageal reflux disease)   . Hypertension   . Miscarriage    x3  . Sickle cell trait [facility])     Current Outpatient Prescriptions on File Prior to Visit  Medication  Sig Dispense Refill  . albuterol (PROVENTIL HFA;VENTOLIN HFA) 108 (90 Base) MCG/ACT inhaler Inhale 2 puffs into the lungs every 4 (four) hours as needed for wheezing or shortness of breath (cough, shortness of breath or wheezing.). 1 Inhaler 0  . buPROPion (WELLBUTRIN SR) 150 MG 12 hr tablet Take 1 tablet (150 mg total) by mouth 2 (two) times daily. 180 tablet 0  . carvedilol (COREG) 25 MG tablet Take 1 tablet (25 mg total) by mouth 2 (two) times daily with a meal. 180 tablet 1  . furosemide (LASIX) 40 MG tablet Take 1 tablet (40 mg total) by mouth daily. 60 tablet 3  . hydrALAZINE (APRESOLINE) 25 MG tablet Take 1 tablet (25 mg total) by mouth 3 (three) times daily. 90 tablet 1  . losartan (COZAAR) 100 MG tablet Take 1 tablet (100 mg total) by mouth daily. 90 tablet 1  . nicotine (NICODERM CQ - DOSED IN MG/24 HOURS) 21 mg/24hr patch Place 1 patch (21 mg total) onto the skin daily. Make substitute generic 28 patch 11  . potassium chloride (K-DUR) 10 MEQ tablet Take 10 mEq by mouth daily.  0  . spironolactone (ALDACTONE) 25 MG tablet Take 1 tablet (25 mg total) by mouth daily. 90 tablet 3   No current facility-administered medications on file prior  to visit.     No Known Allergies   Assessment/Plan:  1. Hypertension - BP *** goal of < *** mmHg. Will ***. Follow-up ***.  Allie BossierApryl Anderson, PharmD PGY1 Resident 12/27/2016 10:25 AM  Patient seen with: Margaretmary DysMegan Supple, PharmD, CPP, [facility] [address], [place], [place] [address] Phone: [phone]; Fax: [phone]

## 2017-01-25 ENCOUNTER — Ambulatory Visit (INDEPENDENT_AMBULATORY_CARE_PROVIDER_SITE_OTHER): Payer: Medicaid Other | Admitting: Neurology

## 2017-01-25 ENCOUNTER — Encounter: Payer: Self-pay | Admitting: Neurology

## 2017-01-25 VITALS — BP 165/90 | HR 77 | Ht 64.0 in | Wt 116.5 lb

## 2017-01-25 DIAGNOSIS — G43019 Migraine without aura, intractable, without status migrainosus: Secondary | ICD-10-CM

## 2017-01-25 DIAGNOSIS — G621 Alcoholic polyneuropathy: Secondary | ICD-10-CM | POA: Diagnosis not present

## 2017-01-25 HISTORY — DX: Migraine without aura, intractable, without status migrainosus: G43.019

## 2017-01-25 MED ORDER — GABAPENTIN 300 MG PO CAPS: 300.0000 mg | ORAL_CAPSULE | Freq: Two times a day (BID) | ORAL | 3 refills | Status: DC

## 2017-01-25 NOTE — Patient Instructions (Signed)
   We will get MRI of the brain and start gabapentin for the headache and for the foot pain.  Neurontin (gabapentin) may result in drowsiness, ankle swelling, gait instability, or possibly dizziness. Please contact our office if significant side effects occur with this medication.

## 2017-01-25 NOTE — Progress Notes (Signed)
Reason for visit: Headache, foot numbness  Referring physician: Dr. Margaretmary EddyHarris  [name] is a 41 y.o. female  History of present illness:  [name] is a [age] left-handed black female with a history of alcohol abuse. The patient indicates that she was in the hospital around May 2018 with congestive heart failure, chest pain and swelling in the legs. The patient also reported dizziness around that time. The patient states that she has been having headaches for about a year, but headaches became more severe over the last 3-4 months. The patient indicates that she gets 2 or 3 headaches a day, lasting 2 or 3 hours at a time. The patient may take Tylenol for the headache. The headaches are usually in the front of the head associated with a pressure and throbbing pain, she may have photophobia and phonophobia with the headache, she denies nausea or vomiting. She denies any significant neck pain or neck stiffness, she occasionally have some neck discomfort. She has had a one-year history of some numbness in the feet and some burning in the toes at nighttime. She has some slight imbalance issues, she denies any falls. She denies problems controlling the bowels or the bladder. She indicates that her mother had headaches, but she also had a brain tumor. The patient is sent to this office for further evaluation.  Past Medical History:  Diagnosis Date  . Anemia   . GERD (gastroesophageal reflux disease)   . Hypertension   . Miscarriage    x3  . Sickle cell trait [facility](HCC)     Past Surgical History:  Procedure Laterality Date  . BREAST SURGERY     cyst removal 1990  . DILATION AND CURETTAGE OF UTERUS     four miscarriages  . TUBAL LIGATION Bilateral 08/17/2012   Procedure: POST PARTUM TUBAL LIGATION;  Surgeon: Allie BossierMyra C Dove, MD;  Location: [facility];  Service: Gynecology;  Laterality: Bilateral;    Family History  Problem Relation Age of Onset  . Hypertension Mother   . Hypertension Maternal  Grandmother   . Hypertension Maternal Grandfather   . Other Neg Hx     Social history:  reports that she has been smoking Cigarettes.  She has a 3.00 pack-year smoking history. She has never used smokeless tobacco. She reports that she does not drink alcohol or use drugs.  Medications:  Prior to Admission medications   Medication Sig Start Date End Date Taking? Authorizing Provider  albuterol (PROVENTIL HFA;VENTOLIN HFA) 108 (90 Base) MCG/ACT inhaler Inhale 2 puffs into the lungs every 4 (four) hours as needed for wheezing or shortness of breath (cough, shortness of breath or wheezing.). 12/19/16  Yes Bing NeighborsHarris, Kimberly S, FNP  buPROPion [facility](WELLBUTRIN SR) 150 MG 12 hr tablet Take 1 tablet (150 mg total) by mouth 2 (two) times daily. 12/19/16  Yes Bing NeighborsHarris, Kimberly S, FNP  carvedilol (COREG) 25 MG tablet Take 1 tablet (25 mg total) by mouth 2 (two) times daily with a meal. 12/19/16  Yes Bing NeighborsHarris, Kimberly S, FNP  furosemide (LASIX) 40 MG tablet Take 1 tablet (40 mg total) by mouth daily. 12/19/16  Yes Bing NeighborsHarris, Kimberly S, FNP  hydrALAZINE (APRESOLINE) 25 MG tablet Take 1 tablet (25 mg total) by mouth 3 (three) times daily. 12/20/16 03/20/17 Yes Janetta Horahompson, Kathryn R, PA-C  nicotine (NICODERM CQ - DOSED IN MG/24 HOURS) 21 mg/24hr patch Place 1 patch (21 mg total) onto the skin daily. Make substitute generic 12/19/16  Yes Bing NeighborsHarris, Kimberly S, FNP  potassium chloride (K-DUR) 10 MEQ tablet Take 10 mEq by mouth daily. 11/24/16  Yes [provider]  spironolactone (ALDACTONE) 25 MG tablet Take 1 tablet (25 mg total) by mouth daily. 11/28/16 02/26/17 Yes Janetta Horahompson, Kathryn R, PA-C  losartan (COZAAR) 100 MG tablet Take 1 tablet (100 mg total) by mouth daily. 12/19/16 01/18/17  Bing NeighborsHarris, Kimberly S, FNP     No Known Allergies  ROS:  Out of a complete 14 system review of symptoms, the patient complains only of the following symptoms, and all other reviewed systems are negative.  Chest pain, swelling in the  legs Shortness of breath, wheezing Weakness, dizziness  Blood pressure (!) 165/90, pulse 77, height 5\' 4"  (1.626 m), weight 116 lb 8 oz (52.8 kg), SpO2 98 %.  Physical Exam  General: The patient is alert and cooperative at the time of the examination.  Eyes: Pupils are equal, round, and reactive to light. Discs are flat bilaterally. Good venous pulsations are seen bilaterally.  Neck: The neck is supple, no carotid bruits are noted.  Respiratory: The respiratory examination is clear.  Cardiovascular: The cardiovascular examination reveals a regular rate and rhythm, no obvious murmurs or rubs are noted.  Skin: Extremities are without significant edema.  Neurologic Exam  Mental status: The patient is alert and oriented x 3 at the time of the examination. The patient has apparent normal recent and remote memory, with an apparently normal attention span and concentration ability.  Cranial nerves: Facial symmetry is present. There is good sensation of the face to pinprick and soft touch bilaterally. The strength of the facial muscles and the muscles to head turning and shoulder shrug are normal bilaterally. Speech is well enunciated, no aphasia or dysarthria is noted. Extraocular movements are full. Visual fields are full. The tongue is midline, and the patient has symmetric elevation of the soft palate. No obvious hearing deficits are noted.  Motor: The motor testing reveals 5 over 5 strength of all 4 extremities. Good symmetric motor tone is noted throughout.  Sensory: Sensory testing is intact to pinprick, soft touch, vibration sensation, and position sense on all 4 extremities. No stocking pattern pinprick sensory deficit was noted in the lower extremities. No evidence of extinction is noted.  Coordination: Cerebellar testing reveals good finger-nose-finger and heel-to-shin bilaterally.  Gait and station: Gait is normal. Tandem gait is normal. Romberg is negative. No drift is  seen.  Reflexes: Deep tendon reflexes are symmetric and normal bilaterally. The ankle jerk reflexes are well-maintained bilaterally. Toes are downgoing bilaterally.   Assessment/Plan:  1. Daily headache  2. 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 Alcohol abuse  The patient had been drinking over a pint of liquor daily up until May of 2018. The heavy alcohol abuse may have in part been a contributing factor in her congestive heart failure. This likely has resulted in a small fiber neuropathy affecting the feet. Cessation of alcohol use likely will result in improvement in her symptoms over time. We will check further blood work to look for other etiologies of neuropathy. The patient is having daily headaches, we will start gabapentin for the neuropathy pain and for the headache working up to 300 mg twice daily. She will undergo MRI of the brain. She will follow-up in 3 months.  Marlan Palau. Keith Willis MD 01/25/2017 11:44 AM  [facility] [address] [place], [place] [id number]  Phone [phone] Fax [phone]

## 2017-01-30 ENCOUNTER — Ambulatory Visit: Payer: Self-pay | Admitting: Family Medicine

## 2017-01-30 MED FILL — ?POTASSIUM CL ER 10 MEQ TAB: 10 MEQ | 30 days supply | Qty: 30 | Fill #1

## 2017-01-30 MED FILL — hydrALAZINE HCL 25 MG TABS: 25 | 30 days supply | Qty: 90 | Fill #1

## 2017-01-30 MED FILL — BUPROPION SR 150 MG TABLET: 150 | 30 days supply | Qty: 60 | Fill #1

## 2017-01-30 MED FILL — LOSARTAN POTASSIUM 100 MG T: 100 | 30 days supply | Qty: 30 | Fill #1

## 2017-01-30 MED FILL — GABAPENTIN 300 MG CAPSULE: 300 | 30 days supply | Qty: 60 | Fill #0

## 2017-01-31 LAB — MULTIPLE MYELOMA PANEL, SERUM
Albumin SerPl Elph-Mcnc: 3.5 g/dL (ref 2.9–4.4)
Albumin/Glob SerPl: 1.1 (ref 0.7–1.7)
Alpha 1: 0.2 g/dL (ref 0.0–0.4)
Alpha2 Glob SerPl Elph-Mcnc: 0.7 g/dL (ref 0.4–1.0)
B-Globulin SerPl Elph-Mcnc: 1 g/dL (ref 0.7–1.3)
Gamma Glob SerPl Elph-Mcnc: 1.2 g/dL (ref 0.4–1.8)
Globulin, Total: 3.2 g/dL (ref 2.2–3.9)
IgA/Immunoglobulin A, Serum: 152 mg/dL (ref 87–352)
IgG (Immunoglobin G), Serum: 1142 mg/dL (ref 700–1600)
IgM (Immunoglobulin M), Srm: 110 mg/dL (ref 26–217)
Total Protein: 6.7 g/dL (ref 6.0–8.5)

## 2017-01-31 LAB — RPR: RPR Ser Ql: NONREACTIVE

## 2017-01-31 LAB — SEDIMENTATION RATE: Sed Rate: 20 mm/hr (ref 0–32)

## 2017-01-31 LAB — B. BURGDORFI ANTIBODIES: Lyme IgG/IgM Ab: <0.91 {ISR} (ref 0.00–0.90)

## 2017-01-31 LAB — VITAMIN B12: Vitamin B-12: 321 pg/mL (ref 232–1245)

## 2017-01-31 LAB — ANGIOTENSIN CONVERTING ENZYME: Angio Convert Enzyme: 26 U/L (ref 14–82)

## 2017-01-31 LAB — HIV ANTIBODY (ROUTINE TESTING W REFLEX): HIV Screen 4th Generation wRfx: NONREACTIVE

## 2017-01-31 LAB — ANA W/REFLEX: Anti Nuclear Antibody(ANA): NEGATIVE

## 2017-02-19 ENCOUNTER — Telehealth: Payer: Self-pay | Admitting: Physician Assistant

## 2017-02-19 NOTE — Telephone Encounter (Signed)
New message       Pt states that at her last ov with Orpha Bur, she was told to call us when she received medicaid and we would call her in a new medication for her heart.  She now has medicaid.  Please call in presc to community health and wellness.

## 2017-02-19 NOTE — Telephone Encounter (Signed)
Great! I wanted to start her on Entresto, but I think she needs to be seen by pharmacy or APP before starting and decide what dose. She will need to stop Losartan. Can we get her into see someone to start and give her some samples as well? Thanks!

## 2017-02-19 NOTE — Telephone Encounter (Signed)
Returned pts call to let her know that Carlean Jews, PA-C recommended her to come in and see a PA and get switched to the new medication that [name] wanted to start her on back in July. Pt has appt 02/22/17 with Vin Bhagat, PA-C, have pt arrive 10:45 for 11:00 apt.

## 2017-02-20 ENCOUNTER — Encounter: Payer: Self-pay | Admitting: Family Medicine

## 2017-02-20 ENCOUNTER — Ambulatory Visit (INDEPENDENT_AMBULATORY_CARE_PROVIDER_SITE_OTHER): Payer: Medicaid Other | Admitting: Family Medicine

## 2017-02-20 VITALS — BP 140/90 | HR 89 | Temp 98.0°F | Resp 14 | Ht 64.0 in | Wt 115.4 lb

## 2017-02-20 DIAGNOSIS — I509 Heart failure, unspecified: Secondary | ICD-10-CM | POA: Diagnosis not present

## 2017-02-20 DIAGNOSIS — Z23 Encounter for immunization: Secondary | ICD-10-CM | POA: Diagnosis not present

## 2017-02-20 DIAGNOSIS — I10 Essential (primary) hypertension: Secondary | ICD-10-CM | POA: Diagnosis not present

## 2017-02-20 DIAGNOSIS — I5042 Chronic combined systolic (congestive) and diastolic (congestive) heart failure: Secondary | ICD-10-CM | POA: Diagnosis not present

## 2017-02-20 MED ORDER — ALBUTEROL SULFATE HFA 108 (90 BASE) MCG/ACT IN AERS: 2.0000 | INHALATION_SPRAY | RESPIRATORY_TRACT | 0 refills | Status: DC | PRN

## 2017-02-20 NOTE — Progress Notes (Signed)
Patient ID: [name], female    DOB: 12/17/1975, 41 y.o.   MRN: [id number]  PCP: Bing Neighbors, FNP  Chief Complaint  Patient presents with  . Follow-up    Subjective:  HPI Brentlee DESHANTE CASSELL is a 41 y.o. female presents for evaluation of chronic disease management and follow-up. Karron suffers from hypertension and chronic congestive heart failure related to prolonged untreated hypertension.  Triana reports today overall improved of symptoms since her last office visit. She has diligently made efforts to reduce sodium and fluid intake, increase physical activity, and make conscious lifestyle choices to improve her overall health. Janiah reports compliance with medication regimen. She has recently been approved for medicaid and her cardiologist is planning to transition her to [facility] for better management of CHF. Today, she denies any recent episodes of shortness of breath, coughing, or lower extremity swelling. Continues to experience fatigue with increased physical activity, which resolves with rest.  Social History   Social History  . Marital status: Single    Spouse name: N/A  . Number of children: 2  . Years of education: 11   Occupational History  . Unemployed    Social History Main Topics  . Smoking status: Current Every Day Smoker    Packs/day: 0.30    Years: 10.00    Types: Cigarettes  . Smokeless tobacco: Never Used  . 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 Drug use: No  . Sexual activity: Yes    Birth control/ protection: None   Other Topics Concern  . Not on file   Social History Narrative   Lives with mother, Elease Hashimoto   Caffeine use: Tea daily   Left handed    Family History  Problem Relation Age of Onset  . Hypertension Mother   . Hypertension Maternal Grandmother   . Hypertension Maternal Grandfather   . Other Neg Hx    Review of Systems See  HPI  Patient Active Problem List   Diagnosis Date Noted   . Common migraine with intractable migraine 01/25/2017  . Hypertension 12/27/2016  . Chronic combined systolic and diastolic congestive heart failure (HCC) 11/09/2016  . Chest pain 11/08/2016  . Hypertensive urgency 11/08/2016  . Admission for sterilization 08/17/2012  . Spontaneous vaginal delivery 08/16/2012  . Consultation for sterilization 07/25/2012  . Poor fetal growth, affecting management of mother, antepartum condition or complication 07/11/2012  . Chronic hypertension complicating or reason for care during pregnancy 04/30/2012  . Supervision of high-risk pregnancy 04/30/2012  . Sickle cell trait (HCC) 04/30/2012    No Known Allergies  Prior to Admission medications   Medication Sig Start Date End Date Taking? Authorizing Provider  albuterol (PROVENTIL HFA;VENTOLIN HFA) 108 (90 Base) MCG/ACT inhaler Inhale 2 puffs into the lungs every 4 (four) hours as needed for wheezing or shortness of breath (cough, shortness of breath or wheezing.). 12/19/16  Yes Bing Neighbors, FNP  buPROPion [facility] SR) 150 MG 12 hr tablet Take 1 tablet (150 mg total) by mouth 2 (two) times daily. 12/19/16  Yes Bing Neighbors, FNP  carvedilol (COREG) 25 MG tablet Take 1 tablet (25 mg total) by mouth 2 (two) times daily with a meal. 12/19/16  Yes Bing Neighbors, FNP  furosemide (LASIX) 40 MG tablet Take 1 tablet (40 mg total) by mouth daily. 12/19/16  Yes Bing Neighbors, FNP  gabapentin (NEURONTIN) 300 MG capsule Take 1 capsule (300 mg total) by  mouth 2 (two) times daily. 01/25/17  Yes York Spaniel, MD  hydrALAZINE (APRESOLINE) 25 MG tablet Take 1 tablet (25 mg total) by mouth 3 (three) times daily. 12/20/16 03/20/17 Yes Janetta Hora, PA-C  nicotine (NICODERM CQ - DOSED IN MG/24 HOURS) 21 mg/24hr patch Place 1 patch (21 mg total) onto the skin daily. Make substitute generic 12/19/16  Yes Bing Neighbors, FNP  potassium chloride (K-DUR) 10 MEQ tablet Take 10 mEq by mouth daily.  11/24/16  Yes [provider]  spironolactone (ALDACTONE) 25 MG tablet Take 1 tablet (25 mg total) by mouth daily. 11/28/16 02/26/17 Yes Janetta Hora, PA-C  losartan (COZAAR) 100 MG tablet Take 1 tablet (100 mg total) by mouth daily. 12/19/16 01/18/17  Bing Neighbors, FNP    Past Medical, Surgical Family and Social History reviewed and updated.    Objective:   Today's Vitals   02/20/17 1321  BP: 140/90  Pulse: 89  Resp: 14  Temp: 98 F (36.7 C)  TempSrc: Oral  SpO2: 100%  Weight: 115 lb 6.4 oz (52.3 kg)  Height: 5\' 4"  (1.626 m)    Wt Readings from Last 3 Encounters:  02/20/17 115 lb 6.4 oz (52.3 kg)  01/25/17 116 lb 8 oz (52.8 kg)  12/20/16 116 lb (52.6 kg)    Physical Exam  Constitutional: She is oriented to person, place, and time. She appears well-developed and well-nourished.  HENT:  Head: Normocephalic and atraumatic.  Eyes: Pupils are equal, round, and reactive to light. Conjunctivae and EOM are normal.  Neck: Normal range of motion. Neck supple.  Cardiovascular: Normal rate, regular rhythm, normal heart sounds and intact distal pulses.   Pulmonary/Chest: Effort normal and breath sounds normal.  Abdominal: Soft. She exhibits no distension.  Musculoskeletal: Normal range of motion.  Neurological: She is alert and oriented to person, place, and time.  Skin: Skin is warm and dry.  Psychiatric: She has a normal mood and affect. Her behavior is normal. Judgment and thought content normal.   Assessment & Plan:  1. Chronic combined systolic and diastolic CHF (congestive heart failure) (HCC) - Basic metabolic panel - Brain natriuretic peptide -Keep follow-up with Cardiology   2. Essential hypertension, stable -Continue current, regimen.  3. Need for influenza vaccination - Flu Vaccine QUAD 36+ mos IM  RTC: 3 months for follow-up of chronic conditions and PAP   Godfrey Pick. Tiburcio Pea, MSN, FNP-C [facility]  [address] [place], [place] [address] [phone]

## 2017-02-20 NOTE — Patient Instructions (Signed)
DASH Eating Plan DASH stands for "Dietary Approaches to Stop Hypertension." The DASH eating plan is a healthy eating plan that has been shown to reduce high blood pressure (hypertension). It may also reduce your risk for type 2 diabetes, heart disease, and stroke. The DASH eating plan may also help with weight loss. What are tips for following this plan? General guidelines  Avoid eating more than 2,300 mg (milligrams) of salt (sodium) a day. If you have hypertension, you may need to reduce your sodium intake to 1,500 mg a day.  Limit alcohol intake to no more than 1 drink a day for nonpregnant women and 2 drinks a day for men. One drink equals 12 oz of beer, 5 oz of wine, or 1 oz of hard liquor.  Work with your health care provider to maintain a healthy body weight or to lose weight. Ask what an ideal weight is for you.  Get at least 30 minutes of exercise that causes your heart to beat faster (aerobic exercise) most days of the week. Activities may include walking, swimming, or biking.  Work with your health care provider or diet and nutrition specialist (dietitian) to adjust your eating plan to your individual calorie needs. Reading food labels  Check food labels for the amount of sodium per serving. Choose foods with less than 5 percent of the Daily Value of sodium. Generally, foods with less than 300 mg of sodium per serving fit into this eating plan.  To find whole grains, look for the word "whole" as the first word in the ingredient list. Shopping  Buy products labeled as "low-sodium" or "no salt added."  Buy fresh foods. Avoid canned foods and premade or frozen meals. Cooking  Avoid adding salt when cooking. Use salt-free seasonings or herbs instead of table salt or sea salt. Check with your health care provider or pharmacist before using salt substitutes.  Do not fry foods. Cook foods using healthy methods such as baking, boiling, grilling, and broiling instead.  Cook with  heart-healthy oils, such as olive, canola, soybean, or sunflower oil. Meal planning   Eat a balanced diet that includes: ? 5 or more servings of fruits and vegetables each day. At each meal, try to fill half of your plate with fruits and vegetables. ? Up to 6-8 servings of whole grains each day. ? Less than 6 oz of lean meat, poultry, or fish each day. A 3-oz serving of meat is about the same size as a deck of cards. One egg equals 1 oz. ? 2 servings of low-fat dairy each day. ? A serving of nuts, seeds, or beans 5 times each week. ? Heart-healthy fats. Healthy fats called Omega-3 fatty acids are found in foods such as flaxseeds and coldwater fish, like sardines, salmon, and mackerel.  Limit how much you eat of the following: ? Canned or prepackaged foods. ? Food that is high in trans fat, such as fried foods. ? Food that is high in saturated fat, such as fatty meat. ? Sweets, desserts, sugary drinks, and other foods with added sugar. ? Full-fat dairy products.  Do not salt foods before eating.  Try to eat at least 2 vegetarian meals each week.  Eat more home-cooked food and less restaurant, buffet, and fast food.  When eating at a restaurant, ask that your food be prepared with less salt or no salt, if possible. What foods are recommended? The items listed may not be a complete list. Talk with your dietitian about what   dietary choices are best for you. Grains Whole-grain or whole-wheat bread. Whole-grain or whole-wheat pasta. Brown rice. Oatmeal. Quinoa. Bulgur. Whole-grain and low-sodium cereals. Pita bread. Low-fat, low-sodium crackers. Whole-wheat flour tortillas. Vegetables Fresh or frozen vegetables (raw, steamed, roasted, or grilled). Low-sodium or reduced-sodium tomato and vegetable juice. Low-sodium or reduced-sodium tomato sauce and tomato paste. Low-sodium or reduced-sodium canned vegetables. Fruits All fresh, dried, or frozen fruit. Canned fruit in natural juice (without  added sugar). Meat and other protein foods Skinless chicken or turkey. Ground chicken or turkey. Pork with fat trimmed off. Fish and seafood. Egg whites. Dried beans, peas, or lentils. Unsalted nuts, nut butters, and seeds. Unsalted canned beans. Lean cuts of beef with fat trimmed off. Low-sodium, lean deli meat. Dairy Low-fat (1%) or fat-free (skim) milk. Fat-free, low-fat, or reduced-fat cheeses. Nonfat, low-sodium ricotta or cottage cheese. Low-fat or nonfat yogurt. Low-fat, low-sodium cheese. Fats and oils Soft margarine without trans fats. Vegetable oil. Low-fat, reduced-fat, or light mayonnaise and salad dressings (reduced-sodium). Canola, safflower, olive, soybean, and sunflower oils. Avocado. Seasoning and other foods Herbs. Spices. Seasoning mixes without salt. Unsalted popcorn and pretzels. Fat-free sweets. What foods are not recommended? The items listed may not be a complete list. Talk with your dietitian about what dietary choices are best for you. Grains Baked goods made with fat, such as croissants, muffins, or some breads. Dry pasta or rice meal packs. Vegetables Creamed or fried vegetables. Vegetables in a cheese sauce. Regular canned vegetables (not low-sodium or reduced-sodium). Regular canned tomato sauce and paste (not low-sodium or reduced-sodium). Regular tomato and vegetable juice (not low-sodium or reduced-sodium). Pickles. Olives. Fruits Canned fruit in a light or heavy syrup. Fried fruit. Fruit in cream or butter sauce. Meat and other protein foods Fatty cuts of meat. Ribs. Fried meat. Bacon. Sausage. Bologna and other processed lunch meats. Salami. Fatback. Hotdogs. Bratwurst. Salted nuts and seeds. Canned beans with added salt. Canned or smoked fish. Whole eggs or egg yolks. Chicken or turkey with skin. Dairy Whole or 2% milk, cream, and half-and-half. Whole or full-fat cream cheese. Whole-fat or sweetened yogurt. Full-fat cheese. Nondairy creamers. Whipped toppings.  Processed cheese and cheese spreads. Fats and oils Butter. Stick margarine. Lard. Shortening. Ghee. Bacon fat. Tropical oils, such as coconut, palm kernel, or palm oil. Seasoning and other foods Salted popcorn and pretzels. Onion salt, garlic salt, seasoned salt, table salt, and sea salt. Worcestershire sauce. Tartar sauce. Barbecue sauce. Teriyaki sauce. Soy sauce, including reduced-sodium. Steak sauce. Canned and packaged gravies. Fish sauce. Oyster sauce. Cocktail sauce. Horseradish that you find on the shelf. Ketchup. Mustard. Meat flavorings and tenderizers. Bouillon cubes. Hot sauce and Tabasco sauce. Premade or packaged marinades. Premade or packaged taco seasonings. Relishes. Regular salad dressings. Where to find more information:  National Heart, Lung, and Blood Institute: www.nhlbi.nih.gov  American Heart Association: www.heart.org Summary  The DASH eating plan is a healthy eating plan that has been shown to reduce high blood pressure (hypertension). It may also reduce your risk for type 2 diabetes, heart disease, and stroke.  With the DASH eating plan, you should limit salt (sodium) intake to 2,300 mg a day. If you have hypertension, you may need to reduce your sodium intake to 1,500 mg a day.  When on the DASH eating plan, aim to eat more fresh fruits and vegetables, whole grains, lean proteins, low-fat dairy, and heart-healthy fats.  Work with your health care provider or diet and nutrition specialist (dietitian) to adjust your eating plan to your individual   calorie needs. This information is not intended to replace advice given to you by your health care provider. Make sure you discuss any questions you have with your health care provider. Document Released: 06/08/2011 Document Revised: 06/12/2016 Document Reviewed: 06/12/2016 Elsevier Interactive Patient Education  2017 [employer] Inc.   Heart Failure Heart failure means your heart has trouble pumping blood. This makes it hard  for your body to work well. Heart failure is usually a long-term (chronic) condition. You must take good care of yourself and follow your doctor's treatment plan. Follow these instructions at home:  Take your heart medicine as told by your doctor. ? Do not stop taking medicine unless your doctor tells you to. ? Do not skip any dose of medicine. ? Refill your medicines before they run out. ? Take other medicines only as told by your doctor or pharmacist.  Stay active if told by your doctor. The elderly and people with severe heart failure should talk with a doctor about physical activity.  Eat heart-healthy foods. Choose foods that are without trans fat and are low in saturated fat, cholesterol, and salt (sodium). This includes fresh or frozen fruits and vegetables, fish, lean meats, fat-free or low-fat dairy foods, whole grains, and high-fiber foods. Lentils and dried peas and beans (legumes) are also good choices.  Limit salt if told by your doctor.  Cook in a healthy way. Roast, grill, broil, bake, poach, steam, or stir-fry foods.  Limit fluids as told by your doctor.  Weigh yourself every morning. Do this after you pee (urinate) and before you eat breakfast. Write down your weight to give to your doctor.  Take your blood pressure and write it down if your doctor tells you to.  Ask your doctor how to check your pulse. Check your pulse as told.  Lose weight if told by your doctor.  Stop smoking or chewing tobacco. Do not use gum or patches that help you quit without your doctor's approval.  Schedule and go to doctor visits as told.  Nonpregnant women should have no more than 1 drink a day. Men should have no more than 2 drinks a day. Talk to your doctor about drinking alcohol.  Stop illegal drug use.  Stay current with shots (immunizations).  Manage your health conditions as told by your doctor.  Learn to manage your stress.  Rest when you are tired.  If it is really hot  outside: ? Avoid intense activities. ? Use air conditioning or fans, or get in a cooler place. ? Avoid caffeine and alcohol. ? Wear loose-fitting, lightweight, and light-colored clothing.  If it is really cold outside: ? Avoid intense activities. ? Layer your clothing. ? Wear mittens or gloves, a hat, and a scarf when going outside. ? Avoid alcohol.  Learn about heart failure and get support as needed.  Get help to maintain or improve your quality of life and your ability to care for yourself as needed. Contact a doctor if:  You gain weight quickly.  You are more short of breath than usual.  You cannot do your normal activities.  You tire easily.  You cough more than normal, especially with activity.  You have any or more puffiness (swelling) in areas such as your hands, feet, ankles, or belly (abdomen).  You cannot sleep because it is hard to breathe.  You feel like your heart is beating fast (palpitations).  You get dizzy or light-headed when you stand up. Get help right away if:  You  have trouble breathing.  There is a change in mental status, such as becoming less alert or not being able to focus.  You have chest pain or discomfort.  You faint. This information is not intended to replace advice given to you by your health care provider. Make sure you discuss any questions you have with your health care provider. Document Released: 03/28/2008 Document Revised: 11/25/2015 Document Reviewed: 08/05/2012 Elsevier Interactive Patient Education  2017 [employer].

## 2017-02-21 ENCOUNTER — Encounter: Payer: Self-pay | Admitting: Physician Assistant

## 2017-02-21 LAB — BASIC METABOLIC PANEL
BUN: 16 mg/dL (ref 7–25)
CO2: 22 mmol/L (ref 20–32)
Calcium: 9.8 mg/dL (ref 8.6–10.2)
Chloride: 107 mmol/L (ref 98–110)
Creat: 0.81 mg/dL (ref 0.50–1.10)
Glucose, Bld: 79 mg/dL (ref 65–99)
Potassium: 5.2 mmol/L (ref 3.5–5.3)
Sodium: 139 mmol/L (ref 135–146)

## 2017-02-21 LAB — BRAIN NATRIURETIC PEPTIDE: Brain Natriuretic Peptide: 11.4 pg/mL (ref <100)

## 2017-02-21 NOTE — Progress Notes (Signed)
Cardiology Office Note    Date:  02/22/2017   ID:  [name], DOB [birth year], MRN [id number]  PCP:  Bing Neighbors, FNP  Cardiologist:  Dr. Eden Emms   Chief Complaint: BP medication change  History of Present Illness:   [name] is a 41 y.o. female with a history of HTN, sickle cell trait, tobacco abuse, GERD and  Dilated cardiomyopathy presented to discuss medications.   Admitted 10/2016 with chest pain. She was found to be markedly hypertensive 2/2 non compliance with medications. 2D ECHO showed EF 35-40%, mild LVH, G2DD, mild RV dysfunction. 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 She was discharged on labetalol 400mg  BID and Losartan 100mg  daily.   Later her labetalol changed to coreg and added spironolactone.   She is doing well on cardiac stand point when last seen by Cline Crock 12/20/16. Plan to start [facility] when she gets Medicaid.   Here today for further discussion. She is not checking her BP at home. Denies chest pain, Palpitations, lower extremity edema, dizziness or syncope. His intermittent orthopnea and PND. She is trying to cut back on salt intake. Compliant with medication.    Past Medical History:  Diagnosis Date  . Anemia   . Common migraine with intractable migraine 01/25/2017  . GERD (gastroesophageal reflux disease)   . Hypertension   . Miscarriage    x3  . Sickle cell trait [facility])     Past Surgical History:  Procedure Laterality Date  . BREAST SURGERY     cyst removal 1990  . DILATION AND CURETTAGE OF UTERUS     four miscarriages  . TUBAL LIGATION Bilateral 08/17/2012   Procedure: POST PARTUM TUBAL LIGATION;  Surgeon: Allie Bossier, MD;  Location: [facility];  Service: Gynecology;  Laterality: Bilateral;    Current Medications: Prior to Admission medications   Medication Sig Start Date End Date Taking? Authorizing Provider  albuterol (PROVENTIL HFA;VENTOLIN HFA) 108 (90 Base) MCG/ACT inhaler Inhale 2 puffs into the  lungs every 4 (four) hours as needed for wheezing or shortness of breath (cough, shortness of breath or wheezing.). 02/20/17   Bing Neighbors, FNP  buPROPion (WELLBUTRIN SR) 150 MG 12 hr tablet Take 1 tablet (150 mg total) by mouth 2 (two) times daily. 12/19/16   Bing Neighbors, FNP  carvedilol (COREG) 25 MG tablet Take 1 tablet (25 mg total) by mouth 2 (two) times daily with a meal. 12/19/16   Bing Neighbors, FNP  furosemide (LASIX) 40 MG tablet Take 1 tablet (40 mg total) by mouth daily. 12/19/16   Bing Neighbors, FNP  gabapentin (NEURONTIN) 300 MG capsule Take 1 capsule (300 mg total) by mouth 2 (two) times daily. 01/25/17   York Spaniel, MD  hydrALAZINE (APRESOLINE) 25 MG tablet Take 1 tablet (25 mg total) by mouth 3 (three) times daily. 12/20/16 03/20/17  Janetta Hora, PA-C  losartan (COZAAR) 100 MG tablet Take 1 tablet (100 mg total) by mouth daily. 12/19/16 01/18/17  Bing Neighbors, FNP  nicotine (NICODERM CQ - DOSED IN MG/24 HOURS) 21 mg/24hr patch Place 1 patch (21 mg total) onto the skin daily. Make substitute generic 12/19/16   Bing Neighbors, FNP  potassium chloride (K-DUR) 10 MEQ tablet Take 10 mEq by mouth daily. 11/24/16   [provider]  spironolactone (ALDACTONE) 25 MG tablet Take 1 tablet (25 mg total) by mouth daily. 11/28/16 02/26/17  Janetta Hora, PA-C    Allergies:  Patient has no known allergies.   Social History   Social History  . Marital status: Single    Spouse name: N/A  . Number of children: 2  . Years of education: 11   Occupational History  . Unemployed    Social History Main Topics  . Smoking status: Current Every Day Smoker    Packs/day: 0.30    Years: 10.00    Types: Cigarettes  . Smokeless tobacco: Never Used  . 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 Drug use: No  . Sexual activity: Yes    Birth control/ protection: None   Other Topics Concern  . None   Social History  Narrative   Lives with mother, Elease Hashimoto   Caffeine use: Tea daily   Left handed     Family History:  The patient's family history includes Hypertension in her maternal grandfather, maternal grandmother, and mother.   ROS:   Please see the history of present illness.    ROS All other systems reviewed and are negative.   PHYSICAL EXAM:   VS:  BP (!) 162/96   Pulse 86   Ht 5\' 4"  (1.626 m)   Wt 117 lb 6.4 oz (53.3 kg)   LMP 02/13/2017   BMI 20.15 kg/m    GEN: Well nourished, well developed, in no acute distress  HEENT: normal  Neck: no JVD, carotid bruits, or masses Cardiac: RRR; no murmurs, rubs, or gallops,no edema  Respiratory:  clear to auscultation bilaterally, normal work of breathing GI: soft, nontender, nondistended, + BS MS: no deformity or atrophy  Skin: warm and dry, no rash Neuro:  Alert and Oriented x 3, Strength and sensation are intact Psych: euthymic mood, full affect  Wt Readings from Last 3 Encounters:  02/22/17 117 lb 6.4 oz (53.3 kg)  02/20/17 115 lb 6.4 oz (52.3 kg)  01/25/17 116 lb 8 oz (52.8 kg)      Studies/Labs Reviewed:   EKG:  EKG is not  ordered today.    Recent Labs: 11/08/2016: TSH 1.712 11/14/2016: ALT 14; Hemoglobin 11.5; Platelets 352 11/28/2016: NT-Pro BNP 1,050 02/20/2017: Brain Natriuretic Peptide 11.4; BUN 16; Creat 0.81; Potassium 5.2; Sodium 139   Lipid Panel No results found for: CHOL, TRIG, HDL, CHOLHDL, VLDL, LDLCALC, LDLDIRECT  Additional studies/ records that were reviewed today include:   Echocardiogram: 11/08/16 Study Conclusions  - Left ventricle: The cavity size was normal. Wall thickness was   increased in a pattern of mild LVH. Systolic function was   moderately reduced. The estimated ejection fraction was in the   range of 35% to 40%. Features are consistent with a pseudonormal   left ventricular filling pattern, with concomitant abnormal   relaxation and increased filling pressure (grade 2 diastolic    dysfunction). - Right ventricle: Systolic function was mildly reduced.   ASSESSMENT & PLAN:    1. Dilated CM - EF 35-45%. Felt to be secondary to hypertensive cardiomyopathy.  Her BP elevated today to 162/96. Repeat 160/100. No volume overload on exam. Advised to cut back on salt.  - Stop Losartan. Start Entresto 49/51 po BID. Follow up in Hypertension clinic in 2 weeks with [facility]. Continue Coreg 25 mg twice a day, spinal Aldactone 25 mg daily, hydralazine 25 mg 3 times a day  2. HTN - AS above  3. Tobacco abuse - Trying to cut back.    Medication Adjustments/Labs and Tests Ordered: Current medicines are reviewed at  length with the patient today.  Concerns regarding medicines are outlined above.  Medication changes, Labs and Tests ordered today are listed in the Patient Instructions below. Patient Instructions  Medication Instructions:  Your physician has recommended you make the following change in your medication:   1. STOP losartan (last dose today)  2. START sacubirtril-valsartan (Entresto) 49-51 mg twice a day (SAMPLES GIVEN) (start tomorrow)   Labwork: None ordered  Testing/Procedures: None ordered  Follow-Up: Your physician wants you to follow-up in: 2 weeks with the Hypertension Clinic for Blood Pressure Management  Any Other Special Instructions Will Be Listed Below (If Applicable).     If you need a refill on your cardiac medications before your next appointment, please call your pharmacy.      Lorelei Pont, [place]  02/22/2017 11:31 AM    [facility] [address] [place], [place], [place]  [address] Phone: [phone]; Fax: [phone]

## 2017-02-22 ENCOUNTER — Other Ambulatory Visit: Payer: Self-pay

## 2017-02-22 ENCOUNTER — Encounter: Payer: Self-pay | Admitting: Physician Assistant

## 2017-02-22 ENCOUNTER — Ambulatory Visit (INDEPENDENT_AMBULATORY_CARE_PROVIDER_SITE_OTHER): Payer: Medicaid Other | Admitting: Physician Assistant

## 2017-02-22 VITALS — BP 162/96 | HR 86 | Ht 64.0 in | Wt 117.4 lb

## 2017-02-22 DIAGNOSIS — I420 Dilated cardiomyopathy: Secondary | ICD-10-CM | POA: Diagnosis not present

## 2017-02-22 DIAGNOSIS — D573 Sickle-cell trait: Secondary | ICD-10-CM

## 2017-02-22 DIAGNOSIS — I110 Hypertensive heart disease with heart failure: Secondary | ICD-10-CM | POA: Diagnosis not present

## 2017-02-22 DIAGNOSIS — Z720 Tobacco use: Secondary | ICD-10-CM | POA: Diagnosis not present

## 2017-02-22 MED ORDER — ALBUTEROL SULFATE HFA 108 (90 BASE) MCG/ACT IN AERS: 2.0000 | INHALATION_SPRAY | RESPIRATORY_TRACT | 0 refills | Status: DC | PRN

## 2017-02-22 MED ORDER — SACUBITRIL-VALSARTAN 49-51 MG PO TABS: 1.0000 | ORAL_TABLET | Freq: Two times a day (BID) | ORAL | 0 refills | Status: DC

## 2017-02-22 NOTE — Patient Instructions (Addendum)
Medication Instructions:  Your physician has recommended you make the following change in your medication:   1. STOP losartan (last dose today)  2. START sacubirtril-valsartan (Entresto) 49-51 mg twice a day (SAMPLES GIVEN) (start tomorrow)   Labwork: None ordered  Testing/Procedures: None ordered  Follow-Up: Your physician wants you to follow-up in: 2 weeks with the Hypertension Clinic for Blood Pressure Management  Any Other Special Instructions Will Be Listed Below (If Applicable).     If you need a refill on your cardiac medications before your next appointment, please call your pharmacy.

## 2017-02-22 NOTE — Telephone Encounter (Signed)
[facility] Thanks!

## 2017-03-08 ENCOUNTER — Ambulatory Visit (INDEPENDENT_AMBULATORY_CARE_PROVIDER_SITE_OTHER): Payer: Medicaid Other | Admitting: Pharmacist

## 2017-03-08 ENCOUNTER — Encounter: Payer: Self-pay | Admitting: Pharmacist

## 2017-03-08 VITALS — BP 158/94 | HR 60

## 2017-03-08 DIAGNOSIS — I10 Essential (primary) hypertension: Secondary | ICD-10-CM | POA: Diagnosis not present

## 2017-03-08 DIAGNOSIS — I5042 Chronic combined systolic (congestive) and diastolic (congestive) heart failure: Secondary | ICD-10-CM | POA: Diagnosis not present

## 2017-03-08 MED ORDER — SACUBITRIL-VALSARTAN 97-103 MG PO TABS: 1.0000 | ORAL_TABLET | Freq: Two times a day (BID) | ORAL | 0 refills | Status: DC

## 2017-03-08 NOTE — Patient Instructions (Addendum)
Return for a follow up appointment in 2 weeks  Check your blood pressure at home daily (if able) and keep record of the readings.  Take your BP meds as follows: INCREASE Entresto 97/103mg  twice daily Continue all other medications as prescribed  Bring all of your meds, your BP cuff and your record of home blood pressures to your next appointment.  Exercise as you're able, try to walk approximately 30 minutes per day.  Keep salt intake to a minimum, especially watch canned and prepared boxed foods.  Eat more fresh fruits and vegetables and fewer canned items.  Avoid eating in fast food restaurants.    HOW TO TAKE YOUR BLOOD PRESSURE: . Rest 5 minutes before taking your blood pressure. .  Don't smoke or drink caffeinated beverages for at least 30 minutes before. . Take your blood pressure before (not after) you eat. . Sit comfortably with your back supported and both feet on the floor (don't cross your legs). . Elevate your arm to heart level on a table or a desk. . Use the proper sized cuff. It should fit smoothly and snugly around your bare upper arm. There should be enough room to slip a fingertip under the cuff. The bottom edge of the cuff should be 1 inch above the crease of the elbow. . Ideally, take 3 measurements at one sitting and record the average.

## 2017-03-08 NOTE — Progress Notes (Deleted)
Patient ID: [name]                 DOB: 09-20-1975                      MRN: [id number]     HPI: [name] is a 41 y.o. female patient of Dr. Eden Emms who presents to HTN clinic. PMH is significant for HTN, sickle cell trait, tobacco abuse, GERD and recently diagnosed dilated cardiomyopathy.   She was recently admitted inpatient 5/8-5/10/18. She initially presented for evaluation of chest pain. She was found to be markedly hypertensive 2/2 non compliance with medications. 2D ECHO showed EF 35-40%, mild LVH, G2DD, mild RV dysfunction. 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 She was discharged on labetalol  BID and Losartan  daily.   She was seen by PCP on 11/14/16 and complained of significant SOB. She was started on lasix  daily.   At 11/28/16 OV, spironolactone 25 mg daily was added for better blood pressure control. At  12/20/16 OV, hydralazine 25 mg TID was added for better BP control. At 02/22/17 OV, Losartan was discontinued and Entresto 49/51 was initiated. Presents for [employer] today.   Today she presents to HTN clinic and feels ***. She denies chest pain and SOB. She states her LEE is ***, and her orthopnea has ***. She denies *** dizziness, syncope, or headaches.   Current HTN meds:  Carvedilol 25 mg twice daily Furosemide 40 mg daily Hydralazine 25 mg three times daily Entresto 49/51 twice daily Spironolactone 25 mg once daily   Previously tried:  Losartan (switched to Entresto) Labetolol (switched to Coreg)  BP goal: < 130/80 mmHg  Family History: The patient's family history includes Hypertension in her maternal grandfather, maternal grandmother, and mother.    Social History: Never used smokeless tobacco, former smoker of 0.25 packs/day for 10 years. Has restarted smoking at ***.   Diet:   Exercise:   Home BP readings:   Wt Readings from Last 3 Encounters:  02/22/17 117 lb 6.4 oz (53.3 kg)  02/20/17 115 lb 6.4 oz (52.3 kg)    01/25/17 116 lb 8 oz (52.8 kg)   BP Readings from Last 3 Encounters:  02/22/17 (!) 162/96  02/20/17 140/90  01/25/17 (!) 165/90   Pulse Readings from Last 3 Encounters:  02/22/17 86  02/20/17 89  01/25/17 77    Renal function: CrCl cannot be calculated (Unknown ideal weight.).  Past Medical History:  Diagnosis Date  . Anemia   . Common migraine with intractable migraine 01/25/2017  . GERD (gastroesophageal reflux disease)   . Hypertension   . Miscarriage    x3  . Sickle cell trait [facility])     Current Outpatient Prescriptions on File Prior to Visit  Medication Sig Dispense Refill  . albuterol (PROVENTIL HFA;VENTOLIN HFA) 108 (90 Base) MCG/ACT inhaler Inhale 2 puffs into the lungs every 4 (four) hours as needed for wheezing or shortness of breath (cough, shortness of breath or wheezing.). 1 Inhaler 0  . buPROPion (WELLBUTRIN SR) 150 MG 12 hr tablet Take 1 tablet (150 mg total) by mouth 2 (two) times daily. 180 tablet 0  . carvedilol (COREG) 25 MG tablet Take 1 tablet (25 mg total) by mouth 2 (two) times daily with a meal. 180 tablet 1  . furosemide (LASIX) 40 MG tablet Take 1 tablet (40 mg total) by mouth daily. 60 tablet 3  . gabapentin (NEURONTIN) 300 MG capsule Take 1 capsule (  300 mg total) by mouth 2 (two) times daily. 60 capsule 3  . hydrALAZINE (APRESOLINE) 25 MG tablet Take 1 tablet (25 mg total) by mouth 3 (three) times daily. 90 tablet 1  . nicotine (NICODERM CQ - DOSED IN MG/24 HOURS) 21 mg/24hr patch Place 1 patch (21 mg total) onto the skin daily. Make substitute generic 28 patch 11  . potassium chloride (K-DUR) 10 MEQ tablet Take 10 mEq by mouth daily.  0  . sacubitril-valsartan (ENTRESTO) 49-51 MG Take 1 tablet by mouth 2 (two) times daily. 60 tablet 0  . spironolactone (ALDACTONE) 25 MG tablet Take 1 tablet (25 mg total) by mouth daily. 90 tablet 3   No current facility-administered medications on file prior to visit.     No Known  Allergies   Assessment/Plan: Hypertension: Blood pressure is still significantly elevated at ***.     [name] L. Marcy Salvoaymond, PharmD, MS PGY1 Pharmacy Resident [facility]

## 2017-03-08 NOTE — Progress Notes (Signed)
Patient ID: [name]                 DOB: [birth year]                      MRN: [id number]     HPI: [name] is a 41 y.o. female patient of Dr. Eden Emms who presents today for hypertension evaluation and medication management.  PMH includes HTN, sickle cell trait, tobacco abuse, GERD and dilated cardiomyopathy. 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 At her most recent OV her losartan was changed to [facility].   She presents today with her daughter. She states that she has been doing well with medication changes though she does report that at first she was having headaches but this has improved. She has had some dizziness on at the beginning of this week and more so when going from sitting to standing. She is uncertain if this is related to the medication or the heat. Overall she feels her SOB has improved steadily over the last several weeks. No chest pain.  She denies missed doses of medications as well.   She has been using Entresto samples.    Current HTN meds:  Carvedilol  BID (8am and 630pm) Furosemide  daily (8am) Hydralazine  TID (8am, 1pm, and 630pm) Entresto 49/51mg  BID (8am and 630pm) Spironolactone  daily (8am)  BP goal: <130/80  Family History: Hypertension in her maternal grandfather, maternal grandmother, and mother.   Social History: Current smoker. She smokes 3.5 cigarettes per day. She was smoking 1.5 ppd previously. She is willing to commit to quitting on Oct 1st this year. We will follow up with her. She has quit smoking weed (which was rare for her). She endorses only rare beer intake.   Diet: Most meals prepared from home and trying to decrease salt intake and fried food. Working to decrease salt. She does eat vegetables. No coffee. She does drink tea (generally about 4 glasses) and water.   Exercise: Walk for about 25 minutes per day about 3-4 times out of the week.  She is somewhat limited due to tiredness and SOB. She states this is somewhat improving.   Home BP readings: No cuff at home.   Wt Readings from Last 3 Encounters:  02/22/17 117 lb 6.4 oz (53.3 kg)  02/20/17 115 lb 6.4 oz (52.3 kg)  01/25/17 116 lb 8 oz (52.8 kg)   BP Readings from Last 3 Encounters:  03/08/17 (!) 158/94  02/22/17 (!) 162/96  02/20/17 140/90   Pulse Readings from Last 3 Encounters:  03/08/17 60  02/22/17 86  02/20/17 89    Renal function: CrCl cannot be calculated (Unknown ideal weight.).  Past Medical History:  Diagnosis Date  . Anemia   . Common migraine with intractable migraine 01/25/2017  . GERD (gastroesophageal reflux disease)   . Hypertension   . Miscarriage    x3  . Sickle cell trait [facility])     Current Outpatient Prescriptions on File Prior to Visit  Medication Sig Dispense Refill  . albuterol (PROVENTIL HFA;VENTOLIN HFA) 108 (90 Base) MCG/ACT inhaler Inhale 2 puffs into the lungs every 4 (four) hours as needed for wheezing or shortness of breath (cough, shortness of breath or wheezing.). 1 Inhaler 0  . buPROPion (WELLBUTRIN SR) 150 MG 12 hr tablet Take 1 tablet (150 mg total) by mouth 2 (two) times daily. 180 tablet 0  .  carvedilol (COREG) 25 MG tablet Take 1 tablet (25 mg total) by mouth 2 (two) times daily with a meal. 180 tablet 1  . furosemide (LASIX) 40 MG tablet Take 1 tablet (40 mg total) by mouth daily. 60 tablet 3  . gabapentin (NEURONTIN) 300 MG capsule Take 1 capsule (300 mg total) by mouth 2 (two) times daily. 60 capsule 3  . hydrALAZINE (APRESOLINE) 25 MG tablet Take 1 tablet (25 mg total) by mouth 3 (three) times daily. 90 tablet 1  . nicotine (NICODERM CQ - DOSED IN MG/24 HOURS) 21 mg/24hr patch Place 1 patch (21 mg total) onto the skin daily. Make substitute generic 28 patch 11  . potassium chloride (K-DUR) 10 MEQ tablet Take 10 mEq by mouth daily.  0  . spironolactone (ALDACTONE) 25 MG tablet Take 1 tablet (25 mg total) by mouth  daily. 90 tablet 3   No current facility-administered medications on file prior to visit.     No Known Allergies  Blood pressure (!) 158/94, pulse 60, last menstrual period 02/13/2017, SpO2 98 %.   Assessment/Plan: Hypertension/medication management: BP above goal today. BMET pending. Will increase Entresto to max dose 97/103mg  BID.  Follow up in HTN clinic in 2 weeks for repeat BMET and additional medication titration.    Thank you, Freddie ApleyKelley M. Cleatis PolkaAuten, PharmD, BCPS, CPP [facility]  03/08/2017 3:38 PM

## 2017-03-09 LAB — BASIC METABOLIC PANEL
BUN/Creatinine Ratio: 17 (ref 9–23)
BUN: 13 mg/dL (ref 6–20)
CO2: 21 mmol/L (ref 20–29)
Calcium: 9.3 mg/dL (ref 8.7–10.2)
Chloride: 107 mmol/L — ABNORMAL HIGH (ref 96–106)
Creatinine, Ser: 0.75 mg/dL (ref 0.57–1.00)
GFR calc Af Amer: 116 mL/min/{1.73_m2} (ref >59)
GFR calc non Af Amer: 101 mL/min/{1.73_m2} (ref >59)
Glucose: 87 mg/dL (ref 65–99)
Potassium: 4.3 mmol/L (ref 3.5–5.2)
Sodium: 141 mmol/L (ref 134–144)

## 2017-03-12 MED FILL — FUROSEMIDE 40 MG TABLET: 40 | 30 days supply | Qty: 30 | Fill #1

## 2017-03-12 MED FILL — BUPROPION SR 150 MG TABLET: 150 | 30 days supply | Qty: 60 | Fill #2

## 2017-03-12 MED FILL — POTASSIUM CL 10 MEQ TAB SA: 10 | 30 days supply | Qty: 30 | Fill #0

## 2017-03-12 MED FILL — SPIRONOLACTONE 25 MG TABLET: 25 | 30 days supply | Qty: 30 | Fill #1

## 2017-03-12 MED FILL — CARVEDILOL 25 MG TABLET: 25 | 30 days supply | Qty: 60 | Fill #1

## 2017-03-12 MED FILL — GABAPENTIN 300 MG CAPSULE: 300 | 30 days supply | Qty: 60 | Fill #1

## 2017-03-14 ENCOUNTER — Telehealth: Payer: Self-pay

## 2017-03-14 NOTE — Telephone Encounter (Signed)
**Note De-Identified Skyanne Welle Obfuscation** I have done an [place] [facility] ([phone]). Per Westley HummerIta we can call them back in 24 hours to get decision on PA for Entresto. Reference #[id number].

## 2017-03-23 NOTE — Telephone Encounter (Signed)
**Note De-Identified Keaden Gunnoe Obfuscation** I called [facility] and s/w Gabriella to check the status of this [place] PA. Per Coralie Common it has been approved from 03/14/17 until 03/09/18.

## 2017-03-27 ENCOUNTER — Ambulatory Visit: Payer: Medicaid Other

## 2017-03-27 NOTE — Progress Notes (Deleted)
Patient ID: [name]                 DOB: 12-30-1975                      MRN: [id number]     HPI: [name] is a 41 y.o. female patient of Dr. Eden Emms who presents today for hypertension evaluation and medication management.  PMH includes HTN, sickle cell trait, tobacco abuse, GERD and dilated cardiomyopathy. 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 At her most recent OV [facility].   At her last HTN clinic visit, her blood pressure was elevated at [address]894 mmHg and her Sherryll Burger was increased to 97/103 mg twice daily. A BMET was collected and resulted stable and within normal limits.   --- Today she presents to clinic feeling ***. She denies *** dizziness, headaches, or recent falls. She is still*** using Entresto samples.   Current HTN meds:  Carvedilol  BID (8am and 630pm) Furosemide  daily (8am) Hydralazine  TID (8am, 1pm, and 630pm) Entresto 97/103mg  BID (8am and 630pm) Spironolactone  daily (8am)  BP goal: <130/80 mmHg  Family History: Hypertension in her maternal grandfather, maternal grandmother, and mother.  Social History: Current smoker. She smokes 3.5 cigarettes per day. She was smoking 1.5 ppd previously. She is willing to commit to quitting on Oct 1st this year. We will follow up with her. She has quit smoking weed (which was rare for her). She endorses only rare beer intake.   Diet: Most meals prepared from home and trying to decrease salt intake and fried food. Working to decrease salt. She does eat vegetables. No coffee. She does drink tea (generally about 4 glasses) and water.   Exercise: Walk for about 25 minutes per day about 3-4 times out of the week. She is somewhat limited due to tiredness and SOB. She states this is somewhat improving.   Home BP readings: No cuff at home.   Wt Readings from Last 3 Encounters:  02/22/17 117 lb 6.4 oz  (53.3 kg)  02/20/17 115 lb 6.4 oz (52.3 kg)  01/25/17 116 lb 8 oz (52.8 kg)   BP Readings from Last 3 Encounters:  03/08/17 (!) 158/94  02/22/17 (!) 162/96  02/20/17 140/90   Pulse Readings from Last 3 Encounters:  03/08/17 60  02/22/17 86  02/20/17 89    Renal function: CrCl cannot be calculated (Unknown ideal weight.).  Past Medical History:  Diagnosis Date  . Anemia   . Common migraine with intractable migraine 01/25/2017  . GERD (gastroesophageal reflux disease)   . Hypertension   . Miscarriage    x3  . Sickle cell trait [facility])     Current Outpatient Prescriptions on File Prior to Visit  Medication Sig Dispense Refill  . albuterol (PROVENTIL HFA;VENTOLIN HFA) 108 (90 Base) MCG/ACT inhaler Inhale 2 puffs into the lungs every 4 (four) hours as needed for wheezing or shortness of breath (cough, shortness of breath or wheezing.). 1 Inhaler 0  . buPROPion (WELLBUTRIN SR) 150 MG 12 hr tablet Take 1 tablet (150 mg total) by mouth 2 (two) times daily. 180 tablet 0  . carvedilol (COREG) 25 MG tablet Take 1 tablet (25 mg total) by mouth 2 (two) times daily with a meal. 180 tablet 1  . furosemide (LASIX) 40 MG tablet Take 1 tablet (40 mg total) by mouth daily.  60 tablet 3  . gabapentin (NEURONTIN) 300 MG capsule Take 1 capsule (300 mg total) by mouth 2 (two) times daily. 60 capsule 3  . hydrALAZINE (APRESOLINE) 25 MG tablet Take 1 tablet (25 mg total) by mouth 3 (three) times daily. 90 tablet 1  . nicotine (NICODERM CQ - DOSED IN MG/24 HOURS) 21 mg/24hr patch Place 1 patch (21 mg total) onto the skin daily. Make substitute generic 28 patch 11  . potassium chloride (K-DUR) 10 MEQ tablet Take 10 mEq by mouth daily.  0  . sacubitril-valsartan (ENTRESTO) 97-103 MG Take 1 tablet by mouth 2 (two) times daily. 60 tablet 0  . spironolactone (ALDACTONE) 25 MG tablet Take 1 tablet (25 mg total) by mouth daily. 90 tablet 3   No current facility-administered medications on file prior to visit.       No Known Allergies   Assessment/Plan: Hypertension/medication management: BP is above goal of <130/80 mmHg today at ***.    Jaysen Wey L. Marcy Salvo, PharmD, MS PGY1 Pharmacy Resident [facility]

## 2017-04-04 ENCOUNTER — Ambulatory Visit
Admission: RE | Admit: 2017-04-04 | Discharge: 2017-04-04 | Disposition: A | Discharge status: Home / Self Care | Payer: Medicaid Other | Source: Ambulatory Visit | Attending: Neurology | Admitting: Neurology

## 2017-04-04 DIAGNOSIS — G43019 Migraine without aura, intractable, without status migrainosus: Secondary | ICD-10-CM | POA: Diagnosis not present

## 2017-04-05 ENCOUNTER — Telehealth: Payer: Self-pay | Admitting: Neurology

## 2017-04-05 NOTE — Telephone Encounter (Signed)
I called patient. MRI the brain shows minimal punctate white matter changes, may be seen with individuals with migraine, nothing that would cause headache seen on the scan.   MRI brain 04/05/17:  IMPRESSION:  Equivocal MRI brain (without) demonstrating: 1. Few punctate, scattered juxtacortical and subcortical foci of non-specific gliosis.  2. No acute findings.

## 2017-04-17 ENCOUNTER — Other Ambulatory Visit: Payer: Self-pay | Admitting: Cardiovascular Disease

## 2017-04-18 NOTE — Telephone Encounter (Signed)
In addition to a note on the rx for patient to call and schedule an appointment, I also called the pharmacy to see if they have a different contact number for the patient. They have the same number on file that is on her chart, but they will put a note on patients bag that she needs to contact the office for an appointment.

## 2017-04-18 NOTE — Telephone Encounter (Signed)
Routing to you for review before refilling as patient saw [name] on 03/08/17 and was instructed to f/u in two weeks and check BP daily (if able) and record the readings. She had a med titratration f/u appt scheduled for 03/27/17 but she was a no-show and she failed to re-schedule. Please advise. Thanks, MI

## 2017-05-01 ENCOUNTER — Ambulatory Visit: Payer: Medicaid Other | Admitting: Adult Health

## 2017-05-08 ENCOUNTER — Telehealth: Payer: Self-pay | Admitting: *Deleted

## 2017-05-08 ENCOUNTER — Ambulatory Visit: Payer: Medicaid Other | Admitting: Adult Health

## 2017-05-08 NOTE — Telephone Encounter (Signed)
Patient was no show for follow up with NP today.  

## 2017-05-09 ENCOUNTER — Encounter: Payer: Self-pay | Admitting: Adult Health

## 2017-05-28 ENCOUNTER — Ambulatory Visit: Payer: Medicaid Other | Admitting: Family Medicine

## 2017-08-28 ENCOUNTER — Emergency Department (HOSPITAL_COMMUNITY)
Admission: EM | Admit: 2017-08-28 | Discharge: 2017-08-29 | Disposition: A | Discharge status: Home / Self Care | Payer: Medicaid Other | Attending: Emergency Medicine | Admitting: Emergency Medicine

## 2017-08-28 ENCOUNTER — Emergency Department (HOSPITAL_COMMUNITY): Payer: Medicaid Other

## 2017-08-28 ENCOUNTER — Other Ambulatory Visit: Payer: Self-pay

## 2017-08-28 ENCOUNTER — Encounter (HOSPITAL_COMMUNITY): Payer: Self-pay | Admitting: Emergency Medicine

## 2017-08-28 DIAGNOSIS — D573 Sickle-cell trait: Secondary | ICD-10-CM | POA: Insufficient documentation

## 2017-08-28 DIAGNOSIS — I5042 Chronic combined systolic (congestive) and diastolic (congestive) heart failure: Secondary | ICD-10-CM | POA: Diagnosis not present

## 2017-08-28 DIAGNOSIS — Z79899 Other long term (current) drug therapy: Secondary | ICD-10-CM | POA: Diagnosis not present

## 2017-08-28 DIAGNOSIS — I110 Hypertensive heart disease with heart failure: Secondary | ICD-10-CM | POA: Insufficient documentation

## 2017-08-28 DIAGNOSIS — R0789 Other chest pain: Secondary | ICD-10-CM | POA: Diagnosis not present

## 2017-08-28 DIAGNOSIS — R079 Chest pain, unspecified: Secondary | ICD-10-CM | POA: Diagnosis present

## 2017-08-28 DIAGNOSIS — F17210 Nicotine dependence, cigarettes, uncomplicated: Secondary | ICD-10-CM | POA: Diagnosis not present

## 2017-08-28 HISTORY — DX: Heart failure, unspecified: I50.9

## 2017-08-28 LAB — CBC
HCT: 37 % (ref 36.0–46.0)
Hemoglobin: 12.2 g/dL (ref 12.0–15.0)
MCH: 27.7 pg (ref 26.0–34.0)
MCHC: 33 g/dL (ref 30.0–36.0)
MCV: 83.9 fL (ref 78.0–100.0)
Platelets: 260 10*3/uL (ref 150–400)
RBC: 4.41 MIL/uL (ref 3.87–5.11)
RDW: 15.1 % (ref 11.5–15.5)
WBC: 4.7 10*3/uL (ref 4.0–10.5)

## 2017-08-28 LAB — HEPATIC FUNCTION PANEL
ALT: 15 U/L (ref 14–54)
AST: 22 U/L (ref 15–41)
Albumin: 3.6 g/dL (ref 3.5–5.0)
Alkaline Phosphatase: 76 U/L (ref 38–126)
Bilirubin, Direct: 0.1 mg/dL (ref 0.1–0.5)
Indirect Bilirubin: 0.1 mg/dL — ABNORMAL LOW (ref 0.3–0.9)
Total Bilirubin: 0.2 mg/dL — ABNORMAL LOW (ref 0.3–1.2)
Total Protein: 7.5 g/dL (ref 6.5–8.1)

## 2017-08-28 LAB — BRAIN NATRIURETIC PEPTIDE: B Natriuretic Peptide: 60.7 pg/mL (ref 0.0–100.0)

## 2017-08-28 LAB — I-STAT BETA HCG BLOOD, ED (MC, WL, AP ONLY): I-stat hCG, quantitative: <5 m[IU]/mL (ref <5)

## 2017-08-28 LAB — BASIC METABOLIC PANEL
Anion gap: 11 (ref 5–15)
BUN: 7 mg/dL (ref 6–20)
CO2: 19 mmol/L — ABNORMAL LOW (ref 22–32)
Calcium: 8.8 mg/dL — ABNORMAL LOW (ref 8.9–10.3)
Chloride: 109 mmol/L (ref 101–111)
Creatinine, Ser: 0.74 mg/dL (ref 0.44–1.00)
GFR calc Af Amer: >60 mL/min (ref >60)
GFR calc non Af Amer: >60 mL/min (ref >60)
Glucose, Bld: 109 mg/dL — ABNORMAL HIGH (ref 65–99)
Potassium: 3.3 mmol/L — ABNORMAL LOW (ref 3.5–5.1)
Sodium: 139 mmol/L (ref 135–145)

## 2017-08-28 LAB — I-STAT TROPONIN, ED
Troponin i, poc: 0 ng/mL (ref 0.00–0.08)
Troponin i, poc: 0 ng/mL (ref 0.00–0.08)

## 2017-08-28 LAB — LIPASE, BLOOD: Lipase: 29 U/L (ref 11–51)

## 2017-08-28 MED ORDER — IOPAMIDOL (ISOVUE-370) INJECTION 76%
INTRAVENOUS | Status: AC
  Administered 2017-08-28: 100 mL
  Filled 2017-08-28: qty 100

## 2017-08-28 MED ORDER — ONDANSETRON HCL 4 MG/2ML IJ SOLN
4.0000 mg | Freq: Once | INTRAMUSCULAR | Status: AC
  Administered 2017-08-28: 4 mg via INTRAVENOUS
  Filled 2017-08-28: qty 2

## 2017-08-28 MED ORDER — KETOROLAC TROMETHAMINE 15 MG/ML IJ SOLN
15.0000 mg | Freq: Once | INTRAMUSCULAR | Status: AC
  Administered 2017-08-29: 15 mg via INTRAVENOUS
  Filled 2017-08-28: qty 1

## 2017-08-28 MED ORDER — MORPHINE SULFATE (PF) 4 MG/ML IV SOLN
4.0000 mg | Freq: Once | INTRAVENOUS | Status: AC
  Administered 2017-08-28: 4 mg via INTRAVENOUS
  Filled 2017-08-28: qty 1

## 2017-08-28 NOTE — Discharge Instructions (Signed)
Take 4 over the counter ibuprofen tablets 3 times a day or 2 over-the-counter naproxen tablets twice a day for pain. Also take tylenol 1000mg(2 extra strength) four times a day.    

## 2017-08-28 NOTE — ED Provider Notes (Signed)
[facility] EMERGENCY DEPARTMENT Provider Note   CSN: [id number] Arrival date & time: 08/28/17  1819     History   Chief Complaint Chief Complaint  Patient presents with  . Chest Pain    HPI [name] is a 42 y.o. female.  42 yo F with a chief complaint of chest pain.  This been going on since yesterday.  She describes it as anterior sharp.  Constant.  Nothing seems to make this better or worse.  She is mildly short of breath with it as well.  Has a history of heart failure and thinks this feels somewhat similar but not quite the same.  Denies any increased fluid overload.  Denies history of PE or DVT.  Denies trauma.  Denies cough congestion or fever.   The history is provided by the patient.  Chest Pain   This is a new problem. The current episode started yesterday. The problem occurs constantly. The problem has been rapidly worsening. The pain is associated with rest. The pain is present in the substernal region. The pain is at a severity of 9/10. The pain is severe. The quality of the pain is described as sharp. The pain does not radiate. Duration of episode(s) is 2 days. Associated symptoms include shortness of breath. Pertinent negatives include no dizziness, no fever, no headaches, no nausea, no palpitations and no vomiting. She has tried nothing for the symptoms. The treatment provided no relief. There are no known risk factors.  Her past medical history is significant for CHF.  Pertinent negatives for past medical history include no CAD and no MI.    Past Medical History:  Diagnosis Date  . Anemia   . CHF (congestive heart failure) (HCC)   . Common migraine with intractable migraine 01/25/2017  . GERD (gastroesophageal reflux disease)   . Hypertension   . Miscarriage    x3  . Sickle cell trait [employer])     Patient Active Problem List   Diagnosis Date Noted  . Common migraine with intractable migraine 01/25/2017  . Hypertension 12/27/2016  .  Chronic combined systolic and diastolic congestive heart failure (HCC) 11/09/2016  . Chest pain 11/08/2016  . Hypertensive urgency 11/08/2016  . Admission for sterilization 08/17/2012  . Spontaneous vaginal delivery 08/16/2012  . Consultation for sterilization 07/25/2012  . Poor fetal growth, affecting management of mother, antepartum condition or complication 07/11/2012  . Chronic hypertension complicating or reason for care during pregnancy 04/30/2012  . Supervision of high-risk pregnancy 04/30/2012  . Sickle cell trait (HCC) 04/30/2012    Past Surgical History:  Procedure Laterality Date  . BREAST SURGERY     cyst removal 1990  . DILATION AND CURETTAGE OF UTERUS     four miscarriages  . TUBAL LIGATION Bilateral 08/17/2012   Procedure: POST PARTUM TUBAL LIGATION;  Surgeon: Allie Bossier, MD;  Location: [facility];  Service: Gynecology;  Laterality: Bilateral;    OB History    Gravida Para Term Preterm AB Living   5 2 2  0 3 2   SAB TAB Ectopic Multiple Live Births   3 0 0 0 2       Home Medications    Prior to Admission medications   Medication Sig Start Date End Date Taking? Authorizing Provider  albuterol (PROVENTIL HFA;VENTOLIN HFA) 108 (90 Base) MCG/ACT inhaler Inhale 2 puffs into the lungs every 4 (four) hours as needed for wheezing or shortness of breath (cough, shortness of breath or wheezing.). Patient not  taking: Reported on 08/28/2017 02/22/17   Bing NeighborsHarris, Kimberly S, FNP  buPROPion [facility](WELLBUTRIN SR) 150 MG 12 hr tablet Take 1 tablet (150 mg total) by mouth 2 (two) times daily. Patient not taking: Reported on 08/28/2017 12/19/16   Bing NeighborsHarris, Kimberly S, FNP  carvedilol (COREG) 25 MG tablet Take 1 tablet (25 mg total) by mouth 2 (two) times daily with a meal. Patient not taking: Reported on 08/28/2017 12/19/16   Bing NeighborsHarris, Kimberly S, FNP  furosemide (LASIX) 40 MG tablet Take 1 tablet (40 mg total) by mouth daily. Patient not taking: Reported on 08/28/2017 12/19/16   Bing NeighborsHarris, Kimberly S, FNP   gabapentin (NEURONTIN) 300 MG capsule Take 1 capsule (300 mg total) by mouth 2 (two) times daily. Patient not taking: Reported on 08/28/2017 01/25/17   York SpanielWillis, Charles K, MD  hydrALAZINE (APRESOLINE) 25 MG tablet Take 1 tablet (25 mg total) by mouth 3 (three) times daily. Patient not taking: Reported on 08/28/2017 12/20/16 08/28/24  Janetta Horahompson, Kathryn R, PA-C  nicotine (NICODERM CQ - DOSED IN MG/24 HOURS) 21 mg/24hr patch Place 1 patch (21 mg total) onto the skin daily. Make substitute generic Patient not taking: Reported on 08/28/2017 12/19/16   Bing NeighborsHarris, Kimberly S, FNP  sacubitril-valsartan (ENTRESTO) 97-103 MG Take 1 tablet by mouth 2 (two) times daily. Patient needs to call and schedule an appointment for further refills. Patient not taking: Reported on 08/28/2017 04/18/17   Wendall StadeNishan, Peter C, MD  spironolactone (ALDACTONE) 25 MG tablet Take 1 tablet (25 mg total) by mouth daily. Patient not taking: Reported on 08/28/2017 11/28/16 08/28/24  Janetta Horahompson, Kathryn R, PA-C    Family History Family History  Problem Relation Age of Onset  . Hypertension Mother   . Hypertension Maternal Grandmother   . Hypertension Maternal Grandfather   . Other Neg Hx     Social History Social History   Tobacco Use  . Smoking status: Current Every Day Smoker    Packs/day: 0.30    Years: 10.00    Pack years: 3.00    Types: Cigarettes  . Smokeless tobacco: Never Used  Substance Use Topics  . 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 Drug use: No     Allergies   Patient has no known allergies.   Review of Systems Review of Systems  Constitutional: Negative for chills and fever.  HENT: Negative for congestion and rhinorrhea.   Eyes: Negative for redness and visual disturbance.  Respiratory: Positive for shortness of breath. Negative for wheezing.   Cardiovascular: Positive for chest pain. Negative for palpitations.  Gastrointestinal: Negative for nausea and vomiting.    Genitourinary: Negative for dysuria and urgency.  Musculoskeletal: Negative for arthralgias and myalgias.  Skin: Negative for pallor and wound.  Neurological: Negative for dizziness and headaches.     Physical Exam Updated Vital Signs BP (!) 199/108   Pulse 61   Temp 98.1 F (36.7 C) (Oral)   Resp 14   LMP 08/27/2017 (Exact Date)   SpO2 100%   Physical Exam  Constitutional: She is oriented to person, place, and time. She appears well-developed and well-nourished. No distress.  HENT:  Head: Normocephalic and atraumatic.  Eyes: EOM are normal. Pupils are equal, round, and reactive to light.  Neck: Normal range of motion. Neck supple.  Cardiovascular: Normal rate and regular rhythm. Exam reveals no gallop and no friction rub.  No murmur heard. Pulmonary/Chest: Effort normal. She has no wheezes. She has no rales. She exhibits  tenderness.    Abdominal: Soft. She exhibits no distension. There is no tenderness.  Musculoskeletal: She exhibits no edema or tenderness.  Neurological: She is alert and oriented to person, place, and time.  Skin: Skin is warm and dry. She is not diaphoretic.  Psychiatric: She has a normal mood and affect. Her behavior is normal.  Nursing note and vitals reviewed.    ED Treatments / Results  Labs (all labs ordered are listed, but only abnormal results are displayed) Labs Reviewed  BASIC METABOLIC PANEL - Abnormal; Notable for the following components:      Result Value   Potassium 3.3 (*)    CO2 19 (*)    Glucose, Bld 109 (*)    Calcium 8.8 (*)    All other components within normal limits  HEPATIC FUNCTION PANEL - Abnormal; Notable for the following components:   Total Bilirubin 0.2 (*)    Indirect Bilirubin 0.1 (*)    All other components within normal limits  CBC  BRAIN NATRIURETIC PEPTIDE  LIPASE, BLOOD  I-STAT TROPONIN, ED  I-STAT BETA HCG BLOOD, ED ([facility], [facility], [facility] ONLY)  I-STAT TROPONIN, ED    EKG  EKG Interpretation None        Radiology Dg Chest 2 View  Result Date: 08/28/2017 CLINICAL DATA:  Chest pain shortness of breath and bilateral upper and lower extremity numbness for 2 days. EXAM: CHEST  2 VIEW COMPARISON:  PA and lateral chest 11/10/2016. FINDINGS: Lungs are clear. Heart size is normal. No pneumothorax or pleural fluid. No bony abnormality. IMPRESSION: Negative chest. Electronically Signed   By: Drusilla Kanner M.D.   On: 08/28/2017 19:45   Ct Angio Chest Pe W And/or Wo Contrast  Result Date: 08/28/2017 CLINICAL DATA:  Chest pain and short of breath EXAM: CT ANGIOGRAPHY CHEST WITH CONTRAST TECHNIQUE: Multidetector CT imaging of the chest was performed using the standard protocol during bolus administration of intravenous contrast. Multiplanar CT image reconstructions and MIPs were obtained to evaluate the vascular anatomy. CONTRAST:  53 mL Isovue 370 intravenous COMPARISON:  Chest x-ray 08/28/2017, CT chest 11/08/2016 FINDINGS: Cardiovascular: Satisfactory opacification of the pulmonary arteries to the segmental level. No evidence of pulmonary embolism. Normal heart size. No pericardial effusion. Nonaneurysmal aorta. Mediastinum/Nodes: No enlarged mediastinal, hilar, or axillary lymph nodes. Thyroid gland, trachea, and esophagus demonstrate no significant findings. Lungs/Pleura: Lungs are clear. No pleural effusion or pneumothorax. Upper Abdomen: No acute abnormality. Musculoskeletal: No chest wall abnormality. No acute or significant osseous findings. Review of the MIP images confirms the above findings. IMPRESSION: 1. Negative for acute pulmonary embolus 2. No CT evidence for acute thoracic abnormality Electronically Signed   By: Jasmine Pang M.D.   On: 08/28/2017 22:31    Procedures Procedures (including critical care time)  Medications Ordered in ED Medications  ketorolac (TORADOL) 15 MG/ML injection 15 mg (not administered)  morphine 4 MG/ML injection 4 mg (4 mg Intravenous Given 08/28/17 2104)   ondansetron (ZOFRAN) injection 4 mg (4 mg Intravenous Given 08/28/17 2104)  iopamidol (ISOVUE-370) 76 % injection (100 mLs  Contrast Given 08/28/17 2208)     Initial Impression / Assessment and Plan / ED Course  I have reviewed the triage vital signs and the nursing notes.  Pertinent labs & imaging results that were available during my care of the patient were reviewed by me and considered in my medical decision making (see chart for details).     42 yo F with a chief complaint of chest pain.  This is been going on since yesterday.  Described as sharp and severe.  On my exam the patient appears to be in distress.  Initial troponin is negative.  Due to her distress a CT PE scan was ordered.  It was negative.  She does have some reproducible tenderness with palpation of the chest wall.  Potentially muscular skeletal nature.  No signs of fluid overload.  Delta trop negative.  Patient feeling mildly better.  Treat as musculoskeletal d/c home.   11:50 PM:  I have discussed the diagnosis/risks/treatment options with the patient and believe the pt to be eligible for discharge home to follow-up with PCP. We also discussed returning to the ED immediately if new or worsening sx occur. We discussed the sx which are most concerning (e.g., sudden worsening pain, fever, inability to tolerate by mouth) that necessitate immediate return. Medications administered to the patient during their visit and any new prescriptions provided to the patient are listed below.  Medications given during this visit Medications  ketorolac (TORADOL) 15 MG/ML injection 15 mg (not administered)  morphine 4 MG/ML injection 4 mg (4 mg Intravenous Given 08/28/17 2104)  ondansetron (ZOFRAN) injection 4 mg (4 mg Intravenous Given 08/28/17 2104)  iopamidol (ISOVUE-370) 76 % injection (100 mLs  Contrast Given 08/28/17 2208)     The patient appears reasonably screen and/or stabilized for discharge and I doubt any other medical condition  or other [facility] requiring further screening, evaluation, or treatment in the ED at this time prior to discharge.    Final Clinical Impressions(s) / ED Diagnoses   Final diagnoses:  Atypical chest pain    ED Discharge Orders    None       Melene Plan, DO 08/28/17 2350

## 2017-08-28 NOTE — ED Triage Notes (Signed)
Pt reports cp that started yesterday and became worse today but alos sob with exertion. Pt reports hx of CHF. Denies any new swelling.

## 2017-08-28 NOTE — ED Notes (Signed)
EKG done in triage. Did not cross over. Hard copy available

## 2017-10-04 ENCOUNTER — Encounter: Payer: Self-pay | Admitting: Physician Assistant

## 2017-10-04 ENCOUNTER — Ambulatory Visit (INDEPENDENT_AMBULATORY_CARE_PROVIDER_SITE_OTHER): Payer: Medicaid Other | Admitting: Physician Assistant

## 2017-10-04 VITALS — BP 192/110 | HR 96 | Ht 64.0 in | Wt 120.1 lb

## 2017-10-04 DIAGNOSIS — I110 Hypertensive heart disease with heart failure: Secondary | ICD-10-CM

## 2017-10-04 DIAGNOSIS — I5042 Chronic combined systolic (congestive) and diastolic (congestive) heart failure: Secondary | ICD-10-CM | POA: Diagnosis not present

## 2017-10-04 DIAGNOSIS — Z720 Tobacco use: Secondary | ICD-10-CM

## 2017-10-04 DIAGNOSIS — I420 Dilated cardiomyopathy: Secondary | ICD-10-CM

## 2017-10-04 MED ORDER — SACUBITRIL-VALSARTAN 97-103 MG PO TABS: 1.0000 | ORAL_TABLET | Freq: Two times a day (BID) | ORAL | 6 refills | Status: DC

## 2017-10-04 MED FILL — ENTRESTO 97 MG-103 MG TAB: 97-103 | 30 days supply | Qty: 60 | Fill #0

## 2017-10-04 NOTE — Patient Instructions (Signed)
Medication Instructions:  Your physician recommends that you continue on your current medications as directed. Please refer to the Current Medication list given to you today.   Labwork: none  Testing/Procedures: none  Follow-Up: Your physician recommends that you schedule a follow-up appointment in: 2-3 weeks in hypertension clinic and 3 months with Dr. Eden EmmsNishan    Any Other Special Instructions Will Be Listed Below (If Applicable).     If you need a refill on your cardiac medications before your next appointment, please call your pharmacy.

## 2017-10-04 NOTE — Addendum Note (Signed)
Addended by: Bartholome BillBHAGAT, Lou Irigoyen M on: 10/04/2017 03:07 PM   Modules accepted: Level of Service

## 2017-10-04 NOTE — Progress Notes (Signed)
Cardiology Office Note    Date:  10/04/2017   ID:  [name], DOB [birth year], MRN [id number]  PCP:  Bing Neighbors, FNP  Cardiologist: Dr. Eden Emms   Chief Complaint: 6 Months follow up  History of Present Illness:   [name] is a 42 y.o. female with a history of HTN, sickle cell trait, tobacco abuse, GERD and  Dilated cardiomyopathy presents for follow up.   Admitted 10/2016 with chest pain. She was found to be markedly hypertensive 2/2 non compliance with medications. 2D ECHO showed EF 35-40%, mild LVH, G2DD, mild RV dysfunction. 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   Seen be me 02/22/2017 for follow up. Elevated BP. Started on [place]. Seen in HTN clinic 03/08/17. Noted high BP??increased Entresto.   Here today for follow up. BP of 192/110. She is run out of her Entrestro 97/103 3 weeks ago. Pharmacy could no refills her medication. She has mild epigastric discomfort. Felt like "knot".  She noticed this when stretch out her arms or bends over.  She denies shortness of breath, orthopnea, PND, lower extremity edema, palpitation, dizziness, syncope.  Compliant with low-sodium diet.  He is stable.  She does not checks her blood pressure at home.  Continues to smoke.  Past Medical History:  Diagnosis Date  . Anemia   . CHF (congestive heart failure) (HCC)   . Common migraine with intractable migraine 01/25/2017  . GERD (gastroesophageal reflux disease)   . Hypertension   . Miscarriage    x3  . Sickle cell trait [facility])     Past Surgical History:  Procedure Laterality Date  . BREAST SURGERY     cyst removal 1990  . DILATION AND CURETTAGE OF UTERUS     four miscarriages  . TUBAL LIGATION Bilateral 08/17/2012   Procedure: POST PARTUM TUBAL LIGATION;  Surgeon: Allie Bossier, MD;  Location: [facility];  Service: Gynecology;  Laterality: Bilateral;    Current Medications: Prior to Admission medications   Medication Sig Start Date End Date Taking? Authorizing  Provider  albuterol (PROVENTIL HFA;VENTOLIN HFA) 108 (90 Base) MCG/ACT inhaler Inhale 2 puffs into the lungs every 4 (four) hours as needed for wheezing or shortness of breath (cough, shortness of breath or wheezing.). Patient not taking: Reported on 08/28/2017 02/22/17   Bing Neighbors, FNP  buPROPion [facility] SR) 150 MG 12 hr tablet Take 1 tablet (150 mg total) by mouth 2 (two) times daily. Patient not taking: Reported on 08/28/2017 12/19/16   Bing Neighbors, FNP  carvedilol (COREG) 25 MG tablet Take 1 tablet (25 mg total) by mouth 2 (two) times daily with a meal. Patient not taking: Reported on 08/28/2017 12/19/16   Bing Neighbors, FNP  furosemide (LASIX) 40 MG tablet Take 1 tablet (40 mg total) by mouth daily. Patient not taking: Reported on 08/28/2017 12/19/16   Bing Neighbors, FNP  gabapentin (NEURONTIN) 300 MG capsule Take 1 capsule (300 mg total) by mouth 2 (two) times daily. Patient not taking: Reported on 08/28/2017 01/25/17   York Spaniel, MD  hydrALAZINE (APRESOLINE) 25 MG tablet Take 1 tablet (25 mg total) by mouth 3 (three) times daily. Patient not taking: Reported on 08/28/2017 12/20/16 08/28/24  Janetta Hora, PA-C  nicotine (NICODERM CQ - DOSED IN MG/24 HOURS) 21 mg/24hr patch Place 1 patch (21 mg total) onto the skin daily. Make substitute generic Patient not taking: Reported on 08/28/2017 12/19/16   Bing Neighbors, FNP  sacubitril-valsartan [facility])  97-103 MG Take 1 tablet by mouth 2 (two) times daily. Patient needs to call and schedule an appointment for further refills. Patient not taking: Reported on 08/28/2017 04/18/17   Wendall Stade, MD  spironolactone (ALDACTONE) 25 MG tablet Take 1 tablet (25 mg total) by mouth daily. Patient not taking: Reported on 08/28/2017 11/28/16 08/28/24  Janetta Hora, PA-C    Allergies:   Patient has no known allergies.   Social History   Socioeconomic History  . Marital status: Single    Spouse name: Not on file   . Number of children: 2  . Years of education: 7  . Highest education level: Not on file  Occupational History  . Occupation: Unemployed  Social Needs  . Financial resource strain: Not on file  . Food insecurity:    Worry: Not on file    Inability: Not on file  . Transportation needs:    Medical: Not on file    Non-medical: Not on file  Tobacco Use  . Smoking status: Current Every Day Smoker    Packs/day: 0.30    Years: 10.00    Pack years: 3.00    Types: Cigarettes  . Smokeless tobacco: Never Used  Substance and Sexual Activity  . 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 Drug use: No  . Sexual activity: Yes    Birth control/protection: None  Lifestyle  . Physical activity:    Days per week: Not on file    Minutes per session: Not on file  . Stress: Not on file  Relationships  . Social connections:    Talks on phone: Not on file    Gets together: Not on file    Attends religious service: Not on file    Active member of club or organization: Not on file    Attends meetings of clubs or organizations: Not on file    Relationship status: Not on file  Other Topics Concern  . Not on file  Social History Narrative   Lives with mother, [name]   Caffeine use: Tea daily   Left handed     Family History:  The patient's family history includes Hypertension in her maternal grandfather, maternal grandmother, and mother.   ROS:   Please see the history of present illness.    ROS All other systems reviewed and are negative.   PHYSICAL EXAM:   VS:  BP (!) 192/110   Pulse 96   Ht 5\' 4"  (1.626 m)   Wt 120 lb 1.9 oz (54.5 kg)   SpO2 98%   BMI 20.62 kg/m    GEN: Well nourished, well developed, in no acute distress  HEENT: normal  Neck: no JVD, carotid bruits, or masses Cardiac: RRR; no murmurs, rubs, or gallops,no edema  Respiratory:  clear to auscultation bilaterally, normal work of breathing GI: soft, nontender, nondistended, +  BS MS: no deformity or atrophy  Skin: warm and dry, no rash Neuro:  Alert and Oriented x 3, Strength and sensation are intact Psych: euthymic mood, full affect  Wt Readings from Last 3 Encounters:  10/04/17 120 lb 1.9 oz (54.5 kg)  02/22/17 117 lb 6.4 oz (53.3 kg)  02/20/17 115 lb 6.4 oz (52.3 kg)      Studies/Labs Reviewed:   EKG:  EKG is not ordered today.    Recent Labs: 11/08/2016: TSH 1.712 11/28/2016: NT-Pro BNP 1,050 08/28/2017: ALT 15; B Natriuretic Peptide 60.7; BUN 7; Creatinine, Ser  0.74; Hemoglobin 12.2; Platelets 260; Potassium 3.3; Sodium 139   Lipid Panel No results found for: CHOL, TRIG, HDL, CHOLHDL, VLDL, LDLCALC, LDLDIRECT  Additional studies/ records that were reviewed today include:   Echocardiogram: 11/08/16 Study Conclusions  - Left ventricle: The cavity size was normal. Wall thickness was   increased in a pattern of mild LVH. Systolic function was   moderately reduced. The estimated ejection fraction was in the   range of 35% to 40%. Features are consistent with a pseudonormal   left ventricular filling pattern, with concomitant abnormal   relaxation and increased filling pressure (grade 2 diastolic   dysfunction). - Right ventricle: Systolic function was mildly reduced.    ASSESSMENT & PLAN:    1. Dialted cardiomyopathy/[place] combined CHF - Never had stress test. No coronary calcification on CTA of chest. Felt due to hypertensive cardiomyopathy.  Euvolemic exam.  Denies shortness of breath, orthopnea, PND or lower extremity edema. -Continue Coreg 25 mg twice daily, Spironolactone 25 mg daily and hydralazine 25 mg 3 times a day.  Resume high-dose Entresto. Consider repeat echocardiogram at follow-up to be evaluated function.  2. Hypertensive heart disease  -Elevated.  Likely due to not being on Entresto.  Will resume today.  Follow-up in blood pressure clinic in few weeks with BMET (reviwed recent ER labs).  I have strongly encourage compliance.   If ongoing elevated blood pressure consider secondary workup with renal arterial Doppler.  3. Tobacco abuse -Encourage cessation.  4. GERD - describes as "knot". Recent CTA of chest without PE. Her symptoms is atypical for angina. Advised to take OTC Nexium or zantac. She will discuss further with PCP tomorrow during office visit.   Medication Adjustments/Labs and Tests Ordered: Current medicines are reviewed at length with the patient today.  Concerns regarding medicines are outlined above.  Medication changes, Labs and Tests ordered today are listed in the Patient Instructions below. Patient Instructions  Medication Instructions:  Your physician recommends that you continue on your current medications as directed. Please refer to the Current Medication list given to you today.   Labwork: none  Testing/Procedures: none  Follow-Up: Your physician recommends that you schedule a follow-up appointment in: 2-3 weeks in hypertension clinic and 3 months with Dr. Eden EmmsNishan    Any Other Special Instructions Will Be Listed Below (If Applicable).     If you need a refill on your cardiac medications before your next appointment, please call your pharmacy.      Lorelei PontSigned, Praise Stennett, [place]PA  10/04/2017 2:59 PM    [facility] [address], [place], [place]  [address] Phone: [phone]; Fax: [phone]

## 2017-10-05 ENCOUNTER — Ambulatory Visit (INDEPENDENT_AMBULATORY_CARE_PROVIDER_SITE_OTHER): Payer: Medicaid Other | Admitting: Family Medicine

## 2017-10-05 ENCOUNTER — Encounter: Payer: Self-pay | Admitting: Family Medicine

## 2017-10-05 VITALS — BP 170/96 | HR 100 | Temp 98.0°F | Ht 64.0 in | Wt 124.0 lb

## 2017-10-05 DIAGNOSIS — R5383 Other fatigue: Secondary | ICD-10-CM

## 2017-10-05 DIAGNOSIS — Z5181 Encounter for therapeutic drug level monitoring: Secondary | ICD-10-CM

## 2017-10-05 DIAGNOSIS — Z79899 Other long term (current) drug therapy: Secondary | ICD-10-CM | POA: Diagnosis not present

## 2017-10-05 DIAGNOSIS — I5022 Chronic systolic (congestive) heart failure: Secondary | ICD-10-CM | POA: Diagnosis not present

## 2017-10-05 DIAGNOSIS — R0600 Dyspnea, unspecified: Secondary | ICD-10-CM

## 2017-10-05 DIAGNOSIS — I10 Essential (primary) hypertension: Secondary | ICD-10-CM | POA: Diagnosis not present

## 2017-10-05 LAB — POCT URINALYSIS DIP (DEVICE)
Bilirubin Urine: NEGATIVE
Glucose, UA: NEGATIVE mg/dL
Leukocytes, UA: NEGATIVE
Nitrite: NEGATIVE
Protein, ur: NEGATIVE mg/dL
Specific Gravity, Urine: 1.015 (ref 1.005–1.030)
Urobilinogen, UA: 0.2 mg/dL (ref 0.0–1.0)
pH: 6 (ref 5.0–8.0)

## 2017-10-05 MED ORDER — BUPROPION HCL ER (SR) 150 MG PO TB12: 150.0000 mg | ORAL_TABLET | Freq: Two times a day (BID) | ORAL | 1 refills | Status: DC

## 2017-10-05 MED ORDER — CLONIDINE HCL 0.1 MG PO TABS
0.1000 mg | ORAL_TABLET | Freq: Once | ORAL | Status: AC
  Administered 2017-10-05: 0.1 mg via ORAL

## 2017-10-05 MED ORDER — SPIRONOLACTONE 25 MG PO TABS: 25.0000 mg | ORAL_TABLET | Freq: Every day | ORAL | 3 refills | Status: DC

## 2017-10-05 MED ORDER — ALBUTEROL SULFATE HFA 108 (90 BASE) MCG/ACT IN AERS
2.0000 | INHALATION_SPRAY | RESPIRATORY_TRACT | 1 refills | Status: DC | PRN
Start: 2017-10-05 — End: 2018-04-08

## 2017-10-05 MED ORDER — GABAPENTIN 300 MG PO CAPS: 300.0000 mg | ORAL_CAPSULE | Freq: Three times a day (TID) | ORAL | 3 refills | Status: DC

## 2017-10-05 MED ORDER — FUROSEMIDE 40 MG PO TABS: 40.0000 mg | ORAL_TABLET | Freq: Every day | ORAL | 3 refills | Status: DC

## 2017-10-05 MED ORDER — CARVEDILOL 25 MG PO TABS: 25.0000 mg | ORAL_TABLET | Freq: Two times a day (BID) | ORAL | 1 refills | Status: DC

## 2017-10-05 MED ORDER — HYDRALAZINE HCL 25 MG PO TABS: 25.0000 mg | ORAL_TABLET | Freq: Three times a day (TID) | ORAL | 1 refills | Status: DC

## 2017-10-05 MED FILL — GABAPENTIN 300 MG CAPSULE: 300 | 30 days supply | Qty: 90 | Fill #0

## 2017-10-05 MED FILL — BUPROPION SR 150 MG TABLET: 150 | 30 days supply | Qty: 60 | Fill #0

## 2017-10-05 MED FILL — CARVEDILOL 25 MG TABS: 25 | 30 days supply | Qty: 60 | Fill #2

## 2017-10-05 MED FILL — SPIRONOLACTONE 25 MG TABS: 25 | 30 days supply | Qty: 30 | Fill #2

## 2017-10-05 MED FILL — hydrALAZINE HCL 25 MG TABS: 25 | 30 days supply | Qty: 90 | Fill #0

## 2017-10-05 MED FILL — PROVENTIL HFA 108 (90 BASE): 108 (90 BAS | 16 days supply | Qty: 7 | Fill #0

## 2017-10-05 MED FILL — FUROSEMIDE 40 MG TAB: 40 | 30 days supply | Qty: 30 | Fill #0

## 2017-10-05 NOTE — Progress Notes (Signed)
Patient ID: [name], female    DOB: Jul 12, 1975, 42 y.o.   MRN: [id number]  PCP: Bing Neighbors, FNP  Chief Complaint  Patient presents with  . Follow-up    chronic condition    Subjective:  HPI [name] is a 42 y.o. female , smoker, with hypertension, Heart failure with an EF 35%-40%, presents for 6 month follow-up of chronic conditions and wellness visit. [name]2017 for routine follow-up of Heart Failure. She was noted to have an accelerated blood pressure during visit of 192/110 at cardiologist office. Upon arrival today her blood pressure was accelerated and she reports that she has not taken her blood pressure medication this morning. She denies shortness of breath, lower extremity edema, chest pain, or abdominal distension. Continues to experience a chronic cough occasionally productive of clear mucus. She endorses orthopnea ( sleeps on two pillows) and PND symptoms. Complains of fatigue during the day. Uncertain of snoring. No prior sleep study performed.   Health maintenance  Overdue for PAP. All of other health maintenance is current. Social History   Socioeconomic History  . Marital status: Single    Spouse name: Not on file  . Number of children: 2  . Years of education: 65  . Highest education level: Not on file  Occupational History  . Occupation: Unemployed  Social Needs  . Financial resource strain: Not on file  . Food insecurity:    Worry: Not on file    Inability: Not on file  . Transportation needs:    Medical: Not on file    Non-medical: Not on file  Tobacco Use  . Smoking status: Current Every Day Smoker    Packs/day: 0.30    Years: 10.00    Pack years: 3.00    Types: Cigarettes  . Smokeless tobacco: Never Used  Substance and Sexual Activity  . 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 Drug use: No  . Sexual activity: Yes    Birth control/protection: None  Lifestyle  .  Physical activity:    Days per week: Not on file    Minutes per session: Not on file  . Stress: Not on file  Relationships  . Social connections:    Talks on phone: Not on file    Gets together: Not on file    Attends religious service: Not on file    Active member of club or organization: Not on file    Attends meetings of clubs or organizations: Not on file    Relationship status: Not on file  . Intimate partner violence:    Fear of current or ex partner: Not on file    Emotionally abused: Not on file    Physically abused: Not on file    Forced sexual activity: Not on file  Other Topics Concern  . Not on file  Social History Narrative   Lives with mother, Elease Hashimoto   Caffeine use: Tea daily   Left handed    Family History  Problem Relation Age of Onset  . Hypertension Mother   . Hypertension Maternal Grandmother   . Hypertension Maternal Grandfather   . Other Neg Hx    Review of Systems  Pertinent negatives listed  Patient Active Problem List   Diagnosis Date Noted  . Common migraine with intractable migraine 01/25/2017  . Hypertension 12/27/2016  . Chronic combined systolic and diastolic congestive heart failure (HCC)  11/09/2016  . Chest pain 11/08/2016  . Hypertensive urgency 11/08/2016  . Admission for sterilization 08/17/2012  . Spontaneous vaginal delivery 08/16/2012  . Consultation for sterilization 07/25/2012  . Poor fetal growth, affecting management of mother, antepartum condition or complication 07/11/2012  . Chronic hypertension complicating or reason for care during pregnancy 04/30/2012  . Supervision of high-risk pregnancy 04/30/2012  . Sickle cell trait (HCC) 04/30/2012    No Known Allergies  Prior to Admission medications   Medication Sig Start Date End Date Taking? Authorizing Provider  albuterol (PROVENTIL HFA;VENTOLIN HFA) 108 (90 Base) MCG/ACT inhaler Inhale 2 puffs into the lungs every 4 (four) hours as needed for wheezing or shortness of  breath (cough, shortness of breath or wheezing.). 02/22/17  Yes Bing NeighborsHarris, Tariah Transue S, FNP  buPROPion [facility](WELLBUTRIN SR) 150 MG 12 hr tablet Take 1 tablet (150 mg total) by mouth 2 (two) times daily. 12/19/16  Yes Bing NeighborsHarris, Dmoni Fortson S, FNP  carvedilol (COREG) 25 MG tablet Take 1 tablet (25 mg total) by mouth 2 (two) times daily with a meal. 12/19/16  Yes Bing NeighborsHarris, Madelyne Millikan S, FNP  furosemide (LASIX) 40 MG tablet Take 1 tablet (40 mg total) by mouth daily. 12/19/16  Yes Bing NeighborsHarris, Mandee Pluta S, FNP  gabapentin (NEURONTIN) 300 MG capsule Take 1 capsule (300 mg total) by mouth 2 (two) times daily. 01/25/17  Yes York SpanielWillis, Charles K, MD  hydrALAZINE (APRESOLINE) 25 MG tablet Take 1 tablet (25 mg total) by mouth 3 (three) times daily. 12/20/16 08/28/24 Yes Janetta Horahompson, Kathryn R, PA-C  sacubitril-valsartan (ENTRESTO) 97-103 MG Take 1 tablet by mouth 2 (two) times daily. 10/04/17  Yes Bhagat, Bhavinkumar, PA  spironolactone (ALDACTONE) 25 MG tablet Take 1 tablet (25 mg total) by mouth daily. 11/28/16 08/28/24 Yes Janetta Horahompson, Kathryn R, PA-C    Past Medical, Surgical Family and Social History reviewed and updated.    Objective:   Today's Vitals   10/05/17 0919 10/05/17 0954  BP: (!) 176/100 (!) 170/96  Pulse: 100   Temp: 98 F (36.7 C)   TempSrc: Oral   SpO2: 100%   Weight: 124 lb (56.2 kg)   Height: 5\' 4"  (1.626 m)     Wt Readings from Last 3 Encounters:  10/05/17 124 lb (56.2 kg)  10/04/17 120 lb 1.9 oz (54.5 kg)  02/22/17 117 lb 6.4 oz (53.3 kg)    Physical Exam Constitutional: Patient appears well-developed and well-nourished. No distress. HENT: Normocephalic, atraumatic, External right and left ear normal.  Eyes: Conjunctivae and EOM are normal. PERRLA, no scleral icterus. Neck: Normal ROM. Neck supple. No JVD. No tracheal deviation. No thyromegaly. CVS: RRR, S1/S2 +, no murmurs, no gallops, no carotid bruit.  Pulmonary: Effort and breath sounds normal, no stridor, rhonchi, wheezes, rales.  Abdominal: Soft. BS  +, no distension, tenderness, rebound or guarding.  Musculoskeletal: Normal range of motion. No edema and no tenderness.  Lymphadenopathy: No lymphadenopathy noted, cervical, inguinal or axillary Neuro: Alert, Oriented, stable gait, and normal coordination. Skin: Skin is warm and dry. No rash noted. Not diaphoretic. No erythema. No pallor. Psychiatric: Normal mood and affect. Behavior, judgment, thought content normal.   Assessment & Plan:  1. Accelerated essential hypertension, 170/96 on recheck. Ordered one time dose of clonidine 0.1 mg PO.  We have discussed target BP range and blood pressure goal. I have advised patient to check BP regularly and to call us back or report to clinic if the numbers are consistently higher than 140/90. We discussed the importance of compliance with medical therapy  and DASH diet recommended, consequences of uncontrolled hypertension discussed. Continue current BP medications.  2. Chronic systolic heart failure (HCC), continue management by the heart failure clinic.  -Limit fluid intake to no more than 1500 ml per day. If you experiencing greater than 3 lb weight gain within 24 hours, notify our clinic or if after hours report to the Emergency Department. -It is important to keep your blood pressure under control. Monitor blood pressure at home and if you obtain readings greater than 150/90 consecutively over a period of 3 days, notify me here at the office. -Avoid adding table salt or eating food such as can soup or frozen meals which are high in sodium. This increases fluid retention and is likely to worsen your heart failure.  3. Encounter for monitoring diuretic therapy, checking comprehensive metabolic panel to evaluate for  electrolyte abnormalities.  4. Nocturnal dyspnea 5. Other fatigue In spite of stable, medically managed heart failure, patient continues to experience nocturnal dyspnea, orthopnea, and fatigue. No prior sleep apnea diagnosis documented and  patient denies prior sleep study. Will obtain sleep study to rule out OSA as the cause of nocturnal ena and apneic episodes.    Orders Placed This Encounter  Procedures  . Comprehensive metabolic panel  . POCT urinalysis dip (device)  . Split night study    Standing Status:   Future    Standing Expiration Date:   10/06/2018    Order Specific Question:   Where should this test be performed:    Answer:   [facility]    Meds ordered this encounter  Medications  . cloNIDine (CATAPRES) tablet 0.1 mg  . spironolactone (ALDACTONE) 25 MG tablet    Sig: Take 1 tablet (25 mg total) by mouth daily.    Dispense:  90 tablet    Refill:  3    Order Specific Question:   Supervising Provider    Answer:   Quentin Angst [id number]  . hydrALAZINE (APRESOLINE) 25 MG tablet    Sig: Take 1 tablet (25 mg total) by mouth 3 (three) times daily.    Dispense:  180 tablet    Refill:  1    Order Specific Question:   Supervising Provider    Answer:   Quentin Angst [id number]  . furosemide (LASIX) 40 MG tablet    Sig: Take 1 tablet (40 mg total) by mouth daily.    Dispense:  60 tablet    Refill:  3    Order Specific Question:   Supervising Provider    Answer:   Quentin Angst [id number]  . gabapentin (NEURONTIN) 300 MG capsule    Sig: Take 1 capsule (300 mg total) by mouth 3 (three) times daily.    Dispense:  180 capsule    Refill:  3    Order Specific Question:   Supervising Provider    Answer:   Quentin Angst [id number]  . carvedilol (COREG) 25 MG tablet    Sig: Take 1 tablet (25 mg total) by mouth 2 (two) times daily with a meal.    Dispense:  180 tablet    Refill:  1    Order Specific Question:   Supervising Provider    Answer:   Quentin Angst [id number]  . buPROPion (WELLBUTRIN SR) 150 MG 12 hr tablet    Sig: Take 1 tablet (150 mg total) by mouth 2 (two) times daily.    Dispense:  180 tablet    Refill:  1  Order Specific Question:   Supervising  Provider    Answer:   Quentin Angst [id number]  . albuterol (PROVENTIL HFA;VENTOLIN HFA) 108 (90 Base) MCG/ACT inhaler    Sig: Inhale 2 puffs into the lungs every 4 (four) hours as needed for wheezing or shortness of breath (cough, shortness of breath or wheezing.).    Dispense:  1 Inhaler    Refill:  1    Order Specific Question:   Supervising Provider    Answer:   Quentin Angst [id number]     RTC : 1 month for hypertension management.   Godfrey Pick. Tiburcio Pea, MSN, FNP-C [facility]  [address] [place], [place] [address] [phone]

## 2017-10-06 LAB — COMPREHENSIVE METABOLIC PANEL
ALT: 81 IU/L — ABNORMAL HIGH (ref 0–32)
AST: 55 IU/L — ABNORMAL HIGH (ref 0–40)
Albumin/Globulin Ratio: 1.5 (ref 1.2–2.2)
Albumin: 4.4 g/dL (ref 3.5–5.5)
Alkaline Phosphatase: 91 IU/L (ref 39–117)
BUN/Creatinine Ratio: 11 (ref 9–23)
BUN: 8 mg/dL (ref 6–20)
Bilirubin Total: 0.5 mg/dL (ref 0.0–1.2)
CO2: 22 mmol/L (ref 20–29)
Calcium: 9.7 mg/dL (ref 8.7–10.2)
Chloride: 99 mmol/L (ref 96–106)
Creatinine, Ser: 0.72 mg/dL (ref 0.57–1.00)
GFR calc Af Amer: 122 mL/min/{1.73_m2} (ref >59)
GFR calc non Af Amer: 106 mL/min/{1.73_m2} (ref >59)
Globulin, Total: 3 g/dL (ref 1.5–4.5)
Glucose: 95 mg/dL (ref 65–99)
Potassium: 3.8 mmol/L (ref 3.5–5.2)
Sodium: 139 mmol/L (ref 134–144)
Total Protein: 7.4 g/dL (ref 6.0–8.5)

## 2017-10-07 ENCOUNTER — Telehealth: Payer: Self-pay | Admitting: Family Medicine

## 2017-10-07 DIAGNOSIS — R945 Abnormal results of liver function studies: Principal | ICD-10-CM

## 2017-10-07 DIAGNOSIS — R7989 Other specified abnormal findings of blood chemistry: Secondary | ICD-10-CM

## 2017-10-07 NOTE — Telephone Encounter (Signed)
Advise patient that liver enzymes are elevated. She will need to return in 6 weeks for a lab visit to repeat liver enzymes.CMP pending. If liver enzymes remain elevated, a liver ultrasound will be ordered.

## 2017-10-08 NOTE — Telephone Encounter (Signed)
Tried to contact patient number keeps ringing busy

## 2017-10-09 NOTE — Telephone Encounter (Signed)
Unable to reach patient because phone keeps ringing busy

## 2017-10-10 NOTE — Telephone Encounter (Signed)
Letter has been sent out to patient because I have been unable to reach her by phone

## 2017-10-22 NOTE — Addendum Note (Signed)
Addended by: Bing NeighborsHARRIS, Mansfield Dann S on: 10/22/2017 10:56 AM   Modules accepted: Orders

## 2017-10-30 ENCOUNTER — Ambulatory Visit: Payer: Medicaid Other

## 2017-10-30 NOTE — Progress Notes (Deleted)
Patient ID: [name]                 DOB: 1976-03-28                      MRN: [id number]     HPI: [name] is a 42 y.o. female patient of Dr. Eden Emms who presents today for hypertension follow up and medication management.  PMH includes HTN, sickle cell trait, tobacco abuse, GERD and dilated cardiomyopathy. 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 She has previously been seen in HTN clinic and did not follow up. Since that time she was seen in ED and by Chelsea Aus, PA for marked hypertension 2/2 noncompliance.  At her most recent visit she was restarted on Entresto   She presents today    Current HTN meds:  Carvedilol  BID (8am and 630pm) Furosemide  daily (8am) Hydralazine  TID (8am, 1pm, and 630pm) Entresto 97/103mg  BID (8am and 630pm) Spironolactone  daily (8am)  BP goal: <130/80  Family History: Hypertension in her maternal grandfather, maternal grandmother, and mother.   Social History: Current smoker. She smokes 3.5 cigarettes per day. She was smoking 1.5 ppd previously. She is willing to commit to quitting on Oct 1st this year. We will follow up with her. She has quit smoking weed (which was rare for her). She endorses only rare beer intake.   Diet: Most meals prepared from home and trying to decrease salt intake and fried food. Working to decrease salt. She does eat vegetables. No coffee. She does drink tea (generally about 4 glasses) and water.   Exercise: Walk for about 25 minutes per day about 3-4 times out of the week. She is somewhat limited due to tiredness and SOB. She states this is somewhat improving.   Home BP readings: No cuff at home.   Wt Readings from Last 3 Encounters:  10/05/17 124 lb (56.2 kg)  10/04/17 120 lb 1.9 oz (54.5 kg)  02/22/17 117 lb 6.4 oz (53.3 kg)   BP Readings from Last 3 Encounters:  10/05/17 (!) 170/96  10/04/17 (!) 192/110  08/29/17 (!)  188/90   Pulse Readings from Last 3 Encounters:  10/05/17 100  10/04/17 96  08/28/17 61    Renal function: CrCl cannot be calculated (Patient's most recent lab result is older than the maximum 21 days allowed.).  Past Medical History:  Diagnosis Date  . Anemia   . CHF (congestive heart failure) (HCC)   . Common migraine with intractable migraine 01/25/2017  . GERD (gastroesophageal reflux disease)   . Hypertension   . Miscarriage    x3  . Sickle cell trait (HCC)     Current Outpatient Medications on File Prior to Visit  Medication Sig Dispense Refill  . albuterol (PROVENTIL HFA;VENTOLIN HFA) 108 (90 Base) MCG/ACT inhaler Inhale 2 puffs into the lungs every 4 (four) hours as needed for wheezing or shortness of breath (cough, shortness of breath or wheezing.). 1 Inhaler 1  . buPROPion (WELLBUTRIN SR) 150 MG 12 hr tablet Take 1 tablet (150 mg total) by mouth 2 (two) times daily. 180 tablet 1  . carvedilol (COREG) 25 MG tablet Take 1 tablet (25 mg total) by mouth 2 (two) times daily with a meal. 180 tablet 1  . furosemide (LASIX) 40 MG tablet Take 1 tablet (40 mg total) by mouth daily. 60 tablet 3  . gabapentin (  NEURONTIN) 300 MG capsule Take 1 capsule (300 mg total) by mouth 3 (three) times daily. 180 capsule 3  . hydrALAZINE (APRESOLINE) 25 MG tablet Take 1 tablet (25 mg total) by mouth 3 (three) times daily. 180 tablet 1  . sacubitril-valsartan (ENTRESTO) 97-103 MG Take 1 tablet by mouth 2 (two) times daily. 60 tablet 6  . spironolactone (ALDACTONE) 25 MG tablet Take 1 tablet (25 mg total) by mouth daily. 90 tablet 3   No current facility-administered medications on file prior to visit.     No Known Allergies  There were no vitals taken for this visit.   Assessment/Plan: Hypertension/medication management:    Thank you, Freddie Apley. Cleatis Polka, PharmD, BCPS, CPP [facility]  10/30/2017 7:03 AM

## 2017-11-01 ENCOUNTER — Ambulatory Visit: Payer: Medicaid Other | Admitting: Family Medicine

## 2018-01-07 ENCOUNTER — Encounter: Payer: Self-pay | Admitting: Cardiovascular Disease

## 2018-01-15 ENCOUNTER — Other Ambulatory Visit: Payer: Self-pay | Admitting: Cardiovascular Disease

## 2018-01-15 MED ORDER — SACUBITRIL-VALSARTAN 97-103 MG PO TABS: 1.0000 | ORAL_TABLET | Freq: Two times a day (BID) | ORAL | 2 refills | Status: DC

## 2018-01-15 NOTE — Telephone Encounter (Signed)
Pt's medication was sent to pt's pharmacy as requested. Confirmation received.  °

## 2018-01-15 NOTE — Telephone Encounter (Signed)
New Message:        *STAT* If patient is at the pharmacy, call can be transferred to refill team.   1. Which medications need to be refilled? (please list name of each medication and dose if known) sacubitril-valsartan (ENTRESTO) 97-103 MG  2. Which pharmacy/location (including street and city if local pharmacy) is medication to be sent to?[facility] [place]  3. Do they need a 30 day or 90 day supply? 30

## 2018-01-17 ENCOUNTER — Telehealth: Payer: Self-pay

## 2018-01-23 ENCOUNTER — Ambulatory Visit: Payer: Medicaid Other | Admitting: Cardiovascular Disease

## 2018-01-28 NOTE — Telephone Encounter (Signed)
Letter mailed for patient to contact office for appointment with new provider for medication refills

## 2018-01-28 NOTE — Telephone Encounter (Signed)
Tried to contact patient several times and left message to find out which medication she needs

## 2018-01-28 NOTE — Telephone Encounter (Signed)
Patient number keeps ringing busy.

## 2018-01-30 MED FILL — ENTRESTO 97 MG-103 MG TAB: 97-103 | 30 days supply | Qty: 60 | Fill #0

## 2018-02-22 ENCOUNTER — Ambulatory Visit: Payer: Medicaid Other | Admitting: Family Medicine

## 2018-03-19 NOTE — Progress Notes (Signed)
Cardiology Office Note    Date:  03/27/2018   ID:  [name], DOB 09/24/1975, MRN [id number]  PCP:  Kallie Schneider, Brittany M, FNP  Cardiologist: Dr. Eden EmmsNishan   Chief Complaint: 6 Months follow up  History of Present Illness:   [name] is a 42 y.o. female with a history of HTN, sickle cell trait, tobacco abuse, GERD and  Dilated cardiomyopathy presents for follow up.   Admitted 10/2016 with chest pain. She was found to be markedly hypertensive 2/2 non compliance with medications. 2D ECHO showed EF 35-40%, mild LVH, G2DD, mild RV dysfunction. 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   Started on [place] but she runs out of medicine and not always compliant Continues to smoke  10/05/17 seen by primary still issues with taking meds and given one time dose clonidine for elevated BP  More compliant with meds labile BP with some white coat contirbution Has some atypical chest pain with knots in her chest with laughing    Past Medical History:  Diagnosis Date  . Anemia   . CHF (congestive heart failure) (HCC)   . Common migraine with intractable migraine 01/25/2017  . GERD (gastroesophageal reflux disease)   . Hypertension   . Miscarriage    x3  . Sickle cell trait [facility](HCC)     Past Surgical History:  Procedure Laterality Date  . BREAST SURGERY     cyst removal 1990  . DILATION AND CURETTAGE OF UTERUS     four miscarriages  . TUBAL LIGATION Bilateral 08/17/2012   Procedure: POST PARTUM TUBAL LIGATION;  Surgeon: Allie BossierMyra C Dove, MD;  Location: [facility];  Service: Gynecology;  Laterality: Bilateral;    Current Medications: Prior to Admission medications   Medication Sig Start Date End Date Taking? Authorizing Provider  albuterol (PROVENTIL HFA;VENTOLIN HFA) 108 (90 Base) MCG/ACT inhaler Inhale 2 puffs into the lungs every 4 (four) hours as needed for wheezing or shortness of breath (cough, shortness of breath or wheezing.). Patient not taking: Reported on 08/28/2017  02/22/17   Bing NeighborsHarris, Kimberly S, FNP  buPROPion [facility](WELLBUTRIN SR) 150 MG 12 hr tablet Take 1 tablet (150 mg total) by mouth 2 (two) times daily. Patient not taking: Reported on 08/28/2017 12/19/16   Bing NeighborsHarris, Kimberly S, FNP  carvedilol (COREG) 25 MG tablet Take 1 tablet (25 mg total) by mouth 2 (two) times daily with a meal. Patient not taking: Reported on 08/28/2017 12/19/16   Bing NeighborsHarris, Kimberly S, FNP  furosemide (LASIX) 40 MG tablet Take 1 tablet (40 mg total) by mouth daily. Patient not taking: Reported on 08/28/2017 12/19/16   Bing NeighborsHarris, Kimberly S, FNP  gabapentin (NEURONTIN) 300 MG capsule Take 1 capsule (300 mg total) by mouth 2 (two) times daily. Patient not taking: Reported on 08/28/2017 01/25/17   York SpanielWillis, Charles K, MD  hydrALAZINE (APRESOLINE) 25 MG tablet Take 1 tablet (25 mg total) by mouth 3 (three) times daily. Patient not taking: Reported on 08/28/2017 12/20/16 08/28/24  Janetta Horahompson, Kathryn R, PA-C  nicotine (NICODERM CQ - DOSED IN MG/24 HOURS) 21 mg/24hr patch Place 1 patch (21 mg total) onto the skin daily. Make substitute generic Patient not taking: Reported on 08/28/2017 12/19/16   Bing NeighborsHarris, Kimberly S, FNP  sacubitril-valsartan (ENTRESTO) 97-103 MG Take 1 tablet by mouth 2 (two) times daily. Patient needs to call and schedule an appointment for further refills. Patient not taking: Reported on 08/28/2017 04/18/17   Wendall StadeNishan, Tauri Ethington C, MD  spironolactone (ALDACTONE) 25 MG tablet Take 1 tablet (  25 mg total) by mouth daily. Patient not taking: Reported on 08/28/2017 11/28/16 08/28/24  Janetta Hora, PA-C    Allergies:   Patient has no known allergies.   Social History   Socioeconomic History  . Marital status: Single    Spouse name: Not on file  . Number of children: 2  . Years of education: 69  . Highest education level: Not on file  Occupational History  . Occupation: Unemployed  Social Needs  . Financial resource strain: Not on file  . Food insecurity:    Worry: Not on file    Inability:  Not on file  . Transportation needs:    Medical: Not on file    Non-medical: Not on file  Tobacco Use  . Smoking status: Current Every Day Smoker    Packs/day: 0.30    Years: 10.00    Pack years: 3.00    Types: Cigarettes  . Smokeless tobacco: Never Used  Substance and Sexual Activity  . 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 Drug use: No  . Sexual activity: Yes    Birth control/protection: None  Lifestyle  . Physical activity:    Days per week: Not on file    Minutes per session: Not on file  . Stress: Not on file  Relationships  . Social connections:    Talks on phone: Not on file    Gets together: Not on file    Attends religious service: Not on file    Active member of club or organization: Not on file    Attends meetings of clubs or organizations: Not on file    Relationship status: Not on file  Other Topics Concern  . Not on file  Social History Narrative   Lives with mother, [name]   Caffeine use: Tea daily   Left handed     Family History:  The patient's family history includes Hypertension in her maternal grandfather, maternal grandmother, and mother.   ROS:   Please see the history of present illness.    ROS All other systems reviewed and are negative.   PHYSICAL EXAM:   VS:  BP (!) 174/120 Comment: will retake  Pulse 95   Ht 5\' 4"  (1.626 m)   Wt 117 lb 4 oz (53.2 kg)   SpO2 99%   BMI 20.13 kg/m    Affect appropriate Healthy:  appears stated age HEENT: normal Neck supple with no adenopathy JVP normal no bruits no thyromegaly Lungs clear with no wheezing and good diaphragmatic motion Heart:  S1/S2  S4 gallop no murmur  PMI normal Abdomen: benighn, BS positve, no tenderness, no AAA no bruit.  No HSM or HJR Distal pulses intact with no bruits No edema Neuro non-focal Skin warm and dry No muscular weakness   Wt Readings from Last 3 Encounters:  03/27/18 117 lb 4 oz (53.2 kg)  10/05/17 124 lb (56.2 kg)    10/04/17 120 lb 1.9 oz (54.5 kg)      Studies/Labs Reviewed:   EKG:  EKG is not ordered today.    Recent Labs: 08/28/2017: B Natriuretic Peptide 60.7; Hemoglobin 12.2; Platelets 260 10/05/2017: ALT 81; BUN 8; Creatinine, Ser 0.72; Potassium 3.8; Sodium 139   Lipid Panel No results found for: CHOL, TRIG, HDL, CHOLHDL, VLDL, LDLCALC, LDLDIRECT  Additional studies/ records that were reviewed today include:   Echocardiogram: 11/08/16 Study Conclusions  - Left ventricle: The cavity size was normal. Wall thickness  was   increased in a pattern of mild LVH. Systolic function was   moderately reduced. The estimated ejection fraction was in the   range of 35% to 40%. Features are consistent with a pseudonormal   left ventricular filling pattern, with concomitant abnormal   relaxation and increased filling pressure (grade 2 diastolic   dysfunction). - Right ventricle: Systolic function was mildly reduced.    ASSESSMENT & PLAN:    1. Dialted cardiomyopathy/[place] combined CHF - main issue is compliance with medication EF 35-40% by TTE done 11/08/16  Continue current medication  Needs exercise myovue to r/o CAD   2. Hypertensive heart disease  -Discussed lifestyle changes DASH diet and compliance with medication   3. Tobacco abuse -Encourage cessation.counseled for less than 10 mintues   4. GERD - low carb diet pepcid f/u primary   Medication Adjustments/Labs and Tests Ordered: Current medicines are reviewed at length with the patient today.  Concerns regarding medicines are outlined above.  Medication changes, Labs and Tests ordered today are listed in the Patient Instructions below. There are no Patient Instructions on file for this visit.   Signed, Charlton Haws, MD  03/27/2018 11:50 AM    [facility] [address] [place], [place], [place]  [address] Phone: [phone]; Fax: [phone]

## 2018-03-27 ENCOUNTER — Encounter: Payer: Self-pay | Admitting: Cardiovascular Disease

## 2018-03-27 ENCOUNTER — Ambulatory Visit (INDEPENDENT_AMBULATORY_CARE_PROVIDER_SITE_OTHER): Payer: Medicaid Other | Admitting: Cardiovascular Disease

## 2018-03-27 VITALS — BP 174/120 | HR 95 | Ht 64.0 in | Wt 117.2 lb

## 2018-03-27 DIAGNOSIS — I420 Dilated cardiomyopathy: Secondary | ICD-10-CM | POA: Diagnosis not present

## 2018-03-27 NOTE — Addendum Note (Signed)
Addended by: Virl Axe, Aamani Moose L on: 03/27/2018 11:56 AM   Modules accepted: Orders

## 2018-03-27 NOTE — Patient Instructions (Signed)
Medication Instructions:  Your physician recommends that you continue on your current medications as directed. Please refer to the Current Medication list given to you today.  Labwork: NONE  Testing/Procedures: Your physician has requested that you have en exercise stress myoview. For further information please visit [email or web address]. Please follow instruction sheet, as given.  Follow-Up: Your physician wants you to follow-up in: 6 months with Dr. Nishan. You will receive a reminder letter in the mail two months in advance. If you don't receive a letter, please call our office to schedule the follow-up appointment.   If you need a refill on your cardiac medications before your next appointment, please call your pharmacy.   

## 2018-04-02 ENCOUNTER — Telehealth (HOSPITAL_COMMUNITY): Payer: Self-pay | Admitting: *Deleted

## 2018-04-02 NOTE — Telephone Encounter (Signed)
Left message on voicemail in reference to upcoming appointment scheduled for 04/05/18. Phone number given for a call back so details instructions can be given. [place] Stanger Jacqueline   

## 2018-04-05 ENCOUNTER — Ambulatory Visit (HOSPITAL_COMMUNITY): Payer: Medicaid Other | Attending: Cardiovascular Disease

## 2018-04-05 DIAGNOSIS — R079 Chest pain, unspecified: Secondary | ICD-10-CM | POA: Insufficient documentation

## 2018-04-05 DIAGNOSIS — I420 Dilated cardiomyopathy: Secondary | ICD-10-CM | POA: Diagnosis not present

## 2018-04-05 DIAGNOSIS — I2510 Atherosclerotic heart disease of native coronary artery without angina pectoris: Secondary | ICD-10-CM | POA: Insufficient documentation

## 2018-04-05 DIAGNOSIS — I10 Essential (primary) hypertension: Secondary | ICD-10-CM | POA: Diagnosis not present

## 2018-04-05 LAB — MYOCARDIAL PERFUSION IMAGING
LV dias vol: 99 mL (ref 46–106)
LV sys vol: 51 mL
Peak HR: 126 {beats}/min
Rest HR: 60 {beats}/min
SDS: 1
SRS: 0
SSS: 1
TID: 0.91

## 2018-04-05 MED ORDER — TECHNETIUM TC 99M TETROFOSMIN IV KIT
10.1000 | PACK | Freq: Once | INTRAVENOUS | Status: AC | PRN
  Administered 2018-04-05: 10.1 via INTRAVENOUS
  Filled 2018-04-05: qty 11

## 2018-04-05 MED ORDER — TECHNETIUM TC 99M TETROFOSMIN IV KIT
31.6000 | PACK | Freq: Once | INTRAVENOUS | Status: AC | PRN
  Administered 2018-04-05: 31.6 via INTRAVENOUS
  Filled 2018-04-05: qty 32

## 2018-04-05 MED ORDER — REGADENOSON 0.4 MG/5ML IV SOLN
0.4000 mg | Freq: Once | INTRAVENOUS | Status: AC
  Administered 2018-04-05: 0.4 mg via INTRAVENOUS

## 2018-04-08 ENCOUNTER — Telehealth: Payer: Self-pay

## 2018-04-08 ENCOUNTER — Ambulatory Visit (INDEPENDENT_AMBULATORY_CARE_PROVIDER_SITE_OTHER): Payer: Medicaid Other | Admitting: Family Medicine

## 2018-04-08 ENCOUNTER — Encounter: Payer: Self-pay | Admitting: Family Medicine

## 2018-04-08 VITALS — BP 162/90 | HR 76 | Temp 98.1°F | Ht 64.0 in | Wt 118.0 lb

## 2018-04-08 DIAGNOSIS — Z09 Encounter for follow-up examination after completed treatment for conditions other than malignant neoplasm: Secondary | ICD-10-CM

## 2018-04-08 DIAGNOSIS — Z0000 Encounter for general adult medical examination without abnormal findings: Secondary | ICD-10-CM

## 2018-04-08 DIAGNOSIS — R05 Cough: Secondary | ICD-10-CM

## 2018-04-08 DIAGNOSIS — I10 Essential (primary) hypertension: Secondary | ICD-10-CM | POA: Diagnosis not present

## 2018-04-08 DIAGNOSIS — I5042 Chronic combined systolic (congestive) and diastolic (congestive) heart failure: Secondary | ICD-10-CM | POA: Diagnosis not present

## 2018-04-08 DIAGNOSIS — Z131 Encounter for screening for diabetes mellitus: Secondary | ICD-10-CM

## 2018-04-08 DIAGNOSIS — R059 Cough, unspecified: Secondary | ICD-10-CM

## 2018-04-08 DIAGNOSIS — Z5181 Encounter for therapeutic drug level monitoring: Secondary | ICD-10-CM | POA: Diagnosis not present

## 2018-04-08 DIAGNOSIS — Z79899 Other long term (current) drug therapy: Secondary | ICD-10-CM

## 2018-04-08 DIAGNOSIS — Z23 Encounter for immunization: Secondary | ICD-10-CM | POA: Diagnosis not present

## 2018-04-08 LAB — POCT URINALYSIS DIP (MANUAL ENTRY)
Bilirubin, UA: NEGATIVE
Blood, UA: NEGATIVE
Glucose, UA: NEGATIVE mg/dL
Ketones, POC UA: NEGATIVE mg/dL
Leukocytes, UA: NEGATIVE
Nitrite, UA: NEGATIVE
Protein Ur, POC: NEGATIVE mg/dL
Spec Grav, UA: <=1.005 — AB (ref 1.010–1.025)
Urobilinogen, UA: 0.2 E.U./dL
pH, UA: 6 (ref 5.0–8.0)

## 2018-04-08 LAB — POCT GLYCOSYLATED HEMOGLOBIN (HGB A1C): Hemoglobin A1C: 4.9 % (ref 4.0–5.6)

## 2018-04-08 MED ORDER — CLONIDINE HCL 0.1 MG PO TABS
0.2000 mg | ORAL_TABLET | Freq: Once | ORAL | Status: AC
  Administered 2018-04-08: 0.2 mg via ORAL

## 2018-04-08 MED ORDER — FUROSEMIDE 40 MG PO TABS: 40.0000 mg | ORAL_TABLET | Freq: Every day | ORAL | 1 refills | Status: DC

## 2018-04-08 MED ORDER — GABAPENTIN 300 MG PO CAPS: 300.0000 mg | ORAL_CAPSULE | Freq: Three times a day (TID) | ORAL | 1 refills | Status: DC

## 2018-04-08 MED ORDER — SPIRONOLACTONE 25 MG PO TABS: 25.0000 mg | ORAL_TABLET | Freq: Every day | ORAL | 1 refills | Status: DC

## 2018-04-08 MED ORDER — ALBUTEROL SULFATE HFA 108 (90 BASE) MCG/ACT IN AERS: 2.0000 | INHALATION_SPRAY | RESPIRATORY_TRACT | 12 refills | Status: DC | PRN

## 2018-04-08 MED ORDER — HYDRALAZINE HCL 25 MG PO TABS: 25.0000 mg | ORAL_TABLET | Freq: Three times a day (TID) | ORAL | 1 refills | Status: DC

## 2018-04-08 MED ORDER — CARVEDILOL 25 MG PO TABS: 25.0000 mg | ORAL_TABLET | Freq: Two times a day (BID) | ORAL | 1 refills | Status: DC

## 2018-04-08 MED ORDER — BUPROPION HCL ER (SR) 150 MG PO TB12: 150.0000 mg | ORAL_TABLET | Freq: Two times a day (BID) | ORAL | 1 refills | Status: DC

## 2018-04-08 NOTE — Progress Notes (Signed)
Follow Up  Subjective:    Patient ID: [name], female    DOB: 05/24/1976, 42 y.o.   MRN: [id number]   Chief Complaint  Patient presents with  . Follow-up    chronic conditon   HPI  [name] is a 42 year old female with a past medical history of Sickle Cell Trait, Hypertension, GERD, Migraine, CHF, and Anemia. She is here for follow up.   Current Status: Since her last office visit, she is doing well with no complaints. She reports occasional cough, which she states happens at night and she usually sits up for a while for relief. Her anxiety is stable today. She denies visual changes, chest pain, shortness of breath, heart palpitations, and falls. She has occasionally headaches and dizziness with position changes. Denies severe headaches, confusion, seizures, double vision, and blurred vision, nausea and vomiting. She continues to follow up with Cardiology as needed.   She denies fevers, chills, fatigue, recent infections, weight loss, and night sweats. No reports of GI problems such as nausea, vomiting, diarrhea, and constipation. She has no reports of blood in stools, dysuria and hematuria. She denies pain today.  Past Medical History:  Diagnosis Date  . Anemia   . CHF (congestive heart failure) (HCC)   . Common migraine with intractable migraine 01/25/2017  . GERD (gastroesophageal reflux disease)   . Hypertension   . Miscarriage    x3  . Sickle cell trait (HCC)     Family History  Problem Relation Age of Onset  . Hypertension Mother   . Hypertension Maternal Grandmother   . Hypertension Maternal Grandfather   . Other Neg Hx     Social History   Socioeconomic History  . Marital status: Single    Spouse name: Not on file  . Number of children: 2  . Years of education: 105  . Highest education level: Not on file  Occupational History  . Occupation: Unemployed  Social Needs  . Financial resource strain: Not on file  . Food insecurity:    Worry: Not on file     Inability: Not on file  . Transportation needs:    Medical: Not on file    Non-medical: Not on file  Tobacco Use  . Smoking status: Current Every Day Smoker    Packs/day: 0.30    Years: 10.00    Pack years: 3.00    Types: Cigarettes  . Smokeless tobacco: Never Used  Substance and Sexual Activity  . 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 Drug use: No  . Sexual activity: Yes    Birth control/protection: None  Lifestyle  . Physical activity:    Days per week: Not on file    Minutes per session: Not on file  . Stress: Not on file  Relationships  . Social connections:    Talks on phone: Not on file    Gets together: Not on file    Attends religious service: Not on file    Active member of club or organization: Not on file    Attends meetings of clubs or organizations: Not on file    Relationship status: Not on file  . Intimate partner violence:    Fear of current or ex partner: Not on file    Emotionally abused: Not on file    Physically abused: Not on file    Forced sexual activity: Not on file  Other Topics Concern  . Not  on file  Social History Narrative   Lives with mother, [name]   Caffeine use: Tea daily   Left handed    Past Surgical History:  Procedure Laterality Date  . BREAST SURGERY     cyst removal 1990  . DILATION AND CURETTAGE OF UTERUS     four miscarriages  . TUBAL LIGATION Bilateral 08/17/2012   Procedure: POST PARTUM TUBAL LIGATION;  Surgeon: Allie Bossier, MD;  Location: [facility];  Service: Gynecology;  Laterality: Bilateral;    Immunization History  Administered Date(s) Administered  . Influenza Split 05/23/2012  . Influenza,inj,Quad PF,6+ Mos 02/20/2017, 04/08/2018  . Pneumococcal Polysaccharide-23 08/18/2012, 11/09/2016  . Tdap 06/13/2012    Current Meds  Medication Sig  . albuterol (PROVENTIL HFA;VENTOLIN HFA) 108 (90 Base) MCG/ACT inhaler Inhale 2 puffs into the lungs every 4 (four) hours as needed for  wheezing or shortness of breath (cough, shortness of breath or wheezing.).  Marland Kitchen buPROPion (WELLBUTRIN SR) 150 MG 12 hr tablet Take 1 tablet (150 mg total) by mouth 2 (two) times daily.  . carvedilol (COREG) 25 MG tablet Take 1 tablet (25 mg total) by mouth 2 (two) times daily with a meal.  . furosemide (LASIX) 40 MG tablet Take 1 tablet (40 mg total) by mouth daily.  Marland Kitchen gabapentin (NEURONTIN) 300 MG capsule Take 1 capsule (300 mg total) by mouth 3 (three) times daily.  . RESTASIS 0.05 % ophthalmic emulsion Apply to eye as directed.  . sacubitril-valsartan (ENTRESTO) 97-103 MG Take 1 tablet by mouth 2 (two) times daily.  . [DISCONTINUED] albuterol (PROVENTIL HFA;VENTOLIN HFA) 108 (90 Base) MCG/ACT inhaler Inhale 2 puffs into the lungs every 4 (four) hours as needed for wheezing or shortness of breath (cough, shortness of breath or wheezing.).  . [DISCONTINUED] buPROPion (WELLBUTRIN SR) 150 MG 12 hr tablet Take 1 tablet (150 mg total) by mouth 2 (two) times daily.  . [DISCONTINUED] carvedilol (COREG) 25 MG tablet Take 1 tablet (25 mg total) by mouth 2 (two) times daily with a meal.  . [DISCONTINUED] furosemide (LASIX) 40 MG tablet Take 1 tablet (40 mg total) by mouth daily.  . [DISCONTINUED] gabapentin (NEURONTIN) 300 MG capsule Take 1 capsule (300 mg total) by mouth 3 (three) times daily.    No Known Allergies  BP (!) 162/90   Pulse 76   Temp 98.1 F (36.7 C) (Oral)   Ht 5\' 4"  (1.626 m)   Wt 118 lb (53.5 kg)   LMP 03/25/2018   SpO2 100%   BMI 20.25 kg/m      Review of Systems  Constitutional: Negative.   Respiratory: Positive for cough (Occasional).   Cardiovascular: Negative.   Gastrointestinal: Negative.   Genitourinary: Negative.   Musculoskeletal: Negative.   Skin: Negative.   Neurological: Negative.   Psychiatric/Behavioral: Negative.    Objective:   Physical Exam  Constitutional: She is oriented to person, place, and time. She appears well-developed and well-nourished.   Neck: Normal range of motion. Neck supple.  Cardiovascular: Normal rate, regular rhythm, normal heart sounds and intact distal pulses.  Pulmonary/Chest: Effort normal and breath sounds normal.  Abdominal: Soft. Bowel sounds are normal.  Musculoskeletal: Normal range of motion.  Neurological: She is alert and oriented to person, place, and time.  Skin: Skin is warm and dry.  Psychiatric: She has a normal mood and affect. Her behavior is normal. Judgment and thought content normal.  Nursing note and vitals reviewed.   Assessment & Plan:   1. Chronic combined systolic  and diastolic congestive heart failure (HCC) She is doing well. No chest congestion noted. Heart sounds are stable today. She is in no distress today.  She continues to follow up with Dr. Eden Emms, Cardiologist as needed.   2. Essential hypertension Blood pressure is elevated today. She will begin taking antihypertensive medications as prescribed. We will administer Clonidine 0.2 mg in office today to aide in decreasing blood pressure. She is advised to report to ED if she experiences chest pain, shortness of breath, severe headaches, and heart palpitations. She will continue to decrease high sodium intake, excessive alcohol intake, increase potassium intake, smoking cessation, and increase physical activity of at least 30 minutes of cardio activity daily. She will continue to follow Heart Healthy or DASH diet. - cloNIDine (CATAPRES) tablet 0.2 mg  3. Cough Stable today. We will continue to monitor.   4. Encounter for monitoring diuretic therapy Stable. Potassium level within normal range at 3.8 today. Continue Lasix as prescribed. He will continue K+ supplement at prescribed.   5. Need for immunization against influenza - Flu Vaccine QUAD 36+ mos IM  6. Healthcare maintenance - CBC with Differential - Comprehensive metabolic panel - TSH - Vitamin D, 25-hydroxy - Vitamin B12 - Lipid Panel  7. Screening for diabetes  mellitus Hgb A1c is within normal levels at 4.9 today. He will continue to decrease foods/beverages high in sugars and carbs and follow Heart Healthy or DASH diet. Increase physical activity to at least 30 minutes cardio exercise daily.  - POCT glycosylated hemoglobin (Hb A1C) - POCT urinalysis dipstick  8. Follow up She will follow up in 1 month. She will follow up in 1 week for blood pressure re-check.   Meds ordered this encounter  Medications  . albuterol (PROVENTIL HFA;VENTOLIN HFA) 108 (90 Base) MCG/ACT inhaler    Sig: Inhale 2 puffs into the lungs every 4 (four) hours as needed for wheezing or shortness of breath (cough, shortness of breath or wheezing.).    Dispense:  1 Inhaler    Refill:  12  . buPROPion (WELLBUTRIN SR) 150 MG 12 hr tablet    Sig: Take 1 tablet (150 mg total) by mouth 2 (two) times daily.    Dispense:  60 tablet    Refill:  1  . carvedilol (COREG) 25 MG tablet    Sig: Take 1 tablet (25 mg total) by mouth 2 (two) times daily with a meal.    Dispense:  60 tablet    Refill:  1  . furosemide (LASIX) 40 MG tablet    Sig: Take 1 tablet (40 mg total) by mouth daily.    Dispense:  30 tablet    Refill:  1  . gabapentin (NEURONTIN) 300 MG capsule    Sig: Take 1 capsule (300 mg total) by mouth 3 (three) times daily.    Dispense:  90 capsule    Refill:  1  . hydrALAZINE (APRESOLINE) 25 MG tablet    Sig: Take 1 tablet (25 mg total) by mouth 3 (three) times daily.    Dispense:  90 tablet    Refill:  1  . spironolactone (ALDACTONE) 25 MG tablet    Sig: Take 1 tablet (25 mg total) by mouth daily.    Dispense:  30 tablet    Refill:  1  . cloNIDine (CATAPRES) tablet 0.2 mg    Raliegh Ip,  MSN, FNP-C [facility] [address] [place], [place] [address] [phone]

## 2018-04-08 NOTE — Telephone Encounter (Signed)
Left message for patient to call back. Will send message through MyChart.

## 2018-04-08 NOTE — Telephone Encounter (Signed)
-----   Message from Wendall Stade, MD sent at 04/05/2018  2:33 PM EDT ----- Normal myovue study with no evidence of ischemia or infarction

## 2018-04-08 NOTE — Telephone Encounter (Signed)
Follow Up:      Returning your call from today. 

## 2018-04-08 NOTE — Telephone Encounter (Signed)
Patient aware of myoview results.  

## 2018-04-09 LAB — COMPREHENSIVE METABOLIC PANEL
ALT: 17 IU/L (ref 0–32)
AST: 24 IU/L (ref 0–40)
Albumin/Globulin Ratio: 1.8 (ref 1.2–2.2)
Albumin: 4.4 g/dL (ref 3.5–5.5)
Alkaline Phosphatase: 71 IU/L (ref 39–117)
BUN/Creatinine Ratio: 12 (ref 9–23)
BUN: 9 mg/dL (ref 6–24)
Bilirubin Total: 0.3 mg/dL (ref 0.0–1.2)
CO2: 20 mmol/L (ref 20–29)
Calcium: 9.6 mg/dL (ref 8.7–10.2)
Chloride: 102 mmol/L (ref 96–106)
Creatinine, Ser: 0.76 mg/dL (ref 0.57–1.00)
GFR calc Af Amer: 113 mL/min/{1.73_m2} (ref >59)
GFR calc non Af Amer: 98 mL/min/{1.73_m2} (ref >59)
Globulin, Total: 2.5 g/dL (ref 1.5–4.5)
Glucose: 67 mg/dL (ref 65–99)
Potassium: 3.8 mmol/L (ref 3.5–5.2)
Sodium: 138 mmol/L (ref 134–144)
Total Protein: 6.9 g/dL (ref 6.0–8.5)

## 2018-04-09 LAB — CBC WITH DIFFERENTIAL/PLATELET
Basophils Absolute: 0.1 10*3/uL (ref 0.0–0.2)
Basos: 2 %
EOS (ABSOLUTE): 0.1 10*3/uL (ref 0.0–0.4)
Eos: 3 %
Hematocrit: 40.5 % (ref 34.0–46.6)
Hemoglobin: 13 g/dL (ref 11.1–15.9)
Immature Grans (Abs): 0 10*3/uL (ref 0.0–0.1)
Immature Granulocytes: 0 %
Lymphocytes Absolute: 1 10*3/uL (ref 0.7–3.1)
Lymphs: 22 %
MCH: 28.4 pg (ref 26.6–33.0)
MCHC: 32.1 g/dL (ref 31.5–35.7)
MCV: 88 fL (ref 79–97)
Monocytes Absolute: 0.3 10*3/uL (ref 0.1–0.9)
Monocytes: 7 %
Neutrophils Absolute: 2.9 10*3/uL (ref 1.4–7.0)
Neutrophils: 66 %
Platelets: 287 10*3/uL (ref 150–450)
RBC: 4.58 x10E6/uL (ref 3.77–5.28)
RDW: 13.6 % (ref 12.3–15.4)
WBC: 4.4 10*3/uL (ref 3.4–10.8)

## 2018-04-09 LAB — LIPID PANEL
Chol/HDL Ratio: 2.3 ratio (ref 0.0–4.4)
Cholesterol, Total: 189 mg/dL (ref 100–199)
HDL: 84 mg/dL (ref >39)
LDL Calculated: 82 mg/dL (ref 0–99)
Triglycerides: 115 mg/dL (ref 0–149)
VLDL Cholesterol Cal: 23 mg/dL (ref 5–40)

## 2018-04-09 LAB — TSH: TSH: 1.56 u[IU]/mL (ref 0.450–4.500)

## 2018-04-09 LAB — VITAMIN B12: Vitamin B-12: 363 pg/mL (ref 232–1245)

## 2018-04-09 LAB — VITAMIN D 25 HYDROXY (VIT D DEFICIENCY, FRACTURES): Vit D, 25-Hydroxy: 17.1 ng/mL — ABNORMAL LOW (ref 30.0–100.0)

## 2018-04-16 ENCOUNTER — Other Ambulatory Visit: Payer: Self-pay | Admitting: Family Medicine

## 2018-04-16 DIAGNOSIS — E559 Vitamin D deficiency, unspecified: Secondary | ICD-10-CM

## 2018-04-16 MED ORDER — VITAMIN D 1000 UNITS PO TABS: 1000.0000 [IU] | ORAL_TABLET | Freq: Every day | ORAL | 4 refills | Status: DC

## 2018-04-16 NOTE — Progress Notes (Signed)
Rx for Vitamin D sent to pharmacy today.  

## 2018-04-17 ENCOUNTER — Telehealth: Payer: Self-pay

## 2018-04-17 NOTE — Telephone Encounter (Signed)
Patient notified

## 2018-04-17 NOTE — Telephone Encounter (Signed)
-----   Message from Kallie Locks, FNP sent at 04/16/2018 10:17 PM EDT ----- Regarding: "Vitamin D" [name],   Please inform patient that he has a low Vitamin D level. We recommend that he takes a daily Vitamin D supplement of 1,000 IUs daily to increase vitamin d levels.     Thank you.

## 2018-05-10 ENCOUNTER — Ambulatory Visit: Payer: Medicaid Other | Admitting: Family Medicine

## 2018-11-09 ENCOUNTER — Emergency Department (HOSPITAL_COMMUNITY): Payer: Medicaid Other

## 2018-11-09 ENCOUNTER — Emergency Department (HOSPITAL_COMMUNITY)
Admission: EM | Admit: 2018-11-09 | Discharge: 2018-11-09 | Discharge status: Against Medical Advice | Payer: Medicaid Other | Attending: Emergency Medicine | Admitting: Emergency Medicine

## 2018-11-09 ENCOUNTER — Other Ambulatory Visit: Payer: Self-pay

## 2018-11-09 ENCOUNTER — Encounter (HOSPITAL_COMMUNITY): Payer: Self-pay | Admitting: Emergency Medicine

## 2018-11-09 DIAGNOSIS — R0602 Shortness of breath: Secondary | ICD-10-CM

## 2018-11-09 DIAGNOSIS — Z5320 Procedure and treatment not carried out because of patient's decision for unspecified reasons: Secondary | ICD-10-CM | POA: Diagnosis not present

## 2018-11-09 DIAGNOSIS — E872 Acidosis, unspecified: Secondary | ICD-10-CM

## 2018-11-09 DIAGNOSIS — Z03818 Encounter for observation for suspected exposure to other biological agents ruled out: Secondary | ICD-10-CM | POA: Insufficient documentation

## 2018-11-09 DIAGNOSIS — R079 Chest pain, unspecified: Secondary | ICD-10-CM

## 2018-11-09 DIAGNOSIS — I5042 Chronic combined systolic (congestive) and diastolic (congestive) heart failure: Secondary | ICD-10-CM | POA: Insufficient documentation

## 2018-11-09 DIAGNOSIS — Z20828 Contact with and (suspected) exposure to other viral communicable diseases: Secondary | ICD-10-CM | POA: Insufficient documentation

## 2018-11-09 DIAGNOSIS — F17210 Nicotine dependence, cigarettes, uncomplicated: Secondary | ICD-10-CM | POA: Diagnosis not present

## 2018-11-09 DIAGNOSIS — Z79899 Other long term (current) drug therapy: Secondary | ICD-10-CM | POA: Diagnosis not present

## 2018-11-09 DIAGNOSIS — I110 Hypertensive heart disease with heart failure: Secondary | ICD-10-CM | POA: Diagnosis not present

## 2018-11-09 DIAGNOSIS — N179 Acute kidney failure, unspecified: Secondary | ICD-10-CM

## 2018-11-09 DIAGNOSIS — I10 Essential (primary) hypertension: Secondary | ICD-10-CM

## 2018-11-09 LAB — CBC WITH DIFFERENTIAL/PLATELET
Abs Immature Granulocytes: 0.05 10*3/uL (ref 0.00–0.07)
Basophils Absolute: 0.1 10*3/uL (ref 0.0–0.1)
Basophils Relative: 1 %
Eosinophils Absolute: 0.2 10*3/uL (ref 0.0–0.5)
Eosinophils Relative: 3 %
HCT: 40.6 % (ref 36.0–46.0)
Hemoglobin: 13.6 g/dL (ref 12.0–15.0)
Immature Granulocytes: 1 %
Lymphocytes Relative: 37 %
Lymphs Abs: 2.3 10*3/uL (ref 0.7–4.0)
MCH: 28.3 pg (ref 26.0–34.0)
MCHC: 33.5 g/dL (ref 30.0–36.0)
MCV: 84.6 fL (ref 80.0–100.0)
Monocytes Absolute: 0.5 10*3/uL (ref 0.1–1.0)
Monocytes Relative: 8 %
Neutro Abs: 3.2 10*3/uL (ref 1.7–7.7)
Neutrophils Relative %: 50 %
Platelets: 250 10*3/uL (ref 150–400)
RBC: 4.8 MIL/uL (ref 3.87–5.11)
RDW: 15.3 % (ref 11.5–15.5)
WBC: 6.3 10*3/uL (ref 4.0–10.5)
nRBC: 0 % (ref 0.0–0.2)

## 2018-11-09 LAB — COMPREHENSIVE METABOLIC PANEL
ALT: 65 U/L — ABNORMAL HIGH (ref 0–44)
AST: 63 U/L — ABNORMAL HIGH (ref 15–41)
Albumin: 4 g/dL (ref 3.5–5.0)
Alkaline Phosphatase: 78 U/L (ref 38–126)
Anion gap: 19 — ABNORMAL HIGH (ref 5–15)
BUN: 15 mg/dL (ref 6–20)
CO2: 17 mmol/L — ABNORMAL LOW (ref 22–32)
Calcium: 9.2 mg/dL (ref 8.9–10.3)
Chloride: 102 mmol/L (ref 98–111)
Creatinine, Ser: 1.32 mg/dL — ABNORMAL HIGH (ref 0.44–1.00)
GFR calc Af Amer: 58 mL/min — ABNORMAL LOW (ref >60)
GFR calc non Af Amer: 50 mL/min — ABNORMAL LOW (ref >60)
Glucose, Bld: 74 mg/dL (ref 70–99)
Potassium: 3.5 mmol/L (ref 3.5–5.1)
Sodium: 138 mmol/L (ref 135–145)
Total Bilirubin: 0.4 mg/dL (ref 0.3–1.2)
Total Protein: 7.4 g/dL (ref 6.5–8.1)

## 2018-11-09 LAB — TROPONIN I
Troponin I: <0.03 ng/mL (ref <0.03)
Troponin I: <0.03 ng/mL (ref <0.03)

## 2018-11-09 LAB — BRAIN NATRIURETIC PEPTIDE: B Natriuretic Peptide: 30 pg/mL (ref 0.0–100.0)

## 2018-11-09 LAB — I-STAT BETA HCG BLOOD, ED (MC, WL, AP ONLY): I-stat hCG, quantitative: <5 m[IU]/mL (ref <5)

## 2018-11-09 LAB — SALICYLATE LEVEL: Salicylate Lvl: <7 mg/dL (ref 2.8–30.0)

## 2018-11-09 LAB — LACTIC ACID, PLASMA
Lactic Acid, Venous: 2.4 mmol/L (ref 0.5–1.9)
Lactic Acid, Venous: 1 mmol/L (ref 0.5–1.9)

## 2018-11-09 LAB — SARS CORONAVIRUS 2 BY RT PCR (HOSPITAL ORDER, PERFORMED IN ~~LOC~~ HOSPITAL LAB): SARS Coronavirus 2: NEGATIVE

## 2018-11-09 LAB — MAGNESIUM: Magnesium: 1.7 mg/dL (ref 1.7–2.4)

## 2018-11-09 MED ORDER — CYCLOBENZAPRINE HCL 10 MG PO TABS
10.0000 mg | ORAL_TABLET | Freq: Once | ORAL | Status: AC
  Administered 2018-11-09: 10 mg via ORAL
  Filled 2018-11-09: qty 1

## 2018-11-09 MED ORDER — HYDRALAZINE HCL 25 MG PO TABS: 25.0000 mg | ORAL_TABLET | Freq: Three times a day (TID) | ORAL | 1 refills | Status: DC

## 2018-11-09 MED ORDER — NITROGLYCERIN 0.4 MG SL SUBL
0.4000 mg | SUBLINGUAL_TABLET | SUBLINGUAL | Status: DC | PRN
  Administered 2018-11-09: 12:00:00 0.4 mg via SUBLINGUAL
  Filled 2018-11-09: qty 1

## 2018-11-09 MED ORDER — MORPHINE SULFATE (PF) 4 MG/ML IV SOLN
4.0000 mg | Freq: Once | INTRAVENOUS | Status: AC
  Administered 2018-11-09: 4 mg via INTRAVENOUS
  Filled 2018-11-09: qty 1

## 2018-11-09 MED ORDER — FUROSEMIDE 40 MG PO TABS: 40.0000 mg | ORAL_TABLET | Freq: Every day | ORAL | 1 refills | Status: DC

## 2018-11-09 MED ORDER — LABETALOL HCL 5 MG/ML IV SOLN
10.0000 mg | Freq: Once | INTRAVENOUS | Status: AC
  Administered 2018-11-09: 15:00:00 10 mg via INTRAVENOUS
  Filled 2018-11-09: qty 4

## 2018-11-09 MED ORDER — CYCLOBENZAPRINE HCL 10 MG PO TABS: 10.0000 mg | ORAL_TABLET | Freq: Two times a day (BID) | ORAL | 0 refills | Status: DC | PRN

## 2018-11-09 MED ORDER — CARVEDILOL 25 MG PO TABS: 25.0000 mg | ORAL_TABLET | Freq: Two times a day (BID) | ORAL | 1 refills | Status: DC

## 2018-11-09 MED ORDER — IOHEXOL 300 MG/ML  SOLN
100.0000 mL | Freq: Once | INTRAMUSCULAR | Status: AC | PRN
  Administered 2018-11-09: 12:00:00 100 mL via INTRAVENOUS

## 2018-11-09 MED ORDER — SODIUM CHLORIDE 0.9 % IV BOLUS
250.0000 mL | Freq: Once | INTRAVENOUS | Status: AC
  Administered 2018-11-09: 15:00:00 250 mL via INTRAVENOUS

## 2018-11-09 MED ORDER — ALBUTEROL SULFATE HFA 108 (90 BASE) MCG/ACT IN AERS
2.0000 | INHALATION_SPRAY | Freq: Once | RESPIRATORY_TRACT | Status: AC
  Administered 2018-11-09: 16:00:00 2 via RESPIRATORY_TRACT
  Filled 2018-11-09: qty 6.7

## 2018-11-09 NOTE — ED Provider Notes (Signed)
[facility] EMERGENCY DEPARTMENT Provider Note   CSN: [id number] Arrival date & time: 11/09/18  1012    History   Chief Complaint Chief Complaint  Patient presents with   Chest Pain    HPI [name] is a 43 y.o. female.     HPI  43 year old female with a history of CHF, hypertension, presents with concern for bilateral leg pain, chest pain and back pain, shortness of breath.  Report symptoms started a few days ago with pain in both legs that she describes as a cramping.  Reports there is severe and keep her awake at night.  Today, while she was at work, she developed severe chest and back pain.  Reports that it feels like knots bilaterally under her breasts, with some pain on the right side of her back that feels like cramping.  Reports she received aspirin and nitroglycerin in route and has had no improvement.  She does feel she is had similar symptoms in the past.  Reports in addition to the above symptoms, she has had shortness of breath and cough that are worse when laying down flat.  Denies fevers or known sick contacts.  Reports she has not taken her medications in about a month and a half, as she is not followed up with her physician and needs to soon.  Reports that she has significant shortness of breath and cough when she lays down flat.  Cough is nonproductive.  Denies abdominal pain, nausea, vomiting, diarrhea.  Past Medical History:  Diagnosis Date   Anemia    CHF (congestive heart failure) (HCC)    Common migraine with intractable migraine 01/25/2017   GERD (gastroesophageal reflux disease)    Hypertension    Miscarriage    x3   Sickle cell trait [facility])     Patient Active Problem List   Diagnosis Date Noted   Common migraine with intractable migraine 01/25/2017   Hypertension 12/27/2016   Chronic combined systolic and diastolic congestive heart failure (HCC) 11/09/2016   Chest pain 11/08/2016   Hypertensive urgency  11/08/2016   Admission for sterilization 08/17/2012   Spontaneous vaginal delivery 08/16/2012   Consultation for sterilization 07/25/2012   Poor fetal growth, affecting management of mother, antepartum condition or complication 07/11/2012   Chronic hypertension complicating or reason for care during pregnancy 04/30/2012   Supervision of high-risk pregnancy 04/30/2012   Sickle cell trait (HCC) 04/30/2012    Past Surgical History:  Procedure Laterality Date   BREAST SURGERY     cyst removal 1990   DILATION AND CURETTAGE OF UTERUS     four miscarriages   TUBAL LIGATION Bilateral 08/17/2012   Procedure: POST PARTUM TUBAL LIGATION;  Surgeon: Allie Bossier, MD;  Location: [facility];  Service: Gynecology;  Laterality: Bilateral;     OB History    Gravida  5   Para  2   Term  2   Preterm  0   AB  3   Living  2     SAB  3   TAB  0   Ectopic  0   Multiple  0   Live Births  2            Home Medications    Prior to Admission medications   Medication Sig Start Date End Date Taking? Authorizing Provider  albuterol (PROVENTIL HFA;VENTOLIN HFA) 108 (90 Base) MCG/ACT inhaler Inhale 2 puffs into the lungs every 4 (four) hours as needed for wheezing or shortness  of breath (cough, shortness of breath or wheezing.). 04/08/18   Kallie LocksStroud, Natalie M, FNP  buPROPion (WELLBUTRIN SR) 150 MG 12 hr tablet Take 1 tablet (150 mg total) by mouth 2 (two) times daily. 04/08/18 06/07/18  Kallie LocksStroud, Natalie M, FNP  carvedilol (COREG) 25 MG tablet Take 1 tablet (25 mg total) by mouth 2 (two) times daily with a meal. 04/08/18 06/07/18  Kallie LocksStroud, Natalie M, FNP  cholecalciferol (VITAMIN D) 1000 units tablet Take 1 tablet (1,000 Units total) by mouth daily. 04/16/18   Kallie LocksStroud, Natalie M, FNP  furosemide (LASIX) 40 MG tablet Take 1 tablet (40 mg total) by mouth daily. 04/08/18 06/07/18  Kallie LocksStroud, Natalie M, FNP  gabapentin (NEURONTIN) 300 MG capsule Take 1 capsule (300 mg total) by mouth 3 (three) times daily.  04/08/18 06/07/18  Kallie LocksStroud, Natalie M, FNP  hydrALAZINE (APRESOLINE) 25 MG tablet Take 1 tablet (25 mg total) by mouth 3 (three) times daily. 04/08/18 06/07/18  Kallie LocksStroud, Natalie M, FNP  RESTASIS 0.05 % ophthalmic emulsion Apply to eye as directed. 02/28/18   [provider]  sacubitril-valsartan (ENTRESTO) 97-103 MG Take 1 tablet by mouth 2 (two) times daily. 01/15/18   Wendall StadeNishan, Peter C, MD  spironolactone (ALDACTONE) 25 MG tablet Take 1 tablet (25 mg total) by mouth daily. 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   Review of Systems Review of Systems  Constitutional: Positive for fatigue. Negative for fever.  HENT: Negative for sore throat.   Eyes: Negative for visual disturbance.  Respiratory: Positive for cough and shortness of breath.   Cardiovascular: Positive for chest pain and leg swelling (yesterday seems less today).  Gastrointestinal: Negative for abdominal pain, diarrhea, nausea and vomiting.  Genitourinary: Negative for difficulty urinating.  Musculoskeletal: Positive for arthralgias, back pain and myalgias. Negative for neck pain.  Skin: Negative for rash.  Neurological: Negative for syncope and headaches.     Physical Exam Updated Vital Signs BP (!) 177/112    Pulse 92    Temp 98.3 F (36.8 C) (Oral)    Resp (!) 25    Ht 5\' 4"  (1.626 m)    Wt 52.2 kg    LMP 11/06/2018    SpO2 99%    BMI 19.74 kg/m   Physical  Exam Vitals signs and nursing note reviewed.  Constitutional:      General: She is in acute distress (appears in pain).     Appearance: She is well-developed. She is not diaphoretic.  HENT:     Head: Normocephalic and atraumatic.  Eyes:     Conjunctiva/sclera: Conjunctivae normal.  Neck:     Musculoskeletal: Normal range of motion.     Vascular: JVD present.  Cardiovascular:     Rate and Rhythm: Normal rate and regular rhythm.     Heart sounds: Normal heart sounds.  No murmur. No friction rub. No gallop.   Pulmonary:     Effort: Pulmonary effort is normal. Tachypnea present. No respiratory distress.     Breath sounds: Normal breath sounds. No wheezing or rales.  Abdominal:     General: There is no distension.     Palpations: Abdomen is soft.     Tenderness: There is no abdominal tenderness. There is no guarding.  Musculoskeletal:        General: No tenderness.  Skin:    General: Skin is warm and dry.     Findings: No erythema or rash.  Neurological:     Mental Status: She is alert and oriented to person, place, and time.      ED Treatments / Results  Labs (all labs ordered are listed, but only abnormal results are displayed) Labs Reviewed  SARS CORONAVIRUS 2 (HOSPITAL ORDER, PERFORMED IN [facility])  CBC WITH DIFFERENTIAL/PLATELET  COMPREHENSIVE METABOLIC PANEL  TROPONIN I  BRAIN NATRIURETIC PEPTIDE  MAGNESIUM  I-STAT BETA HCG BLOOD, ED ([facility], [facility], [facility] ONLY)    EKG EKG Interpretation  Date/Time:  Saturday Nov 09 2018 10:22:26 EDT Ventricular Rate:  89 PR Interval:    QRS Duration: 96 QT Interval:  406 QTC Calculation: 494 R Axis:   -14 Text Interpretation:  Age not entered, assumed to be  43 years old for purpose of ECG interpretation Sinus rhythm Probable left atrial enlargement Borderline prolonged QT interval No significant change since last tracing Confirmed by Alvira Monday ([id number]) on 11/09/2018 10:49:25 AM   Radiology No results  found.  Procedures Procedures (including critical care time)  Medications Ordered in ED Medications  cyclobenzaprine (FLEXERIL) tablet 10 mg (has no administration in time range)  morphine 4 MG/ML injection 4 mg (has no administration in time range)     Initial Impression / Assessment and Plan / ED Course  I have reviewed the triage vital signs and the nursing notes.  Pertinent labs & imaging results that were available during my care of the patient were reviewed by me and considered in my medical decision making (see chart for details).        43 year old female with a history of CHF Ef 35-40% by last ECHO 2018, hypertension, presents with concern for bilateral leg pain, chest pain and back pain, shortness of breath. Presents with severe pain, htn  DDx includes aortic dissection, ACS, CHF exacerbation, pneumonia, COVID19, PE, electrolyte abnormality.  Labs show mild AKI, metabolic acidosis. Lactate positive, salicylate negative. Consider etoh ketoacidosis given hx of alcohol use.   CTA dissection study negative for any acute abnormalities. Suspect muscular spasm, possibly secondary to acidosis.    Delta troponins negative, doubt ACS.  No sign of volume overload on CT and BNP normal.  COVID test negative.   Discussed AKI and metabolic acidosis with patient, would like to admit her given hypertension, AKI, metabolic acidosis for continued monitoring, BP control, however patient would like to leave against medical advice. I discussed the risks if her lab abnormalities worse such as death and disability.    Wrote rx for some htn medications.  She has close follow up with PCP. Discussed reasons to return.    Final Clinical Impressions(s) / ED Diagnoses   Final diagnoses:  Chest pain, unspecified type  Shortness of breath    ED Discharge Orders    None       Alvira Monday, MD 11/09/18 1529

## 2018-11-09 NOTE — ED Notes (Addendum)
Pt returned from CT °

## 2018-11-09 NOTE — ED Notes (Signed)
Patient verbalizes understanding of discharge instructions. Opportunity for questioning and answers were provided. Armband removed by staff, pt discharged from ED.  

## 2018-11-09 NOTE — ED Triage Notes (Addendum)
Per EMS- pt woke up with leg pain bilaterally, pt also has chest pain that feels like a knot in both sides of the chest. Pt has been out of home meds for 1 month. Hx of CHF and HTN. Had 1 nitro with no relief, 324 asprin. C.o. headache. Pt reports she has had a cough and shortness of breath at night for a while now.

## 2018-11-09 NOTE — ED Notes (Signed)
Patient transported to CT 

## 2018-11-09 NOTE — ED Notes (Signed)
Got patient undress on the monitor did ekg shown to er doctor patient is resting with call bell in reach 

## 2018-11-09 NOTE — ED Notes (Signed)
Critical value: lactic acid 2.4; Notified MD.

## 2018-11-13 ENCOUNTER — Ambulatory Visit (INDEPENDENT_AMBULATORY_CARE_PROVIDER_SITE_OTHER): Payer: Medicaid Other | Admitting: Family Medicine

## 2018-11-13 ENCOUNTER — Encounter: Payer: Self-pay | Admitting: Family Medicine

## 2018-11-13 ENCOUNTER — Other Ambulatory Visit: Payer: Self-pay

## 2018-11-13 VITALS — BP 140/86 | HR 98 | Temp 98.0°F | Ht 64.0 in | Wt 118.8 lb

## 2018-11-13 DIAGNOSIS — I10 Essential (primary) hypertension: Secondary | ICD-10-CM | POA: Diagnosis not present

## 2018-11-13 DIAGNOSIS — N390 Urinary tract infection, site not specified: Secondary | ICD-10-CM

## 2018-11-13 DIAGNOSIS — I5022 Chronic systolic (congestive) heart failure: Secondary | ICD-10-CM | POA: Diagnosis not present

## 2018-11-13 DIAGNOSIS — Z09 Encounter for follow-up examination after completed treatment for conditions other than malignant neoplasm: Secondary | ICD-10-CM

## 2018-11-13 DIAGNOSIS — Z0000 Encounter for general adult medical examination without abnormal findings: Secondary | ICD-10-CM

## 2018-11-13 DIAGNOSIS — Z131 Encounter for screening for diabetes mellitus: Secondary | ICD-10-CM | POA: Diagnosis not present

## 2018-11-13 DIAGNOSIS — S66911A Strain of unspecified muscle, fascia and tendon at wrist and hand level, right hand, initial encounter: Secondary | ICD-10-CM

## 2018-11-13 DIAGNOSIS — R0789 Other chest pain: Secondary | ICD-10-CM

## 2018-11-13 LAB — POCT URINALYSIS DIP (MANUAL ENTRY)
Bilirubin, UA: NEGATIVE
Blood, UA: NEGATIVE
Glucose, UA: NEGATIVE mg/dL
Ketones, POC UA: NEGATIVE mg/dL
Nitrite, UA: NEGATIVE
Protein Ur, POC: NEGATIVE mg/dL
Spec Grav, UA: 1.015 (ref 1.010–1.025)
Urobilinogen, UA: 0.2 E.U./dL
pH, UA: 6 (ref 5.0–8.0)

## 2018-11-13 LAB — POCT GLYCOSYLATED HEMOGLOBIN (HGB A1C): Hemoglobin A1C: 4.9 % (ref 4.0–5.6)

## 2018-11-13 MED ORDER — SPIRONOLACTONE 25 MG PO TABS: 25.0000 mg | ORAL_TABLET | Freq: Every day | ORAL | 1 refills | Status: DC

## 2018-11-13 MED ORDER — RESTASIS 0.05 % OP EMUL: 1.0000 [drp] | OPHTHALMIC | 3 refills | Status: AC

## 2018-11-13 MED ORDER — SACUBITRIL-VALSARTAN 97-103 MG PO TABS: 1.0000 | ORAL_TABLET | Freq: Two times a day (BID) | ORAL | 2 refills | Status: DC

## 2018-11-13 MED ORDER — GABAPENTIN 300 MG PO CAPS: 300.0000 mg | ORAL_CAPSULE | Freq: Three times a day (TID) | ORAL | 1 refills | Status: DC

## 2018-11-13 MED ORDER — IBUPROFEN 800 MG PO TABS: 800.0000 mg | ORAL_TABLET | Freq: Three times a day (TID) | ORAL | 2 refills | Status: DC | PRN

## 2018-11-13 MED ORDER — ALBUTEROL SULFATE HFA 108 (90 BASE) MCG/ACT IN AERS: 2.0000 | INHALATION_SPRAY | RESPIRATORY_TRACT | 12 refills | Status: DC | PRN

## 2018-11-13 MED ORDER — CYCLOBENZAPRINE HCL 10 MG PO TABS: 10.0000 mg | ORAL_TABLET | Freq: Two times a day (BID) | ORAL | 0 refills | Status: DC | PRN

## 2018-11-13 MED ORDER — HYDRALAZINE HCL 25 MG PO TABS: 25.0000 mg | ORAL_TABLET | Freq: Three times a day (TID) | ORAL | 1 refills | Status: DC

## 2018-11-13 MED ORDER — FUROSEMIDE 40 MG PO TABS: 40.0000 mg | ORAL_TABLET | Freq: Every day | ORAL | 1 refills | Status: DC

## 2018-11-13 MED ORDER — BUPROPION HCL ER (SR) 150 MG PO TB12: 150.0000 mg | ORAL_TABLET | Freq: Two times a day (BID) | ORAL | 1 refills | Status: DC

## 2018-11-13 MED ORDER — CARVEDILOL 25 MG PO TABS: 25.0000 mg | ORAL_TABLET | Freq: Two times a day (BID) | ORAL | 1 refills | Status: DC

## 2018-11-13 MED ORDER — SULFAMETHOXAZOLE-TRIMETHOPRIM 800-160 MG PO TABS: 1.0000 | ORAL_TABLET | Freq: Two times a day (BID) | ORAL | 0 refills | Status: DC

## 2018-11-13 NOTE — Progress Notes (Signed)
[facility]   Hospital Follow Up Subjective:  Patient ID: [name], female    DOB: [birth year]  Age: 43 y.o. MRN: [id number]  CC:  Chief Complaint  Patient presents with  . Hospitalization Follow-up    HPI [name] is a 43 year old female who  presents for Follow Up today.   Past Medical History:  Diagnosis Date  . Anemia   . CHF (congestive heart failure) (HCC)   . Common migraine with intractable migraine 01/25/2017  . GERD (gastroesophageal reflux disease)   . Hypertension   . Miscarriage    x3  . Sickle cell trait (HCC)    Current Status: Since her last office visit, she has had a ED visit for Chest Pain and Shortness of Breath on 11/09/2018. Today, she is doing well with no complaints of chest discomfort. She states that she continues to have intermittent episodes of cough and chest pain, which she has to sit up in bed for relief. She has previously been followed by Dr. Eden Emms, Cardiologist. She has not been taking any of her medications for months now, and she has been lost to follow up with Dr. Eden Emms and primary care follow ups. She continues to smoke 10 cigarettes daily. She reports occasional cough. She denies visual changes, chest pain, cough, shortness of breath, heart palpitations, and falls. She has occasional headaches and dizziness with position changes. Denies severe headaches, confusion, seizures, double vision, and blurred vision, nausea and vomiting.  She denies fevers, chills, fatigue, recent infections, weight loss, and night sweats. No reports of GI problems such as diarrhea, and constipation. She has no reports of blood in stools, dysuria and hematuria. No depression or anxiety reported. She denies pain today.   Past Surgical History:  Procedure Laterality Date  . BREAST SURGERY     cyst removal 1990  . DILATION AND CURETTAGE OF UTERUS     four miscarriages  . TUBAL LIGATION Bilateral 08/17/2012   Procedure: POST PARTUM TUBAL LIGATION;  Surgeon: Allie Bossier, MD;  Location: [facility];  Service: Gynecology;  Laterality: Bilateral;    Family History  Problem Relation Age of Onset  . Hypertension Mother   . Hypertension Maternal Grandmother   . Hypertension Maternal Grandfather   . Other Neg Hx     Social History   Socioeconomic History  . Marital status: Single    Spouse name: Not on file  . Number of children: 2  . Years of education: 75  . Highest education level: Not on file  Occupational History  . Occupation: Unemployed  Social Needs  . Financial resource strain: Not on file  . Food insecurity:    Worry: Not on file    Inability: Not on file  . Transportation needs:    Medical: Not on file    Non-medical: Not on file  Tobacco Use  . Smoking status: Current Every Day Smoker    Packs/day: 0.30    Years: 10.00    Pack years: 3.00    Types: Cigarettes  . Smokeless tobacco: Never Used  Substance and Sexual Activity  . 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 Drug use: No  . Sexual activity: Yes    Birth control/protection: None  Lifestyle  . Physical activity:    Days per week: Not on file    Minutes per session: Not on file  . Stress:  Not on file  Relationships  . Social connections:    Talks on phone: Not on file    Gets together: Not on file    Attends religious service: Not on file    Active member of club or organization: Not on file    Attends meetings of clubs or organizations: Not on file    Relationship status: Not on file  . Intimate partner violence:    Fear of current or ex partner: Not on file    Emotionally abused: Not on file    Physically abused: Not on file    Forced sexual activity: Not on file  Other Topics Concern  . Not on file  Social History Narrative   Lives with mother, Elease Hashimotoatricia   Caffeine use: Tea daily   Left handed    Outpatient Medications Prior to Visit  Medication Sig Dispense Refill   . albuterol (PROVENTIL HFA;VENTOLIN HFA) 108 (90 Base) MCG/ACT inhaler Inhale 2 puffs into the lungs every 4 (four) hours as needed for wheezing or shortness of breath (cough, shortness of breath or wheezing.). 1 Inhaler 12  . cholecalciferol (VITAMIN D) 1000 units tablet Take 1 tablet (1,000 Units total) by mouth daily. (Patient not taking: Reported on 11/13/2018) 30 tablet 4  . buPROPion (WELLBUTRIN SR) 150 MG 12 hr tablet Take 1 tablet (150 mg total) by mouth 2 (two) times daily. 60 tablet 1  . carvedilol (COREG) 25 MG tablet Take 1 tablet (25 mg total) by mouth 2 (two) times daily with a meal. (Patient not taking: Reported on 11/13/2018) 60 tablet 1  . cyclobenzaprine (FLEXERIL) 10 MG tablet Take 1 tablet (10 mg total) by mouth 2 (two) times daily as needed for muscle spasms. (Patient not taking: Reported on 11/13/2018) 20 tablet 0  . furosemide (LASIX) 40 MG tablet Take 1 tablet (40 mg total) by mouth daily. (Patient not taking: Reported on 11/13/2018) 30 tablet 1  . gabapentin (NEURONTIN) 300 MG capsule Take 1 capsule (300 mg total) by mouth 3 (three) times daily. 90 capsule 1  . hydrALAZINE (APRESOLINE) 25 MG tablet Take 1 tablet (25 mg total) by mouth 3 (three) times daily. (Patient not taking: Reported on 11/13/2018) 90 tablet 1  . RESTASIS 0.05 % ophthalmic emulsion Apply to eye as directed.  3  . sacubitril-valsartan (ENTRESTO) 97-103 MG Take 1 tablet by mouth 2 (two) times daily. (Patient not taking: Reported on 11/13/2018) 180 tablet 2  . spironolactone (ALDACTONE) 25 MG tablet Take 1 tablet (25 mg total) by mouth daily. 30 tablet 1   No facility-administered medications prior to visit.     No Known Allergies  ROS Review of Systems  Constitutional: Negative.   HENT: Negative.   Eyes: Negative.   Respiratory: Positive for shortness of breath (occasionally).   Cardiovascular: Positive for chest pain (occasionally).  Gastrointestinal: Negative.   Endocrine: Negative.   Genitourinary:  Negative.   Musculoskeletal: Negative.   Skin: Negative.   Allergic/Immunologic: Negative.   Neurological: Positive for dizziness and headaches.  Hematological: Negative.   Psychiatric/Behavioral: Negative.       Objective:    Physical Exam  Constitutional: She is oriented to person, place, and time. She appears well-developed and well-nourished.  HENT:  Head: Normocephalic and atraumatic.  Eyes: Conjunctivae are normal.  Neck: Normal range of motion. Neck supple.  Cardiovascular: Normal rate, regular rhythm, normal heart sounds and intact distal pulses.  Pulmonary/Chest: Effort normal and breath sounds normal.  Abdominal: Soft. Bowel sounds are normal.  Musculoskeletal: Normal range of motion.  Neurological: She is alert and oriented to person, place, and time. She has normal reflexes.  Skin: Skin is warm and dry.  Psychiatric: She has a normal mood and affect. Her behavior is normal. Judgment and thought content normal.  Nursing note and vitals reviewed.   BP 140/86 (BP Location: Left Arm, Patient Position: Sitting, Cuff Size: Small)   Pulse 98   Temp 98 F (36.7 C) (Oral)   Ht  (1.626 m)   Wt 118 lb 12.8 oz (53.9 kg)   LMP 11/06/2018   SpO2 100%   BMI 20.39 kg/m  Wt Readings from Last 3 Encounters:  11/13/18 118 lb 12.8 oz (53.9 kg)  11/09/18 115 lb (52.2 kg)  04/08/18 118 lb (53.5 kg)     Health Maintenance Due  Topic Date Due  . PAP SMEAR-Modifier  01/06/1999    There are no preventive care reminders to display for this patient.  Lab Results  Component Value Date   TSH 2.890 11/13/2018   Lab Results  Component Value Date   WBC 6.5 11/13/2018   HGB 14.4 11/13/2018   HCT 42.9 11/13/2018   MCV 87 11/13/2018   PLT 297 11/13/2018   Lab Results  Component Value Date   NA 139 11/13/2018   K 4.1 11/13/2018   CO2 22 11/13/2018   GLUCOSE 88 11/13/2018   BUN 18 11/13/2018   CREATININE 0.86 11/13/2018   BILITOT 0.3 11/13/2018   ALKPHOS 81  11/13/2018   AST 31 11/13/2018   ALT 43 (H) 11/13/2018   PROT 7.8 11/13/2018   ALBUMIN 4.8 11/13/2018   CALCIUM 9.6 11/13/2018   ANIONGAP 19 (H) 11/09/2018   Lab Results  Component Value Date   CHOL 247 (H) 11/13/2018   Lab Results  Component Value Date   HDL 91 11/13/2018   Lab Results  Component Value Date   LDLCALC 108 (H) 11/13/2018   Lab Results  Component Value Date   TRIG 239 (H) 11/13/2018   Lab Results  Component Value Date   CHOLHDL 2.7 11/13/2018   Lab Results  Component Value Date   HGBA1C 4.9 11/13/2018      Assessment & Plan:   1. Hospital discharge follow-up  2. Chest discomfort Stable today.   3. Chronic systolic heart failure (HCC) Stable today. Occasional chest discomfort. She will follow up with Dr. Eden Emms, Cardiologist as needed.  - CBC with Differential - Comprehensive metabolic panel - TSH - Lipid Panel - Vitamin D, 25-hydroxy - Vitamin B12  4. Essential hypertension The current medical regimen is effective; blood pressure is stable at 140/86 today; continue present plan and medications. She will continue to decrease high sodium intake, excessive alcohol intake, increase potassium intake, smoking cessation, and increase physical activity of at least 30 minutes of cardio activity daily. She will continue to follow Heart Healthy or DASH diet. - CBC with Differential - Comprehensive metabolic panel - TSH - Lipid Panel - Vitamin D, 25-hydroxy - Vitamin B12  5. Wrist strain, right, initial encounter We will initiate Motrin today. RICE treatment reviewed with patient today. She is also encouraged to wear wrist brace for added support of wrist.  - ibuprofen (ADVIL) 800 MG tablet; Take 1 tablet (800 mg total) by mouth every 8 (eight) hours as needed.  Dispense: 30 tablet; Refill: 2  6. Screening for diabetes mellitus Hgb A1c is within normal range of 4.9 today.  She will continue to decrease foods/beverages high in sugars  and carbs and  follow Heart Healthy or DASH diet. Increase physical activity to at least 30 minutes cardio exercise daily.  - POCT glycosylated hemoglobin (Hb A1C) - POCT urinalysis dipstick  7. Urinary tract infection without hematuria, site unspecified We will initiate Septra today.  - sulfamethoxazole-trimethoprim (BACTRIM DS) 800-160 MG tablet; Take 1 tablet by mouth 2 (two) times daily.  Dispense: 14 tablet; Refill: 0  8. Healthcare maintenance - CBC with Differential - Comprehensive metabolic panel - TSH - Lipid Panel - Vitamin D, 25-hydroxy - Vitamin B12  9. Follow up She will follow up in 1 month.   Meds ordered this encounter  Medications  . sulfamethoxazole-trimethoprim (BACTRIM DS) 800-160 MG tablet    Sig: Take 1 tablet by mouth 2 (two) times daily.    Dispense:  14 tablet    Refill:  0  . albuterol (VENTOLIN HFA) 108 (90 Base) MCG/ACT inhaler    Sig: Inhale 2 puffs into the lungs every 4 (four) hours as needed for wheezing or shortness of breath (cough, shortness of breath or wheezing.).    Dispense:  1 Inhaler    Refill:  12  . buPROPion (WELLBUTRIN SR) 150 MG 12 hr tablet    Sig: Take 1 tablet (150 mg total) by mouth 2 (two) times daily.    Dispense:  60 tablet    Refill:  1  . carvedilol (COREG) 25 MG tablet    Sig: Take 1 tablet (25 mg total) by mouth 2 (two) times daily with a meal.    Dispense:  60 tablet    Refill:  1  . cyclobenzaprine (FLEXERIL) 10 MG tablet    Sig: Take 1 tablet (10 mg total) by mouth 2 (two) times daily as needed for muscle spasms.    Dispense:  20 tablet    Refill:  0  . furosemide (LASIX) 40 MG tablet    Sig: Take 1 tablet (40 mg total) by mouth daily.    Dispense:  30 tablet    Refill:  1  . gabapentin (NEURONTIN) 300 MG capsule    Sig: Take 1 capsule (300 mg total) by mouth 3 (three) times daily.    Dispense:  90 capsule    Refill:  1  . hydrALAZINE (APRESOLINE) 25 MG tablet    Sig: Take 1 tablet (25 mg total) by mouth 3 (three) times  daily.    Dispense:  90 tablet    Refill:  1  . RESTASIS 0.05 % ophthalmic emulsion    Sig: Place 1 drop into both eyes as directed.    Dispense:  0.4 mL    Refill:  3  . sacubitril-valsartan (ENTRESTO) 97-103 MG    Sig: Take 1 tablet by mouth 2 (two) times daily.    Dispense:  180 tablet    Refill:  2  . spironolactone (ALDACTONE) 25 MG tablet    Sig: Take 1 tablet (25 mg total) by mouth daily.    Dispense:  30 tablet    Refill:  1  . ibuprofen (ADVIL) 800 MG tablet    Sig: Take 1 tablet (800 mg total) by mouth every 8 (eight) hours as needed.    Dispense:  30 tablet    Refill:  2  . nitroGLYCERIN (NITROSTAT) 0.3 MG SL tablet    Sig: Place 1 tablet (0.3 mg total) under the tongue every 5 (five) minutes as needed for chest pain.    Dispense:  90 tablet    Refill:  3  Orders Placed This Encounter  Procedures  . CBC with Differential  . Comprehensive metabolic panel  . TSH  . Lipid Panel  . Vitamin D, 25-hydroxy  . Vitamin B12  . POCT glycosylated hemoglobin (Hb A1C)  . POCT urinalysis dipstick   Referral Orders  No referral(s) requested today    Raliegh Ip,  MSN, FNP-C [facility] [address] [place], [place] [address] [phone]   Problem List Items Addressed This Visit      Cardiovascular and Mediastinum   Hypertension   Relevant Medications   carvedilol (COREG) 25 MG tablet   furosemide (LASIX) 40 MG tablet   hydrALAZINE (APRESOLINE) 25 MG tablet   sacubitril-valsartan (ENTRESTO) 97-103 MG   spironolactone (ALDACTONE) 25 MG tablet   nitroGLYCERIN (NITROSTAT) 0.3 MG SL tablet   Other Relevant Orders   CBC with Differential (Completed)   Comprehensive metabolic panel (Completed)   TSH (Completed)   Lipid Panel (Completed)   Vitamin D, 25-hydroxy (Completed)   Vitamin B12 (Completed)    Other Visit Diagnoses    Hospital discharge follow-up    -  Primary   Chest discomfort       Relevant Medications    nitroGLYCERIN (NITROSTAT) 0.3 MG SL tablet   Chronic systolic heart failure (HCC)       Relevant Medications   carvedilol (COREG) 25 MG tablet   furosemide (LASIX) 40 MG tablet   hydrALAZINE (APRESOLINE) 25 MG tablet   sacubitril-valsartan (ENTRESTO) 97-103 MG   spironolactone (ALDACTONE) 25 MG tablet   nitroGLYCERIN (NITROSTAT) 0.3 MG SL tablet   Other Relevant Orders   CBC with Differential (Completed)   Comprehensive metabolic panel (Completed)   TSH (Completed)   Lipid Panel (Completed)   Vitamin D, 25-hydroxy (Completed)   Vitamin B12 (Completed)   Wrist strain, right, initial encounter       Relevant Medications   ibuprofen (ADVIL) 800 MG tablet   Screening for diabetes mellitus       Relevant Orders   POCT glycosylated hemoglobin (Hb A1C) (Completed)   POCT urinalysis dipstick (Completed)   Urinary tract infection without hematuria, site unspecified       Relevant Medications   sulfamethoxazole-trimethoprim (BACTRIM DS) 800-160 MG tablet   Healthcare maintenance       Relevant Orders   CBC with Differential (Completed)   Comprehensive metabolic panel (Completed)   TSH (Completed)   Lipid Panel (Completed)   Vitamin D, 25-hydroxy (Completed)   Vitamin B12 (Completed)   Follow up          Meds ordered this encounter  Medications  . sulfamethoxazole-trimethoprim (BACTRIM DS) 800-160 MG tablet    Sig: Take 1 tablet by mouth 2 (two) times daily.    Dispense:  14 tablet    Refill:  0  . albuterol (VENTOLIN HFA) 108 (90 Base) MCG/ACT inhaler    Sig: Inhale 2 puffs into the lungs every 4 (four) hours as needed for wheezing or shortness of breath (cough, shortness of breath or wheezing.).    Dispense:  1 Inhaler    Refill:  12  . buPROPion (WELLBUTRIN SR) 150 MG 12 hr tablet    Sig: Take 1 tablet (150 mg total) by mouth 2 (two) times daily.    Dispense:  60 tablet    Refill:  1  . carvedilol (COREG) 25 MG tablet    Sig: Take 1 tablet (25 mg total) by mouth 2 (two)  times daily with a  meal.    Dispense:  60 tablet    Refill:  1  . cyclobenzaprine (FLEXERIL) 10 MG tablet    Sig: Take 1 tablet (10 mg total) by mouth 2 (two) times daily as needed for muscle spasms.    Dispense:  20 tablet    Refill:  0  . furosemide (LASIX) 40 MG tablet    Sig: Take 1 tablet (40 mg total) by mouth daily.    Dispense:  30 tablet    Refill:  1  . gabapentin (NEURONTIN) 300 MG capsule    Sig: Take 1 capsule (300 mg total) by mouth 3 (three) times daily.    Dispense:  90 capsule    Refill:  1  . hydrALAZINE (APRESOLINE) 25 MG tablet    Sig: Take 1 tablet (25 mg total) by mouth 3 (three) times daily.    Dispense:  90 tablet    Refill:  1  . RESTASIS 0.05 % ophthalmic emulsion    Sig: Place 1 drop into both eyes as directed.    Dispense:  0.4 mL    Refill:  3  . sacubitril-valsartan (ENTRESTO) 97-103 MG    Sig: Take 1 tablet by mouth 2 (two) times daily.    Dispense:  180 tablet    Refill:  2  . spironolactone (ALDACTONE) 25 MG tablet    Sig: Take 1 tablet (25 mg total) by mouth daily.    Dispense:  30 tablet    Refill:  1  . ibuprofen (ADVIL) 800 MG tablet    Sig: Take 1 tablet (800 mg total) by mouth every 8 (eight) hours as needed.    Dispense:  30 tablet    Refill:  2  . nitroGLYCERIN (NITROSTAT) 0.3 MG SL tablet    Sig: Place 1 tablet (0.3 mg total) under the tongue every 5 (five) minutes as needed for chest pain.    Dispense:  90 tablet    Refill:  3    Follow-up: Return in about 1 month (around 12/14/2018).    Kallie Locks, FNP

## 2018-11-13 NOTE — Patient Instructions (Addendum)
Heart Failure  Heart failure means your heart has trouble pumping blood. This makes it hard for your body to work well. Heart failure is usually a long-term (chronic) condition. You must take good care of yourself and follow your treatment plan from your doctor. Follow these instructions at home: Medicines  Take over-the-counter and prescription medicines only as told by your doctor. ? Do not stop taking your medicine unless your doctor told you to do that. ? Do not skip any doses. ? Refill your prescriptions before you run out of medicine. You need your medicines every day. Eating and drinking   Eat heart-healthy foods. Talk with a diet and nutrition specialist (dietitian) to make an eating plan.  Choose foods that: ? Have no trans fat. ? Are low in saturated fat and cholesterol.  Choose healthy foods, like: ? Fresh or frozen fruits and vegetables. ? Fish. ? Low-fat (lean) meats. ? Legumes (like beans, peas, and lentils). ? Fat-free or low-fat dairy products. ? Whole-grain foods. ? High-fiber foods.  Limit salt (sodium) if told by your doctor. Ask your nutrition specialist to recommend heart-healthy seasonings.  Cook in healthy ways instead of frying. Healthy ways of cooking include: ? Roasting. ? Grilling. ? Broiling. ? Baking. ? Poaching. ? Steaming. ? Stir-frying.  Limit how much fluid you drink, if told by your doctor. Lifestyle  Do not smoke or use chewing tobacco. Do not use nicotine gum or patches before talking to your doctor.  Limit alcohol intake to no more than 1 drink a day for non-pregnant women and 2 drinks a day for men. One drink equals 12 oz of beer, 5 oz of wine, or 1 oz of hard liquor. ? Tell your doctor if you drink alcohol many times a week. ? Talk with your doctor about whether any alcohol is safe for you. ? You should stop drinking alcohol: ? If your heart has been damaged by alcohol. ? You have very bad heart failure.  Do not use illegal  drugs.  Lose weight if told by your doctor.  Do moderate physical activity if told by your doctor. Ask your doctor what activities are safe for you if: ? You are of older age (elderly). ? You have very bad heart failure. Keep track of important information  Weigh yourself every day. ? Weigh yourself every morning after you pee (urinate) and before breakfast. ? Wear the same amount of clothing each time. ? Write down your daily weight. Give your record to your doctor.  Check and write down your blood pressure as told by your doctor.  Check your pulse as told by your doctor. Dealing with heat and cold  If the weather is very hot: ? Avoid activity that takes a lot of energy. ? Use air conditioning or fans, or find a cooler place. ? Avoid caffeine. ? Avoid alcohol. ? Wear clothing that is loose-fitting, lightweight, and light-colored.  If the weather is very cold: ? Avoid activity that takes a lot of energy. ? Layer your clothes. ? Wear mittens or gloves, a hat, and a scarf when you go outside. ? Avoid alcohol. General instructions  Manage other conditions that you have as told by your doctor.  Learn to manage stress. If you need help, ask your doctor.  Plan rest periods for when you get tired.  Get education and support as needed.  Get rehab (rehabilitation) to help you stay independent and to help with everyday tasks.  Stay up to date with shots (  immunizations), especially pneumococcal and flu (influenza) shots.  Keep all follow-up visits as told by your doctor. This is important. Contact a doctor if:  You gain weight quickly.  You are more short of breath than normal.  You cannot do your normal activities.  You tire easily.  You cough more than normal, especially with activity.  You have any or more puffiness (swelling) in areas such as your hands, feet, ankles, or belly (abdomen).  You cannot sleep because it is hard to breathe.  You feel like your heart  is beating fast (palpitations).  You get dizzy or light-headed when you stand up. Get help right away if:  You have trouble breathing.  You or someone else notices a change in your awareness. This could be trouble staying awake or trouble concentrating.  You have chest pain or discomfort.  You pass out (faint). Summary  Heart failure means your heart has trouble pumping blood.  Make sure you refill your prescriptions before you run out of medicine. You need your medicines every day.  Keep records of your weight and blood pressure to give to your doctor.  Contact a doctor if you gain weight quickly. This information is not intended to replace advice given to you by your health care provider. Make sure you discuss any questions you have with your health care provider. Document Released: 03/28/2008 Document Revised: 03/13/2018 Document Reviewed: 07/11/2016 Elsevier Interactive Patient Education  2019 [employer] Hypertension Hypertension, commonly called high blood pressure, is when the force of blood pumping through the arteries is too strong. The arteries are the blood vessels that carry blood from the heart throughout the body. Hypertension forces the heart to work harder to pump blood and may cause arteries to become narrow or stiff. Having untreated or uncontrolled hypertension can cause heart attacks, strokes, kidney disease, and other problems. A blood pressure reading consists of a higher number over a lower number. Ideally, your blood pressure should be below 120/80. The first ("top") number is called the systolic pressure. It is a measure of the pressure in your arteries as your heart beats. The second ("bottom") number is called the diastolic pressure. It is a measure of the pressure in your arteries as the heart relaxes. What are the causes? The cause of this condition is not known. What increases the risk? Some risk factors for high blood pressure are under your control.  Others are not. Factors you can change  Smoking.  Having type 2 diabetes mellitus, high cholesterol, or both.  Not getting enough exercise or physical activity.  Being overweight.  Having too much fat, sugar, calories, or salt (sodium) in your diet.  Drinking too much alcohol. Factors that are difficult or impossible to change  Having chronic kidney disease.  Having a family history of high blood pressure.  Age. Risk increases with age.  Race. You may be at higher risk if you are African-American.  Gender. Men are at higher risk than women before age 41. After age 41, women are at higher risk than men.  Having obstructive sleep apnea.  Stress. What are the signs or symptoms? Extremely high blood pressure (hypertensive crisis) may cause:  Headache.  Anxiety.  Shortness of breath.  Nosebleed.  Nausea and vomiting.  Severe chest pain.  Jerky movements you cannot control (seizures). How is this diagnosed? This condition is diagnosed by measuring your blood pressure while you are seated, with your arm resting on a surface. The cuff of the blood pressure monitor  will be placed directly against the skin of your upper arm at the level of your heart. It should be measured at least twice using the same arm. Certain conditions can cause a difference in blood pressure between your right and left arms. Certain factors can cause blood pressure readings to be lower or higher than normal (elevated) for a short period of time:  When your blood pressure is higher when you are in a health care provider's office than when you are at home, this is called white coat hypertension. Most people with this condition do not need medicines.  When your blood pressure is higher at home than when you are in a health care provider's office, this is called masked hypertension. Most people with this condition may need medicines to control blood pressure. If you have a high blood pressure reading  during one visit or you have normal blood pressure with other risk factors:  You may be asked to return on a different day to have your blood pressure checked again.  You may be asked to monitor your blood pressure at home for 1 week or longer. If you are diagnosed with hypertension, you may have other blood or imaging tests to help your health care provider understand your overall risk for other conditions. How is this treated? This condition is treated by making healthy lifestyle changes, such as eating healthy foods, exercising more, and reducing your alcohol intake. Your health care provider may prescribe medicine if lifestyle changes are not enough to get your blood pressure under control, and if:  Your systolic blood pressure is above 130.  Your diastolic blood pressure is above 80. Your personal target blood pressure may vary depending on your medical conditions, your age, and other factors. Follow these instructions at home: Eating and drinking   Eat a diet that is high in fiber and potassium, and low in sodium, added sugar, and fat. An example eating plan is called the DASH (Dietary Approaches to Stop Hypertension) diet. To eat this way: ? Eat plenty of fresh fruits and vegetables. Try to fill half of your plate at each meal with fruits and vegetables. ? Eat whole grains, such as whole wheat pasta, brown rice, or whole grain bread. Fill about one quarter of your plate with whole grains. ? Eat or drink low-fat dairy products, such as skim milk or low-fat yogurt. ? Avoid fatty cuts of meat, processed or cured meats, and poultry with skin. Fill about one quarter of your plate with lean proteins, such as fish, chicken without skin, beans, eggs, and tofu. ? Avoid premade and processed foods. These tend to be higher in sodium, added sugar, and fat.  Reduce your daily sodium intake. Most people with hypertension should eat less than 1,500 mg of sodium a day.  Limit alcohol intake to no  more than 1 drink a day for nonpregnant women and 2 drinks a day for men. One drink equals 12 oz of beer, 5 oz of wine, or 1 oz of hard liquor. Lifestyle   Work with your health care provider to maintain a healthy body weight or to lose weight. Ask what an ideal weight is for you.  Get at least 30 minutes of exercise that causes your heart to beat faster (aerobic exercise) most days of the week. Activities may include walking, swimming, or biking.  Include exercise to strengthen your muscles (resistance exercise), such as pilates or lifting weights, as part of your weekly exercise routine. Try to do  these types of exercises for 30 minutes at least 3 days a week.  Do not use any products that contain nicotine or tobacco, such as cigarettes and e-cigarettes. If you need help quitting, ask your health care provider.  Monitor your blood pressure at home as told by your health care provider.  Keep all follow-up visits as told by your health care provider. This is important. Medicines  Take over-the-counter and prescription medicines only as told by your health care provider. Follow directions carefully. Blood pressure medicines must be taken as prescribed.  Do not skip doses of blood pressure medicine. Doing this puts you at risk for problems and can make the medicine less effective.  Ask your health care provider about side effects or reactions to medicines that you should watch for. Contact a health care provider if:  You think you are having a reaction to a medicine you are taking.  You have headaches that keep coming back (recurring).  You feel dizzy.  You have swelling in your ankles.  You have trouble with your vision. Get help right away if:  You develop a severe headache or confusion.  You have unusual weakness or numbness.  You feel faint.  You have severe pain in your chest or abdomen.  You vomit repeatedly.  You have trouble breathing. Summary  Hypertension is  when the force of blood pumping through your arteries is too strong. If this condition is not controlled, it may put you at risk for serious complications.  Your personal target blood pressure may vary depending on your medical conditions, your age, and other factors. For most people, a normal blood pressure is less than 120/80.  Hypertension is treated with lifestyle changes, medicines, or a combination of both. Lifestyle changes include weight loss, eating a healthy, low-sodium diet, exercising more, and limiting alcohol. This information is not intended to replace advice given to you by your health care provider. Make sure you discuss any questions you have with your health care provider. Document Released: 06/19/2005 Document Revised: 05/17/2016 Document Reviewed: 05/17/2016 Elsevier Interactive Patient Education  2019 [employer]  Muscle Strain A muscle strain is an injury that happens when a muscle is stretched longer than normal. This can happen during a fall, sports, or lifting. This can tear some muscle fibers. Usually, recovery from muscle strain takes 1-2 weeks. Complete healing normally takes 5-6 weeks. This condition is first treated with PRICE therapy. This involves:  Protecting your muscle from being injured again.  Resting your injured muscle.  Icing your injured muscle.  Applying pressure (compression) to your injured muscle. This may be done with a splint or elastic bandage.  Raising (elevating) your injured muscle. Your doctor may also recommend medicine for pain. Follow these instructions at home: If you have a splint:  Wear the splint as told by your doctor. Take it off only as told by your doctor.  Loosen the splint if your fingers or toes tingle, get numb, or turn cold and blue.  Keep the splint clean.  If the splint is not waterproof: ? Do not let it get wet. ? Cover it with a watertight covering when you take a bath or a shower. Managing pain, stiffness,  and swelling   If directed, put ice on your injured area. ? If you have a removable splint, take it off as told by your doctor. ? Put ice in a plastic bag. ? Place a towel between your skin and the bag. ? Leave the ice  on for 20 minutes, 2-3 times a day.  Move your fingers or toes often. This helps to avoid stiffness and lessen swelling.  Raise your injured area above the level of your heart while you are sitting or lying down.  Wear an elastic bandage as told by your doctor. Make sure it is not too tight. General instructions  Take over-the-counter and prescription medicines only as told by your doctor.  Limit your activity. Rest your injured muscle as told by your doctor. Your doctor may say that gentle movements are okay.  If physical therapy was prescribed, do exercises as told by your doctor.  Do not put pressure on any part of the splint until it is fully hardened. This may take many hours.  Do not use any products that contain nicotine or tobacco, such as cigarettes and e-cigarettes. These can delay bone healing. If you need help quitting, ask your doctor.  Warm up before you exercise. This helps to prevent more muscle strains.  Ask your doctor when it is safe to drive if you have a splint.  Keep all follow-up visits as told by your doctor. This is important. Contact a doctor if:  You have more pain or swelling in your injured area. Get help right away if:  You have any of these problems in your injured area: ? You have numbness. ? You have tingling. ? You lose a lot of strength. Summary  A muscle strain is an injury that happens when a muscle is stretched longer than normal.  This condition is first treated with PRICE therapy. This includes protecting, resting, icing, adding pressure, and raising your injury.  Limit your activity. Rest your injured muscle as told by your doctor. Your doctor may say that gentle movements are okay.  Warm up before you exercise.  This helps to prevent more muscle strains. This information is not intended to replace advice given to you by your health care provider. Make sure you discuss any questions you have with your health care provider. Document Released: 03/28/2008 Document Revised: 07/26/2016 Document Reviewed: 07/26/2016 Elsevier Interactive Patient Education  2019 [employer]  Cast or Splint Care, Adult Casts and splints are supports that are worn to protect broken bones and other injuries. A cast or splint may hold a bone still and in the correct position while it heals. Casts and splints may also help to ease pain, swelling, and muscle spasms. How to care for your cast   Do not stick anything inside the cast to scratch your skin.  Check the skin around the cast every day. Tell your doctor about any concerns.  You may put lotion on dry skin around the edges of the cast. Do not put lotion on the skin under the cast.  Keep the cast clean.  If the cast is not waterproof: ? Do not let it get wet. ? Cover it with a watertight covering when you take a bath or a shower. How to care for your splint   Wear it as told by your doctor. Take it off only as told by your doctor.  Loosen the splint if your fingers or toes tingle, get numb, or turn cold and blue.  Keep the splint clean.  If the splint is not waterproof: ? Do not let it get wet. ? Cover it with a watertight covering when you take a bath or a shower. Follow these instructions at home: Bathing  Do not take baths or swim until your doctor says it  is okay. Ask your doctor if you can take showers. You may only be allowed to take sponge baths for bathing.  If your cast or splint is not waterproof, cover it with a watertight covering when you take a bath or shower. Managing pain, stiffness, and swelling  Move your fingers or toes often to avoid stiffness and to lessen swelling.  Raise (elevate) the injured area above the level of your heart while  sitting or lying down. Safety  Do not use the injured limb to support your body weight until your doctor says that it is okay.  Use crutches or other assistive devices as told by your doctor. General instructions  Do not put pressure on any part of the cast or splint until it is fully hardened. This may take many hours.  Return to your normal activities as told by your doctor. Ask your doctor what activities are safe for you.  Keep all follow-up visits as told by your doctor. This is important. Contact a doctor if:  Your cast or splint gets damaged.  The skin around the cast gets red or raw.  The skin under the cast is very itchy or painful.  Your cast or splint feels very uncomfortable.  Your cast or splint is too tight or too loose.  Your cast becomes wet or it starts to have a soft spot or area.  You get an object stuck under your cast. Get help right away if:  Your pain gets worse.  The injured area tingles, gets numb, or turns blue and cold.  The part of your body above or below the cast is swollen and it turns a different color (is discolored).  You cannot feel or move your fingers or toes.  There is fluid leaking through the cast.  You have very bad pain or pressure under the cast.  You have trouble breathing.  You have shortness of breath.  You have chest pain. This information is not intended to replace advice given to you by your health care provider. Make sure you discuss any questions you have with your health care provider. Document Released: 10/19/2010 Document Revised: 06/09/2016 Document Reviewed: 06/09/2016 Elsevier Interactive Patient Education  2019 [employer] Ibuprofen tablets and capsules What is this medicine? IBUPROFEN (eye BYOO proe fen) is a non-steroidal anti-inflammatory drug (NSAID). It is used for dental pain, fever, headaches or migraines, osteoarthritis, rheumatoid arthritis, or painful monthly periods. It can also relieve minor  aches and pains caused by a cold, flu, or sore throat. This medicine may be used for other purposes; ask your health care provider or pharmacist if you have questions. COMMON BRAND NAME(S): Advil, Advil Junior Strength, Advil Migraine, Genpril, Ibren, IBU, Midol, Midol Cramps and Body Aches, Motrin, Motrin IB, Motrin Junior Strength, Motrin Migraine Pain, Samson-8, Toxicology Saliva Collection What should I tell my health care provider before I take this medicine? They need to know if you have any of these conditions: -cigarette smoker -coronary artery bypass graft (CABG) surgery within the past 2 weeks -drink more than 3 alcohol-containing drinks a day -heart disease -high blood pressure -history of stomach bleeding -kidney disease -liver disease -lung or breathing disease, like asthma -an unusual or allergic reaction to ibuprofen, aspirin, other NSAIDs, other medicines, foods, dyes, or preservatives -pregnant or trying to get pregnant -breast-feeding How should I use this medicine? Take this medicine by mouth with a glass of water. Follow the directions on the prescription label. Take this medicine with food if your  stomach gets upset. Try to not lie down for at least 10 minutes after you take the medicine. Take your medicine at regular intervals. Do not take your medicine more often than directed. A special MedGuide will be given to you by the pharmacist with each prescription and refill. Be sure to read this information carefully each time. Talk to your pediatrician regarding the use of this medicine in children. Special care may be needed. Overdosage: If you think you have taken too much of this medicine contact a poison control center or emergency room at once. NOTE: This medicine is only for you. Do not share this medicine with others. What if I miss a dose? If you miss a dose, take it as soon as you can. If it is almost time for your next dose, take only that dose. Do not take double  or extra doses. What may interact with this medicine? Do not take this medicine with any of the following medications: -cidofovir -ketorolac -methotrexate -pemetrexed This medicine may also interact with the following medications: -alcohol -aspirin -diuretics -lithium -other drugs for inflammation like prednisone -warfarin This list may not describe all possible interactions. Give your health care provider a list of all the medicines, herbs, non-prescription drugs, or dietary supplements you use. Also tell them if you smoke, drink alcohol, or use illegal drugs. Some items may interact with your medicine. What should I watch for while using this medicine? Tell your doctor or healthcare professional if your symptoms do not start to get better or if they get worse. This medicine does not prevent heart attack or stroke. In fact, this medicine may increase the chance of a heart attack or stroke. The chance may increase with longer use of this medicine and in people who have heart disease. If you take aspirin to prevent heart attack or stroke, talk with your doctor or health care professional. Do not take other medicines that contain aspirin, ibuprofen, or naproxen with this medicine. Side effects such as stomach upset, nausea, or ulcers may be more likely to occur. Many medicines available without a prescription should not be taken with this medicine. This medicine can cause ulcers and bleeding in the stomach and intestines at any time during treatment. Ulcers and bleeding can happen without warning symptoms and can cause death. To reduce your risk, do not smoke cigarettes or drink alcohol while you are taking this medicine. You may get drowsy or dizzy. Do not drive, use machinery, or do anything that needs mental alertness until you know how this medicine affects you. Do not stand or sit up quickly, especially if you are an older patient. This reduces the risk of dizzy or fainting spells. This  medicine can cause you to bleed more easily. Try to avoid damage to your teeth and gums when you brush or floss your teeth. This medicine may be used to treat migraines. If you take migraine medicines for 10 or more days a month, your migraines may get worse. Keep a diary of headache days and medicine use. Contact your healthcare professional if your migraine attacks occur more frequently. What side effects may I notice from receiving this medicine? Side effects that you should report to your doctor or health care professional as soon as possible: -allergic reactions like skin rash, itching or hives, swelling of the face, lips, or tongue -severe stomach pain -signs and symptoms of bleeding such as bloody or black, tarry stools; red or dark-brown urine; spitting up blood or brown material that  looks like coffee grounds; red spots on the skin; unusual bruising or bleeding from the eye, gums, or nose -signs and symptoms of a blood clot such as changes in vision; chest pain; severe, sudden headache; trouble speaking; sudden numbness or weakness of the face, arm, or leg -unexplained weight gain or swelling -unusually weak or tired -yellowing of eyes or skin Side effects that usually do not require medical attention (report to your doctor or health care professional if they continue or are bothersome): -bruising -diarrhea -dizziness, drowsiness -headache -nausea, vomiting This list may not describe all possible side effects. Call your doctor for medical advice about side effects. You may report side effects to FDA at 1-800-FDA-1088. Where should I keep my medicine? Keep out of the reach of children. Store at room temperature between 15 and 30 degrees C (59 and 86 degrees F). Keep container tightly closed. Throw away any unused medicine after the expiration date. NOTE: This sheet is a summary. It may not cover all possible information. If you have questions about this medicine, talk to your doctor,  pharmacist, or health care provider.  2019 Elsevier/Gold Standard (2017-02-21 12:43:57)

## 2018-11-14 DIAGNOSIS — R0789 Other chest pain: Secondary | ICD-10-CM | POA: Insufficient documentation

## 2018-11-14 DIAGNOSIS — S66911A Strain of unspecified muscle, fascia and tendon at wrist and hand level, right hand, initial encounter: Secondary | ICD-10-CM | POA: Insufficient documentation

## 2018-11-14 LAB — CBC WITH DIFFERENTIAL/PLATELET
Basophils Absolute: 0.1 10*3/uL (ref 0.0–0.2)
Basos: 1 %
EOS (ABSOLUTE): 0.2 10*3/uL (ref 0.0–0.4)
Eos: 3 %
Hematocrit: 42.9 % (ref 34.0–46.6)
Hemoglobin: 14.4 g/dL (ref 11.1–15.9)
Immature Grans (Abs): 0.1 10*3/uL (ref 0.0–0.1)
Immature Granulocytes: 1 %
Lymphocytes Absolute: 2.3 10*3/uL (ref 0.7–3.1)
Lymphs: 36 %
MCH: 29.1 pg (ref 26.6–33.0)
MCHC: 33.6 g/dL (ref 31.5–35.7)
MCV: 87 fL (ref 79–97)
Monocytes Absolute: 0.4 10*3/uL (ref 0.1–0.9)
Monocytes: 6 %
Neutrophils Absolute: 3.4 10*3/uL (ref 1.4–7.0)
Neutrophils: 53 %
Platelets: 297 10*3/uL (ref 150–450)
RBC: 4.95 x10E6/uL (ref 3.77–5.28)
RDW: 14.3 % (ref 11.7–15.4)
WBC: 6.5 10*3/uL (ref 3.4–10.8)

## 2018-11-14 LAB — COMPREHENSIVE METABOLIC PANEL
ALT: 43 IU/L — ABNORMAL HIGH (ref 0–32)
AST: 31 IU/L (ref 0–40)
Albumin/Globulin Ratio: 1.6 (ref 1.2–2.2)
Albumin: 4.8 g/dL (ref 3.8–4.8)
Alkaline Phosphatase: 81 IU/L (ref 39–117)
BUN/Creatinine Ratio: 21 (ref 9–23)
BUN: 18 mg/dL (ref 6–24)
Bilirubin Total: 0.3 mg/dL (ref 0.0–1.2)
CO2: 22 mmol/L (ref 20–29)
Calcium: 9.6 mg/dL (ref 8.7–10.2)
Chloride: 98 mmol/L (ref 96–106)
Creatinine, Ser: 0.86 mg/dL (ref 0.57–1.00)
GFR calc Af Amer: 98 mL/min/{1.73_m2} (ref >59)
GFR calc non Af Amer: 85 mL/min/{1.73_m2} (ref >59)
Globulin, Total: 3 g/dL (ref 1.5–4.5)
Glucose: 88 mg/dL (ref 65–99)
Potassium: 4.1 mmol/L (ref 3.5–5.2)
Sodium: 139 mmol/L (ref 134–144)
Total Protein: 7.8 g/dL (ref 6.0–8.5)

## 2018-11-14 LAB — VITAMIN D 25 HYDROXY (VIT D DEFICIENCY, FRACTURES): Vit D, 25-Hydroxy: 9.3 ng/mL — ABNORMAL LOW (ref 30.0–100.0)

## 2018-11-14 LAB — LIPID PANEL
Chol/HDL Ratio: 2.7 ratio (ref 0.0–4.4)
Cholesterol, Total: 247 mg/dL — ABNORMAL HIGH (ref 100–199)
HDL: 91 mg/dL (ref >39)
LDL Calculated: 108 mg/dL — ABNORMAL HIGH (ref 0–99)
Triglycerides: 239 mg/dL — ABNORMAL HIGH (ref 0–149)
VLDL Cholesterol Cal: 48 mg/dL — ABNORMAL HIGH (ref 5–40)

## 2018-11-14 LAB — TSH: TSH: 2.89 u[IU]/mL (ref 0.450–4.500)

## 2018-11-14 LAB — VITAMIN B12: Vitamin B-12: 447 pg/mL (ref 232–1245)

## 2018-11-14 MED ORDER — NITROGLYCERIN 0.3 MG SL SUBL: 0.3000 mg | SUBLINGUAL_TABLET | SUBLINGUAL | 3 refills | Status: DC | PRN

## 2018-11-15 ENCOUNTER — Telehealth: Payer: Self-pay

## 2018-11-15 ENCOUNTER — Other Ambulatory Visit: Payer: Self-pay | Admitting: Family Medicine

## 2018-11-15 DIAGNOSIS — E559 Vitamin D deficiency, unspecified: Secondary | ICD-10-CM

## 2018-11-15 MED ORDER — VITAMIN D (ERGOCALCIFEROL) 1.25 MG (50000 UNIT) PO CAPS: 50000.0000 [IU] | ORAL_CAPSULE | ORAL | 3 refills | Status: DC

## 2018-11-15 NOTE — Telephone Encounter (Signed)
Patient has been scheduled with Georgie Chard on 5/22 at 1:00pm. I tried to call patient and number kept hanging up on me. I did leave a vm message on mother phone to call and get appointment information.

## 2018-11-15 NOTE — Telephone Encounter (Signed)
-----   Message from Natalie M Stroud, FNP sent at 11/14/2018 10:05 PM EDT ----- Regarding: "Follow Up Appointment" Patient is a previous patient of Dr. Nishan, Cardiologist. Please contact his office to assess if patient is eligible for follow up. Please schedule patient asap for interment chest discomfort. Thank you.   

## 2018-11-15 NOTE — Telephone Encounter (Signed)
-----   Message from Kallie Locks, FNP sent at 11/14/2018 10:05 PM EDT ----- Regarding: "Follow Up Appointment" Patient is a previous patient of Dr. Eden Emms, Cardiologist. Please contact his office to assess if patient is eligible for follow up. Please schedule patient asap for interment chest discomfort. Thank you.

## 2018-11-18 NOTE — Telephone Encounter (Signed)
Patient has been notified of appointment.  

## 2018-11-19 NOTE — Progress Notes (Signed)
This encounter was created in error - please disregard.

## 2018-11-21 ENCOUNTER — Telehealth: Payer: Self-pay | Admitting: Cardiology

## 2018-11-21 NOTE — Telephone Encounter (Signed)
Left message for patient to call me at [phone] regarding her virtual visit on 11-22-2018.

## 2018-11-22 ENCOUNTER — Telehealth: Payer: Self-pay | Admitting: Cardiology

## 2018-11-22 ENCOUNTER — Other Ambulatory Visit: Payer: Self-pay

## 2018-11-22 ENCOUNTER — Telehealth: Payer: Self-pay

## 2018-11-22 ENCOUNTER — Encounter: Payer: Medicaid Other | Admitting: Cardiology

## 2018-11-22 NOTE — Telephone Encounter (Signed)
Spoke with patient who confirmed all demographics. Patient does not have a smart phone.  She will have vitals ready for visit. Has active My Chart.

## 2018-11-22 NOTE — Telephone Encounter (Signed)
Pt was unaware of her appointment today at 1:00 pm with Georgie Chard and would call to reschedule appt when she is available per Threasa Beards, CMA. Pt will need to be unarrived.

## 2018-11-25 NOTE — Progress Notes (Signed)
Entered in error

## 2018-11-27 ENCOUNTER — Telehealth: Payer: Self-pay

## 2018-11-27 NOTE — Telephone Encounter (Signed)
[email or web address] in regards to Virtual Visit on 5/28 with Robbie Lis, PA.

## 2018-11-27 NOTE — Telephone Encounter (Signed)
   TELEPHONE CALL NOTE  This patient has been deemed a candidate for follow-up tele-health visit to limit community exposure during the Covid-19 pandemic. I spoke with the patient via phone to discuss instructions.  A Virtual Office Visit appointment type has been scheduled for 5/28 with Robbie Lis, PA, with "VIDEO" or "TELEPHONE" in the appointment notes - patient prefers Phone type.   Sigurd Sos, RN 11/27/2018 4:48 PM   Patient has consented to a Virtual Visit by Phone with Robbie Lis, PA on 5/28 @ 3:30

## 2018-11-28 ENCOUNTER — Encounter: Payer: Self-pay | Admitting: Cardiology

## 2018-11-28 ENCOUNTER — Telehealth (INDEPENDENT_AMBULATORY_CARE_PROVIDER_SITE_OTHER): Payer: Medicaid Other | Admitting: Cardiology

## 2018-11-28 ENCOUNTER — Other Ambulatory Visit: Payer: Self-pay

## 2018-11-28 VITALS — Ht 64.0 in | Wt 119.0 lb

## 2018-11-28 DIAGNOSIS — I5022 Chronic systolic (congestive) heart failure: Secondary | ICD-10-CM

## 2018-11-28 DIAGNOSIS — I10 Essential (primary) hypertension: Secondary | ICD-10-CM

## 2018-11-28 DIAGNOSIS — I420 Dilated cardiomyopathy: Secondary | ICD-10-CM

## 2018-11-28 DIAGNOSIS — Z79899 Other long term (current) drug therapy: Secondary | ICD-10-CM

## 2018-11-28 NOTE — Progress Notes (Signed)
Virtual Visit via Telephone Note   This visit type was conducted due to national recommendations for restrictions regarding the COVID-19 Pandemic (e.g. social distancing) in an effort to limit this patient's exposure and mitigate transmission in our community.  Due to her co-morbid illnesses, this patient is at least at moderate risk for complications without adequate follow up.  This format is felt to be most appropriate for this patient at this time.  The patient did not have access to video technology/had technical difficulties with video requiring transitioning to audio format only (telephone).  All issues noted in this document were discussed and addressed.  No physical exam could be performed with this format.  Please refer to the patient's chart for her  consent to telehealth for [name].   Date:  11/28/2018   ID:  [name], DOB 06/14/1976, MRN [id number]  Patient Location: Home Provider Location: Home  PCP:  Kallie LocksStroud, Natalie M, FNP  Cardiologist:  Charlton HawsPeter Nishan, MD  Electrophysiologist:  None   Evaluation Performed:  Follow-Up Visit  Chief Complaint:  Chest Pain and HTN   History of Present Illness:    [name] is a 43 y.o. female with a history of HTN, sickle cell trait, tobacco abuse, GERD, dilated cardiomyopathy and medical non-compliance.   In 2018, she was admitted with chest pain and found to be markedly hypertensive secondary to noncompliance. An echocardiogram showed and LVEF of 35-40%, mild LVH, G2DD, mild RV dysfunction. 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 She was started on Entresto however she runs out of her medications and is not always compliant with therapies. She was last seen by Dr. Eden EmmsNishan for 6 month follow up on 03/27/2018 and was noted to be more compliant, but had c/o of atypical chest pain with laughing. Given this, she underwent an exercise stress test that was low risk with no ischemia noted. EF was calculated at 48% but  was visually estimated at 55%.   Most recently, on 11/09/2018 the patient was seen in the ED for recurrent chest pain, bilateral leg pain and shortness of breath. She had an episode of chest pain while at work described as knots bilaterally under her breasts. She appeared to have some radiation with this to her right upper back. Given this, she called EMS for ED transport. She was given ASA and SL NTG in transit with no pain improvement. She also had orthopnea type symptoms with associated cough. BP in the ED was severely elevated at 177/112. EKG was non acute and similar to prior tracings. CTA was negative for PE or dissection. Troponin levels were found to be negative and there was no sign of fluid volume overload on CT and BNP was normal. COVID testting was negative. She ultimately left the ED AMA. She was given prescriptions for her HTN medications as she was noted to have not had them in approximately 1.5 months.    Visit today if for post ED f/u. She does not have a BP monitor at home, thus no reading today for visit.  However she reports that she feels significantly better since restarting her medications.  She denies any recurrent chest pain.  No dyspnea.  Also denies lower extremity edema, orthopnea and PND. Fully compliant w/ meds.   The patient does not have symptoms concerning for COVID-19 infection (fever, chills, cough, or new shortness of breath).    Past Medical History:  Diagnosis Date   Anemia    CHF (congestive heart failure) (HCC)  Common migraine with intractable migraine 01/25/2017   GERD (gastroesophageal reflux disease)    Hypertension    Miscarriage    x3   Sickle cell trait (HCC)    Past Surgical History:  Procedure Laterality Date   BREAST SURGERY     cyst removal 1990   DILATION AND CURETTAGE OF UTERUS     four miscarriages   TUBAL LIGATION Bilateral 08/17/2012   Procedure: POST PARTUM TUBAL LIGATION;  Surgeon: Allie Bossier, MD;  Location: [facility];   Service: Gynecology;  Laterality: Bilateral;     Current Meds  Medication Sig   albuterol (VENTOLIN HFA) 108 (90 Base) MCG/ACT inhaler Inhale 2 puffs into the lungs every 4 (four) hours as needed for wheezing or shortness of breath (cough, shortness of breath or wheezing.).   buPROPion (WELLBUTRIN SR) 150 MG 12 hr tablet Take 1 tablet (150 mg total) by mouth 2 (two) times daily.   carvedilol (COREG) 25 MG tablet Take 1 tablet (25 mg total) by mouth 2 (two) times daily with a meal.   cholecalciferol (VITAMIN D) 1000 units tablet Take 1 tablet (1,000 Units total) by mouth daily.   cyclobenzaprine (FLEXERIL) 10 MG tablet Take 1 tablet (10 mg total) by mouth 2 (two) times daily as needed for muscle spasms.   furosemide (LASIX) 40 MG tablet Take 1 tablet (40 mg total) by mouth daily.   gabapentin (NEURONTIN) 300 MG capsule Take 1 capsule (300 mg total) by mouth 3 (three) times daily.   hydrALAZINE (APRESOLINE) 25 MG tablet Take 1 tablet (25 mg total) by mouth 3 (three) times daily.   ibuprofen (ADVIL) 800 MG tablet Take 1 tablet (800 mg total) by mouth every 8 (eight) hours as needed.   nitroGLYCERIN (NITROSTAT) 0.3 MG SL tablet Place 1 tablet (0.3 mg total) under the tongue every 5 (five) minutes as needed for chest pain.   RESTASIS 0.05 % ophthalmic emulsion Place 1 drop into both eyes as directed.   sacubitril-valsartan (ENTRESTO) 97-103 MG Take 1 tablet by mouth 2 (two) times daily.   spironolactone (ALDACTONE) 25 MG tablet Take 1 tablet (25 mg total) by mouth daily.   sulfamethoxazole-trimethoprim (BACTRIM DS) 800-160 MG tablet Take 1 tablet by mouth 2 (two) times daily.     Allergies:   Patient has no known allergies.   Social History   Tobacco Use   Smoking status: Current Every Day Smoker    Packs/day: 0.30    Years: 10.00    Pack years: 3.00    Types: Cigarettes   Smokeless tobacco: Never Used  Substance Use Topics   Alcohol use: Yes    Comment: History of heavy  alcohol use until 5/18   Drug use: No     Family Hx: The patient's family history includes Hypertension in her maternal grandfather, maternal grandmother, and mother. There is no history of Other.  ROS:   Please see the history of present illness.     All other systems reviewed and are negative.   Prior CV studies:   The following studies were reviewed today:  Echocardiogram: 11/08/16 Study Conclusions  - Left ventricle: The cavity size was normal. Wall thickness was increased in a pattern of mild LVH. Systolic function was moderately reduced. The estimated ejection fraction was in the range of 35% to 40%. Features are consistent with a pseudonormal left ventricular filling pattern, with concomitant abnormal relaxation and increased filling pressure (grade 2 diastolic dysfunction). - Right ventricle: Systolic function was mildly reduced.  Lexiscan stress test 04/05/2018:   Nuclear stress EF is calculated at 48% but visually appears normal at 55%.  There was no ST segment deviation noted during stress.  There is evidence of apical thinning but no ischemia noted.  This is a low risk study.   Labs/Other Tests and Data Reviewed:    EKG:  An ECG dated 11/09/2018 was personally reviewed today and demonstrated:  NSR, probable LAE  Recent Labs: 11/09/2018: B Natriuretic Peptide 30.0; Magnesium 1.7 11/13/2018: ALT 43; BUN 18; Creatinine, Ser 0.86; Hemoglobin 14.4; Platelets 297; Potassium 4.1; Sodium 139; TSH 2.890   Recent Lipid Panel Lab Results  Component Value Date/Time   CHOL 247 (H) 11/13/2018 02:28 PM   TRIG 239 (H) 11/13/2018 02:28 PM   HDL 91 11/13/2018 02:28 PM   CHOLHDL 2.7 11/13/2018 02:28 PM   LDLCALC 108 (H) 11/13/2018 02:28 PM    Wt Readings from Last 3 Encounters:  11/28/18 119 lb (54 kg)  11/13/18 118 lb 12.8 oz (53.9 kg)  11/09/18 115 lb (52.2 kg)     Objective:    Vital Signs:  Ht 5\' 4"  (1.626 m)    Wt 119 lb (54 kg)    LMP  11/06/2018    BMI 20.43 kg/m    Physical Exam: Well sounding female in no acute distress.  Speaking in clear complete sentences.  Speech unlabored.  Breathing unlabored.  ASSESSMENT & PLAN:    1. Chest pain: Recent work-up in the emergency department unremarkable.  Troponins negative.  Chest CT negative for PE and dissection.  EKG without ischemic abnormalities.  Chest pain with was in the setting of severely elevated blood pressure at 177/112.  This was in the setting of medication noncompliance.  She has restarted her medications and she denies any recurrent chest pain.  In addition, patient had a low risk nuclear stress test in September 2019.  No plans for further work-up at this time.  2.  Hypertension: History of poorly controlled blood pressure in setting of medication noncompliance.  As noted above, recent ED evaluation for chest pain revealed severely elevated blood pressure at 177/112, in the setting of medication noncompliance.  She has re-resumed prior medications and now reports full compliance.  She denies chest pain, dyspnea, headaches and edema.  Unfortunately, she does not have a home blood pressure cuff available thus no reading to report today.  I discussed the importance of strict compliance with medications to reduce the risk of cardiovascular complications including heart attack ,stroke, worsening heart failure as well as kidney disease.  2.  Dilated cardiomyopathy/chronic systolic heart failure: EF 35 to 40% on last assessment.  Subsequent nuclear stress test negative for ischemia.  Dilated cardiomyopathy in the setting of poorly controlled hypertension.  Recent ED evaluation with chest x-ray showing no edema and normal BNP.  Her heart failure regimen/antihypertensives have been resumed and she now reports full compliance.  She denies dyspnea and no edema.  We discussed the importance of strict compliance with medications.  Given patient has restarted Entresto and spironolactone and  Lasix, we will plan to check a basic metabolic panel to ensure renal function and electrolytes are stable.   COVID-19 Education: The signs and symptoms of COVID-19 were discussed with the patient and how to seek care for testing (follow up with PCP or arrange E-visit).  The importance of social distancing was discussed today.  Time:   Today, I have spent 10 minutes with the patient with telehealth technology discussing the above problems.  Medication Adjustments/Labs and Tests Ordered: Current medicines are reviewed at length with the patient today.  Concerns regarding medicines are outlined above.   Tests Ordered: BMP - medication monitoring. Chronic systolic HF.    Medication Changes: No orders of the defined types were placed in this encounter. No med changes.   Disposition:  Follow up in 3 months.   Signed, Robbie Lis, PA-C  11/28/2018 3:42 PM    [facility]

## 2018-11-28 NOTE — Patient Instructions (Signed)
Medication Instructions:  none If you need a refill on your cardiac medications before your next appointment, please call your pharmacy.   Lab work: Monday BMP If you have labs (blood work) drawn today and your tests are completely normal, you will receive your results only by: Marland Kitchen MyChart Message (if you have MyChart) OR . A paper copy in the mail If you have any lab test that is abnormal or we need to change your treatment, we will call you to review the results.  Testing/Procedures: none  Follow-Up: 4 Months with Dr Eden Emms At [facility], you and your health needs are our priority.  As part of our continuing mission to provide you with exceptional heart care, we have created designated Provider Care Teams.  These Care Teams include your primary Cardiologist (physician) and Advanced Practice Providers (APPs -  Physician Assistants and Nurse Practitioners) who all work together to provide you with the care you need, when you need it.  Any Other Special Instructions Will Be Listed Below (If Applicable).   Weigh Daily:  If you gain more than 3 lbs in 1 DAY or 5 lbs in 1 WEEK, please call our office.  [phone]

## 2018-12-02 ENCOUNTER — Other Ambulatory Visit: Payer: Medicaid Other

## 2018-12-16 ENCOUNTER — Other Ambulatory Visit: Payer: Self-pay | Admitting: Family Medicine

## 2018-12-17 ENCOUNTER — Other Ambulatory Visit: Payer: Self-pay

## 2018-12-17 ENCOUNTER — Encounter: Payer: Self-pay | Admitting: Family Medicine

## 2018-12-17 ENCOUNTER — Ambulatory Visit (INDEPENDENT_AMBULATORY_CARE_PROVIDER_SITE_OTHER): Payer: Medicaid Other | Admitting: Family Medicine

## 2018-12-17 VITALS — BP 120/84 | HR 78 | Temp 98.2°F | Ht 64.0 in | Wt 122.0 lb

## 2018-12-17 DIAGNOSIS — Z862 Personal history of diseases of the blood and blood-forming organs and certain disorders involving the immune mechanism: Secondary | ICD-10-CM

## 2018-12-17 DIAGNOSIS — I5042 Chronic combined systolic (congestive) and diastolic (congestive) heart failure: Secondary | ICD-10-CM | POA: Diagnosis not present

## 2018-12-17 DIAGNOSIS — R8290 Unspecified abnormal findings in urine: Secondary | ICD-10-CM

## 2018-12-17 DIAGNOSIS — Z09 Encounter for follow-up examination after completed treatment for conditions other than malignant neoplasm: Secondary | ICD-10-CM

## 2018-12-17 DIAGNOSIS — N898 Other specified noninflammatory disorders of vagina: Secondary | ICD-10-CM

## 2018-12-17 DIAGNOSIS — R252 Cramp and spasm: Secondary | ICD-10-CM | POA: Diagnosis not present

## 2018-12-17 DIAGNOSIS — E559 Vitamin D deficiency, unspecified: Secondary | ICD-10-CM

## 2018-12-17 DIAGNOSIS — N390 Urinary tract infection, site not specified: Secondary | ICD-10-CM

## 2018-12-17 LAB — POCT URINALYSIS DIP (MANUAL ENTRY)
Bilirubin, UA: NEGATIVE
Blood, UA: NEGATIVE
Glucose, UA: NEGATIVE mg/dL
Ketones, POC UA: NEGATIVE mg/dL
Nitrite, UA: NEGATIVE
Protein Ur, POC: NEGATIVE mg/dL
Spec Grav, UA: 1.015 (ref 1.010–1.025)
Urobilinogen, UA: 0.2 E.U./dL
pH, UA: 5.5 (ref 5.0–8.0)

## 2018-12-17 MED ORDER — SULFAMETHOXAZOLE-TRIMETHOPRIM 800-160 MG PO TABS: 1.0000 | ORAL_TABLET | Freq: Two times a day (BID) | ORAL | 0 refills | Status: DC

## 2018-12-17 NOTE — Progress Notes (Signed)
[facility]   Established Patient Office Visit  Subjective:  Patient ID: [name], female    DOB: 1975/11/18  Age: 43 y.o. MRN: [id number]  CC:  Chief Complaint  Patient presents with  . Vaginal Discharge    HPI [name] is a 43 year old female who presents for follow up today.   Past Medical History:  Diagnosis Date  . Anemia   . CHF (congestive heart failure) ([place])   . Common migraine with intractable migraine 01/25/2017  . GERD (gastroesophageal reflux disease)   . Hypertension   . Miscarriage    x3  . Sickle cell trait ([place])    Current Status: Since her last office visit, she is doing well with no complaints. She denies visual changes, chest pain, cough, shortness of breath, heart palpitations, and falls. She has occasional headaches and dizziness with position changes. Denies severe headaches, confusion, seizures, double vision, and blurred vision, nausea and vomiting.She denies fevers, chills, fatigue, recent infections, weight loss, and night sweats. No reports of GI problems such as nausea, vomiting, diarrhea, and constipation. She has no reports of blood in stools, dysuria and hematuria. No depression or anxiety reported. She denies pain today.   Past Surgical History:  Procedure Laterality Date  . BREAST SURGERY     cyst removal 1990  . DILATION AND CURETTAGE OF UTERUS     four miscarriages  . TUBAL LIGATION Bilateral 08/17/2012   Procedure: POST PARTUM TUBAL LIGATION;  Surgeon: Emily Filbert, MD;  Location: [facility];  Service: Gynecology;  Laterality: Bilateral;    Family History  Problem Relation Age of Onset  . Hypertension Mother   . Hypertension Maternal Grandmother   . Hypertension Maternal Grandfather   . Other Neg Hx     Social History   Socioeconomic History  . Marital status: Single    Spouse name: Not on file  . Number of children: 2  . Years of education: 54  . Highest education  level: Not on file  Occupational History  . Occupation: Unemployed  Social Needs  . Financial resource strain: Not on file  . Food insecurity    Worry: Not on file    Inability: Not on file  . Transportation needs    Medical: Not on file    Non-medical: Not on file  Tobacco Use  . Smoking status: Current Every Day Smoker    Packs/day: 0.30    Years: 10.00    Pack years: 3.00    Types: Cigarettes  . Smokeless tobacco: Never Used  Substance and Sexual Activity  . 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 Drug use: No  . Sexual activity: Yes    Birth control/protection: None  Lifestyle  . Physical activity    Days per week: Not on file    Minutes per session: Not on file  . Stress: Not on file  Relationships  . Social Herbalist on phone: Not on file    Gets together: Not on file    Attends religious service: Not on file    Active member of club or organization: Not on file    Attends meetings of clubs or organizations: Not on file    Relationship status: Not on file  . Intimate partner violence    Fear of current or ex partner: Not on file    Emotionally abused:  Not on file    Physically abused: Not on file    Forced sexual activity: Not on file  Other Topics Concern  . Not on file  Social History Narrative   Lives with mother, Elease Hashimotoatricia   Caffeine use: Tea daily   Left handed    Outpatient Medications Prior to Visit  Medication Sig Dispense Refill  . albuterol (VENTOLIN HFA) 108 (90 Base) MCG/ACT inhaler Inhale 2 puffs into the lungs every 4 (four) hours as needed for wheezing or shortness of breath (cough, shortness of breath or wheezing.). 1 Inhaler 12  . buPROPion (WELLBUTRIN SR) 150 MG 12 hr tablet Take 1 tablet (150 mg total) by mouth 2 (two) times daily. 60 tablet 1  . carvedilol (COREG) 25 MG tablet Take 1 tablet (25 mg total) by mouth 2 (two) times daily with a meal. 60 tablet 1  . cholecalciferol (VITAMIN D) 1000  units tablet Take 1 tablet (1,000 Units total) by mouth daily. 30 tablet 4  . cyclobenzaprine (FLEXERIL) 10 MG tablet Take 1 tablet (10 mg total) by mouth 2 (two) times daily as needed for muscle spasms. 20 tablet 0  . furosemide (LASIX) 40 MG tablet Take 1 tablet (40 mg total) by mouth daily. 30 tablet 1  . gabapentin (NEURONTIN) 300 MG capsule Take 1 capsule (300 mg total) by mouth 3 (three) times daily. 90 capsule 1  . hydrALAZINE (APRESOLINE) 25 MG tablet Take 1 tablet (25 mg total) by mouth 3 (three) times daily. 90 tablet 1  . ibuprofen (ADVIL) 800 MG tablet Take 1 tablet (800 mg total) by mouth every 8 (eight) hours as needed. 30 tablet 2  . nitroGLYCERIN (NITROSTAT) 0.3 MG SL tablet Place 1 tablet (0.3 mg total) under the tongue every 5 (five) minutes as needed for chest pain. 90 tablet 3  . RESTASIS 0.05 % ophthalmic emulsion Place 1 drop into both eyes as directed. 0.4 mL 3  . sacubitril-valsartan (ENTRESTO) 97-103 MG Take 1 tablet by mouth 2 (two) times daily. 180 tablet 2  . spironolactone (ALDACTONE) 25 MG tablet Take 1 tablet (25 mg total) by mouth daily. 30 tablet 1  . sulfamethoxazole-trimethoprim (BACTRIM DS) 800-160 MG tablet Take 1 tablet by mouth 2 (two) times daily. (Patient not taking: Reported on 12/17/2018) 14 tablet 0   No facility-administered medications prior to visit.     No Known Allergies  ROS Review of Systems  Constitutional: Negative.   HENT: Negative.   Eyes: Negative.   Respiratory: Negative.   Cardiovascular: Negative.   Gastrointestinal: Negative.   Endocrine: Negative.   Genitourinary: Negative.   Musculoskeletal: Negative.   Skin: Negative.   Allergic/Immunologic: Negative.   Neurological: Positive for dizziness (occaisonal) and headaches (occasional).  Hematological: Negative.   Psychiatric/Behavioral: Negative.       Objective:    Physical Exam  Constitutional: She is oriented to person, place, and time. She appears well-developed and  well-nourished.  HENT:  Head: Normocephalic and atraumatic.  Eyes: Conjunctivae are normal.  Neck: Normal range of motion. Neck supple.  Cardiovascular: Normal rate, regular rhythm, normal heart sounds and intact distal pulses.  Pulmonary/Chest: Effort normal and breath sounds normal.  Abdominal: Soft. Bowel sounds are normal.  Musculoskeletal: Normal range of motion.  Neurological: She is alert and oriented to person, place, and time. She has normal reflexes.  Skin: Skin is warm and dry.  Psychiatric: She has a normal mood and affect. Her behavior is normal. Judgment and thought content normal.  Nursing note  and vitals reviewed.   BP 120/84 (BP Location: Left Arm, Patient Position: Sitting, Cuff Size: Small)   Pulse 78   Temp 98.2 F (36.8 C) (Oral)   Ht 5\' 4"  (1.626 m)   Wt 122 lb (55.3 kg)   LMP 12/13/2018   SpO2 100%   BMI 20.94 kg/m  Wt Readings from Last 3 Encounters:  12/17/18 122 lb (55.3 kg)  11/28/18 119 lb (54 kg)  11/13/18 118 lb 12.8 oz (53.9 kg)     Health Maintenance Due  Topic Date Due  . PAP SMEAR-Modifier  01/06/1999    There are no preventive care reminders to display for this patient.  Lab Results  Component Value Date   TSH 2.890 11/13/2018   Lab Results  Component Value Date   WBC 6.5 11/13/2018   HGB 14.4 11/13/2018   HCT 42.9 11/13/2018   MCV 87 11/13/2018   PLT 297 11/13/2018   Lab Results  Component Value Date   NA 139 11/13/2018   K 4.1 11/13/2018   CO2 22 11/13/2018   GLUCOSE 88 11/13/2018   BUN 18 11/13/2018   CREATININE 0.86 11/13/2018   BILITOT 0.3 11/13/2018   ALKPHOS 81 11/13/2018   AST 31 11/13/2018   ALT 43 (H) 11/13/2018   PROT 7.8 11/13/2018   ALBUMIN 4.8 11/13/2018   CALCIUM 9.6 11/13/2018   ANIONGAP 19 (H) 11/09/2018   Lab Results  Component Value Date   CHOL 247 (H) 11/13/2018   Lab Results  Component Value Date   HDL 91 11/13/2018   Lab Results  Component Value Date   LDLCALC 108 (H) 11/13/2018    Lab Results  Component Value Date   TRIG 239 (H) 11/13/2018   Lab Results  Component Value Date   CHOLHDL 2.7 11/13/2018   Lab Results  Component Value Date   HGBA1C 4.9 11/13/2018      Assessment & Plan:   1. Chronic combined systolic and diastolic congestive heart failure (HCC) Stable today.   2. Vaginal discharge Results are pending.  - POCT urinalysis dipstick - NuSwab Vaginitis Plus (VG+) - sulfamethoxazole-trimethoprim (BACTRIM DS) 800-160 MG tablet; Take 1 tablet by mouth 2 (two) times daily.  Dispense: 14 tablet; Refill: 0  3. Bilateral leg cramps  4. History of anemia - Iron and TIBC([facility]); Future - Ferritin; Future  5. Urinary tract infection without hematuria, site unspecified  6. Vitamin D deficiency  7. Abnormal urinalysis - Urine Culture  8. Follow up She will follow up for labs only in 2 weeks. She will follow up in 6 months for office visit.   Meds ordered this encounter  Medications  . sulfamethoxazole-trimethoprim (BACTRIM DS) 800-160 MG tablet    Sig: Take 1 tablet by mouth 2 (two) times daily.    Dispense:  14 tablet    Refill:  0    Orders Placed This Encounter  Procedures  . Urine Culture  . NuSwab Vaginitis Plus (VG+)  . Iron and TIBC([facility]/Sunquest)  . Ferritin  . POCT urinalysis dipstick    Referral Orders  No referral(s) requested today    Raliegh IpNatalie Shantina Chronister,  MSN, FNP-BC [facility] [address]  [place], [place] [id number] [phone]    Problem List Items Addressed This Visit      Cardiovascular and Mediastinum   Chronic combined systolic and diastolic congestive heart failure (HCC) - Primary    Other Visit Diagnoses    Vaginal discharge  Relevant Medications   sulfamethoxazole-trimethoprim (BACTRIM DS) 800-160 MG tablet   Other Relevant Orders   POCT urinalysis dipstick (Completed)   NuSwab Vaginitis Plus (VG+) (Completed)   Bilateral leg cramps        History of anemia       Relevant Orders   Iron and TIBC([facility]/Sunquest)   Ferritin   Urinary tract infection without hematuria, site unspecified       Relevant Medications   sulfamethoxazole-trimethoprim (BACTRIM DS) 800-160 MG tablet   Vitamin D deficiency       Abnormal urinalysis       Relevant Orders   Urine Culture (Completed)   Follow up          Meds ordered this encounter  Medications  . sulfamethoxazole-trimethoprim (BACTRIM DS) 800-160 MG tablet    Sig: Take 1 tablet by mouth 2 (two) times daily.    Dispense:  14 tablet    Refill:  0    Follow-up: No follow-ups on file.    Kallie LocksNatalie M Kylia Grajales, FNP

## 2018-12-19 ENCOUNTER — Other Ambulatory Visit: Payer: Self-pay | Admitting: Family Medicine

## 2018-12-19 DIAGNOSIS — R252 Cramp and spasm: Secondary | ICD-10-CM | POA: Insufficient documentation

## 2018-12-19 DIAGNOSIS — N760 Acute vaginitis: Secondary | ICD-10-CM

## 2018-12-19 DIAGNOSIS — E559 Vitamin D deficiency, unspecified: Secondary | ICD-10-CM | POA: Insufficient documentation

## 2018-12-19 DIAGNOSIS — B9689 Other specified bacterial agents as the cause of diseases classified elsewhere: Secondary | ICD-10-CM

## 2018-12-19 LAB — URINE CULTURE

## 2018-12-19 MED ORDER — METRONIDAZOLE 500 MG PO TABS: 500.0000 mg | ORAL_TABLET | Freq: Two times a day (BID) | ORAL | 0 refills | Status: AC

## 2018-12-21 LAB — NUSWAB VAGINITIS PLUS (VG+)
Atopobium vaginae: HIGH Score — AB
BVAB 2: HIGH Score — AB
Candida albicans, NAA: NEGATIVE
Candida glabrata, NAA: NEGATIVE
Chlamydia trachomatis, NAA: NEGATIVE
Neisseria gonorrhoeae, NAA: NEGATIVE
Trich vag by NAA: POSITIVE — AB

## 2018-12-24 ENCOUNTER — Telehealth: Payer: Self-pay

## 2018-12-24 MED ORDER — CYCLOBENZAPRINE HCL 10 MG PO TABS: 10.0000 mg | ORAL_TABLET | Freq: Two times a day (BID) | ORAL | 0 refills | Status: DC | PRN

## 2018-12-24 NOTE — Telephone Encounter (Signed)
Medication sent to pharmacy  

## 2018-12-24 NOTE — Telephone Encounter (Signed)
Patient requesting refill on the Cyclobenzaprine

## 2018-12-31 ENCOUNTER — Other Ambulatory Visit: Payer: Medicaid Other

## 2018-12-31 ENCOUNTER — Other Ambulatory Visit: Payer: Self-pay

## 2018-12-31 DIAGNOSIS — Z862 Personal history of diseases of the blood and blood-forming organs and certain disorders involving the immune mechanism: Secondary | ICD-10-CM

## 2018-12-31 NOTE — Telephone Encounter (Signed)
Patient came into office for labs and was notified of results

## 2019-01-01 LAB — IRON AND TIBC
Iron Saturation: 39 % (ref 15–55)
Iron: 116 ug/dL (ref 27–159)
Total Iron Binding Capacity: 296 ug/dL (ref 250–450)
UIBC: 180 ug/dL (ref 131–425)

## 2019-01-01 LAB — FERRITIN: Ferritin: 161 ng/mL — ABNORMAL HIGH (ref 15–150)

## 2019-01-22 ENCOUNTER — Other Ambulatory Visit: Payer: Self-pay

## 2019-01-22 DIAGNOSIS — Z20822 Contact with and (suspected) exposure to covid-19: Secondary | ICD-10-CM

## 2019-01-26 LAB — NOVEL CORONAVIRUS, NAA: SARS-CoV-2, NAA: NOT DETECTED

## 2019-02-03 ENCOUNTER — Telehealth: Payer: Self-pay

## 2019-02-03 ENCOUNTER — Other Ambulatory Visit: Payer: Self-pay

## 2019-02-03 MED ORDER — HYDRALAZINE HCL 25 MG PO TABS: 25.0000 mg | ORAL_TABLET | Freq: Three times a day (TID) | ORAL | 1 refills | Status: DC

## 2019-02-03 MED ORDER — CARVEDILOL 25 MG PO TABS: 25.0000 mg | ORAL_TABLET | Freq: Two times a day (BID) | ORAL | 1 refills | Status: DC

## 2019-02-03 MED ORDER — ENTRESTO 97-103 MG PO TABS: 1.0000 | ORAL_TABLET | Freq: Two times a day (BID) | ORAL | 2 refills | Status: DC

## 2019-02-03 MED ORDER — BUPROPION HCL ER (SR) 150 MG PO TB12: 150.0000 mg | ORAL_TABLET | Freq: Two times a day (BID) | ORAL | 1 refills | Status: DC

## 2019-02-03 MED ORDER — SPIRONOLACTONE 25 MG PO TABS: 25.0000 mg | ORAL_TABLET | Freq: Every day | ORAL | 1 refills | Status: DC

## 2019-02-05 NOTE — Telephone Encounter (Signed)
Medication have been sent into pharmacy

## 2019-04-02 ENCOUNTER — Emergency Department (HOSPITAL_COMMUNITY)
Admission: EM | Admit: 2019-04-02 | Discharge: 2019-04-02 | Disposition: A | Discharge status: Home / Self Care | Payer: Medicaid Other | Attending: Emergency Medicine | Admitting: Emergency Medicine

## 2019-04-02 ENCOUNTER — Emergency Department (HOSPITAL_COMMUNITY): Payer: Medicaid Other

## 2019-04-02 ENCOUNTER — Other Ambulatory Visit: Payer: Self-pay

## 2019-04-02 DIAGNOSIS — R1011 Right upper quadrant pain: Secondary | ICD-10-CM | POA: Insufficient documentation

## 2019-04-02 DIAGNOSIS — Z79899 Other long term (current) drug therapy: Secondary | ICD-10-CM | POA: Insufficient documentation

## 2019-04-02 DIAGNOSIS — R42 Dizziness and giddiness: Secondary | ICD-10-CM | POA: Insufficient documentation

## 2019-04-02 DIAGNOSIS — I10 Essential (primary) hypertension: Secondary | ICD-10-CM | POA: Insufficient documentation

## 2019-04-02 DIAGNOSIS — I509 Heart failure, unspecified: Secondary | ICD-10-CM | POA: Diagnosis not present

## 2019-04-02 DIAGNOSIS — R1013 Epigastric pain: Secondary | ICD-10-CM

## 2019-04-02 DIAGNOSIS — M62831 Muscle spasm of calf: Secondary | ICD-10-CM | POA: Insufficient documentation

## 2019-04-02 DIAGNOSIS — F17210 Nicotine dependence, cigarettes, uncomplicated: Secondary | ICD-10-CM | POA: Insufficient documentation

## 2019-04-02 DIAGNOSIS — K2920 Alcoholic gastritis without bleeding: Secondary | ICD-10-CM | POA: Diagnosis not present

## 2019-04-02 DIAGNOSIS — F1290 Cannabis use, unspecified, uncomplicated: Secondary | ICD-10-CM | POA: Diagnosis not present

## 2019-04-02 LAB — URINALYSIS, ROUTINE W REFLEX MICROSCOPIC
Bacteria, UA: NONE SEEN
Bilirubin Urine: NEGATIVE
Glucose, UA: NEGATIVE mg/dL
Hgb urine dipstick: NEGATIVE
Ketones, ur: NEGATIVE mg/dL
Nitrite: NEGATIVE
Protein, ur: NEGATIVE mg/dL
Specific Gravity, Urine: 1.003 — ABNORMAL LOW (ref 1.005–1.030)
pH: 6 (ref 5.0–8.0)

## 2019-04-02 LAB — BASIC METABOLIC PANEL
Anion gap: 12 (ref 5–15)
BUN: 13 mg/dL (ref 6–20)
CO2: 18 mmol/L — ABNORMAL LOW (ref 22–32)
Calcium: 8.7 mg/dL — ABNORMAL LOW (ref 8.9–10.3)
Chloride: 108 mmol/L (ref 98–111)
Creatinine, Ser: 1.02 mg/dL — ABNORMAL HIGH (ref 0.44–1.00)
GFR calc Af Amer: >60 mL/min (ref >60)
GFR calc non Af Amer: >60 mL/min (ref >60)
Glucose, Bld: 90 mg/dL (ref 70–99)
Potassium: 3.8 mmol/L (ref 3.5–5.1)
Sodium: 138 mmol/L (ref 135–145)

## 2019-04-02 LAB — HEPATIC FUNCTION PANEL
ALT: 47 U/L — ABNORMAL HIGH (ref 0–44)
AST: 49 U/L — ABNORMAL HIGH (ref 15–41)
Albumin: 3.5 g/dL (ref 3.5–5.0)
Alkaline Phosphatase: 60 U/L (ref 38–126)
Bilirubin, Direct: 0.1 mg/dL (ref 0.0–0.2)
Indirect Bilirubin: 0.3 mg/dL (ref 0.3–0.9)
Total Bilirubin: 0.4 mg/dL (ref 0.3–1.2)
Total Protein: 6.6 g/dL (ref 6.5–8.1)

## 2019-04-02 LAB — CBC
HCT: 37.3 % (ref 36.0–46.0)
Hemoglobin: 12.6 g/dL (ref 12.0–15.0)
MCH: 30.1 pg (ref 26.0–34.0)
MCHC: 33.8 g/dL (ref 30.0–36.0)
MCV: 89 fL (ref 80.0–100.0)
Platelets: 227 10*3/uL (ref 150–400)
RBC: 4.19 MIL/uL (ref 3.87–5.11)
RDW: 13.9 % (ref 11.5–15.5)
WBC: 8.2 10*3/uL (ref 4.0–10.5)
nRBC: 0 % (ref 0.0–0.2)

## 2019-04-02 LAB — RAPID URINE DRUG SCREEN, HOSP PERFORMED
Amphetamines: NOT DETECTED
Barbiturates: NOT DETECTED
Benzodiazepines: NOT DETECTED
Cocaine: NOT DETECTED
Opiates: NOT DETECTED
Tetrahydrocannabinol: POSITIVE — AB

## 2019-04-02 LAB — TROPONIN I (HIGH SENSITIVITY)
Troponin I (High Sensitivity): 5 ng/L (ref <18)
Troponin I (High Sensitivity): 5 ng/L (ref <18)

## 2019-04-02 LAB — PREGNANCY, URINE: Preg Test, Ur: NEGATIVE

## 2019-04-02 LAB — LIPASE, BLOOD: Lipase: 35 U/L (ref 11–51)

## 2019-04-02 MED ORDER — FAMOTIDINE 20 MG PO TABS: 20.0000 mg | ORAL_TABLET | Freq: Two times a day (BID) | ORAL | 0 refills | Status: DC

## 2019-04-02 MED ORDER — LORAZEPAM 2 MG/ML IJ SOLN
1.0000 mg | Freq: Once | INTRAMUSCULAR | Status: AC
  Administered 2019-04-02: 1 mg via INTRAVENOUS
  Filled 2019-04-02: qty 1

## 2019-04-02 MED ORDER — KETOROLAC TROMETHAMINE 15 MG/ML IJ SOLN
15.0000 mg | Freq: Once | INTRAMUSCULAR | Status: AC
  Administered 2019-04-02: 15 mg via INTRAVENOUS
  Filled 2019-04-02: qty 1

## 2019-04-02 MED ORDER — LIDOCAINE VISCOUS HCL 2 % MT SOLN
15.0000 mL | Freq: Once | OROMUCOSAL | Status: AC
  Administered 2019-04-02: 15:00:00 15 mL via ORAL
  Filled 2019-04-02: qty 15

## 2019-04-02 MED ORDER — ALUM & MAG HYDROXIDE-SIMETH 200-200-20 MG/5ML PO SUSP
30.0000 mL | Freq: Once | ORAL | Status: AC
  Administered 2019-04-02: 30 mL via ORAL
  Filled 2019-04-02: qty 30

## 2019-04-02 MED ORDER — SODIUM CHLORIDE 0.9 % IV BOLUS (SEPSIS)
1000.0000 mL | Freq: Once | INTRAVENOUS | Status: AC
  Administered 2019-04-02: 1000 mL via INTRAVENOUS

## 2019-04-02 MED ORDER — MORPHINE SULFATE (PF) 4 MG/ML IV SOLN
4.0000 mg | Freq: Once | INTRAVENOUS | Status: AC
  Administered 2019-04-02: 15:00:00 4 mg via INTRAVENOUS
  Filled 2019-04-02: qty 1

## 2019-04-02 NOTE — Discharge Instructions (Addendum)
Please follow up with your PCP regarding your ED visit today Pick up medication at your pharmacy and take as prescribed - your symptoms may be related to alcohol use.  Attached is a Warden/ranger for outpatient counseling regarding alcohol use if you are interested in quitting Please increase fluid intake as well until you can see your PCP

## 2019-04-02 NOTE — ED Triage Notes (Addendum)
Pt here from work for intermittent dizziness x 2 days, chest pressure since last night. Pain does not radiate, but pt sts all of her limbs feel heavy. Pt took four nitro and 325 ASA before EMS arrival, with increase in dizziness and some relief in chest pain. 350 NS bolus given PTA for borderline hypotension from nitro with improvement. Pt sts the pain feels like a knot in her chest that goes down to her stomach at times.

## 2019-04-02 NOTE — ED Provider Notes (Signed)
[facility] Provider Note   CSN: [id number] Arrival date & time: 04/02/19  1327     History   Chief Complaint Chief Complaint  Patient presents with  . Chest Pain    HPI [name] is a 43 y.o. female.     Patient is a 43 year old female with past medical history of hypertension, CHF, anemia, vitamin D deficiency presenting to the emergency department for chest pain.  Reports that she has been having episodes of feeling like a knot in her chest off and on for a while but that at 10 PM last night she began to have constant and severe symptoms.  Reports that she is having bilateral leg cramping which "came up all the way into the my chest".  Reports it feels like a knot in her epigastric area and substernal.  Denies any shortness of breath, nausea, vomiting.  Reports feeling lightheaded and dizzy.  Reports that this happens every time she takes a specific pill.  Patient handed me the pill and after looking up it appears that this is her carvedilol 25 mg pill.  Patient is tearful.  She admits to marijuana use and drinking alcohol.  She smokes marijuana this morning     Past Medical History:  Diagnosis Date  . Anemia   . CHF (congestive heart failure) (HCC)   . Common migraine with intractable migraine 01/25/2017  . GERD (gastroesophageal reflux disease)   . Hypertension   . Miscarriage    x3  . Sickle cell trait [facility](HCC)     Patient Active Problem List   Diagnosis Date Noted  . Bilateral leg cramps 12/19/2018  . Vitamin D deficiency 12/19/2018  . Chest discomfort 11/14/2018  . Wrist strain, right, initial encounter 11/14/2018  . Common migraine with intractable migraine 01/25/2017  . Hypertension 12/27/2016  . Chronic combined systolic and diastolic congestive heart failure (HCC) 11/09/2016  . Chest pain 11/08/2016  . Hypertensive urgency 11/08/2016  . Admission for sterilization 08/17/2012  . Spontaneous vaginal delivery  08/16/2012  . Consultation for sterilization 07/25/2012  . Poor fetal growth, affecting management of mother, antepartum condition or complication 07/11/2012  . Chronic hypertension complicating or reason for care during pregnancy 04/30/2012  . Supervision of high-risk pregnancy 04/30/2012  . Sickle cell trait (HCC) 04/30/2012    Past Surgical History:  Procedure Laterality Date  . BREAST SURGERY     cyst removal 1990  . DILATION AND CURETTAGE OF UTERUS     four miscarriages  . TUBAL LIGATION Bilateral 08/17/2012   Procedure: POST PARTUM TUBAL LIGATION;  Surgeon: Allie BossierMyra C Dove, MD;  Location: [facility];  Service: Gynecology;  Laterality: Bilateral;     OB History    Gravida  5   Para  2   Term  2   Preterm  0   AB  3   Living  2     SAB  3   TAB  0   Ectopic  0   Multiple  0   Live Births  2            Home Medications    Prior to Admission medications   Medication Sig Start Date End Date Taking? Authorizing Provider  albuterol (VENTOLIN HFA) 108 (90 Base) MCG/ACT inhaler Inhale 2 puffs into the lungs every 4 (four) hours as needed for wheezing or shortness of breath (cough, shortness of breath or wheezing.). 11/13/18   Kallie LocksStroud, Natalie M, FNP  buPROPion [facility](WELLBUTRIN SR)  150 MG 12 hr tablet Take 1 tablet (150 mg total) by mouth 2 (two) times daily. 02/03/19 04/04/19  Kallie Locks, FNP  carvedilol (COREG) 25 MG tablet Take 1 tablet (25 mg total) by mouth 2 (two) times daily with a meal. 02/03/19 04/04/19  Kallie Locks, FNP  cholecalciferol (VITAMIN D) 1000 units tablet Take 1 tablet (1,000 Units total) by mouth daily. 04/16/18   Kallie Locks, FNP  cyclobenzaprine (FLEXERIL) 10 MG tablet Take 1 tablet (10 mg total) by mouth 2 (two) times daily as needed for muscle spasms. 12/24/18   Kallie Locks, FNP  furosemide (LASIX) 40 MG tablet Take 1 tablet (40 mg total) by mouth daily. 11/13/18 01/12/19  Kallie Locks, FNP  gabapentin (NEURONTIN) 300 MG capsule  Take 1 capsule (300 mg total) by mouth 3 (three) times daily. 11/13/18 01/12/19  Kallie Locks, FNP  hydrALAZINE (APRESOLINE) 25 MG tablet Take 1 tablet (25 mg total) by mouth 3 (three) times daily. 02/03/19 04/04/19  Kallie Locks, FNP  ibuprofen (ADVIL) 800 MG tablet Take 1 tablet (800 mg total) by mouth every 8 (eight) hours as needed. 11/13/18   Kallie Locks, FNP  nitroGLYCERIN (NITROSTAT) 0.3 MG SL tablet Place 1 tablet (0.3 mg total) under the tongue every 5 (five) minutes as needed for chest pain. 11/14/18   Kallie Locks, FNP  RESTASIS 0.05 % ophthalmic emulsion Place 1 drop into both eyes as directed. 11/13/18   Kallie Locks, FNP  sacubitril-valsartan (ENTRESTO) 97-103 MG Take 1 tablet by mouth 2 (two) times daily. 02/03/19   Kallie Locks, FNP  spironolactone (ALDACTONE) 25 MG tablet Take 1 tablet (25 mg total) by mouth daily. 02/03/19 04/04/19  Kallie Locks, FNP  sulfamethoxazole-trimethoprim (BACTRIM DS) 800-160 MG tablet Take 1 tablet by mouth 2 (two) times daily. 12/17/18   Kallie Locks, FNP    Family History Family History  Problem Relation Age of Onset  . Hypertension Mother   . Hypertension Maternal Grandmother   . Hypertension Maternal Grandfather   . Other Neg Hx     Social History Social History   Tobacco Use  . Smoking status: Current Every Day Smoker    Packs/day: 0.30    Years: 10.00    Pack years: 3.00    Types: Cigarettes  . Smokeless tobacco: Never Used  Substance Use Topics  . 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 Drug use: No     Allergies   Patient has no known allergies.   Review of Systems Review of Systems  Constitutional: Negative for appetite change, chills, fatigue and fever.  HENT: Negative for congestion, rhinorrhea and sinus pain.   Eyes: Negative for photophobia and visual disturbance.  Respiratory: Negative for cough, chest tightness and shortness of breath.   Cardiovascular:  Positive for chest pain. Negative for palpitations and leg swelling.  Gastrointestinal: Positive for abdominal pain. Negative for diarrhea, nausea, rectal pain and vomiting.  Genitourinary: Negative for dysuria and hematuria.  Musculoskeletal: Positive for myalgias. Negative for arthralgias, back pain, gait problem and joint swelling.  Skin: Negative for rash and wound.  Neurological: Positive for dizziness, light-headedness and headaches. Negative for syncope and weakness.  Psychiatric/Behavioral: Negative for confusion.     Physical Exam Updated Vital Signs BP 107/66   Pulse 72   Temp 98.4 F (36.9 C) (Oral)   Resp 20   LMP 03/08/2019 (Approximate)   SpO2  98%   Physical Exam Vitals signs and nursing note reviewed.  Constitutional:      Appearance: She is well-developed. She is not ill-appearing, toxic-appearing or diaphoretic.     Comments: Patient is tearful, anxious  HENT:     Head: Normocephalic and atraumatic.  Eyes:     Pupils: Pupils are equal, round, and reactive to light.  Neck:     Musculoskeletal: Normal range of motion.  Cardiovascular:     Rate and Rhythm: Normal rate and regular rhythm.     Heart sounds: Normal heart sounds.  Pulmonary:     Effort: Pulmonary effort is normal. No tachypnea, accessory muscle usage or respiratory distress.     Breath sounds: Normal breath sounds. No stridor. No decreased breath sounds.  Chest:     Chest wall: Tenderness (Tender to palpation of the sternum) present. No mass.  Abdominal:     General: Bowel sounds are normal.     Palpations: Abdomen is soft. There is no fluid wave.     Tenderness: There is abdominal tenderness (Epigastric tenderness). There is no guarding or rebound.  Musculoskeletal:     Right lower leg: No edema.     Left lower leg: No edema.  Skin:    General: Skin is warm.     Capillary Refill: Capillary refill takes less than 2 seconds.     Findings: No erythema or rash.  Neurological:     Mental  Status: She is alert and oriented to person, place, and time.      ED Treatments / Results  Labs (all labs ordered are listed, but only abnormal results are displayed) Labs Reviewed  BASIC METABOLIC PANEL - Abnormal; Notable for the following components:      Result Value   CO2 18 (*)    Creatinine, Ser 1.02 (*)    Calcium 8.7 (*)    All other components within normal limits  URINALYSIS, ROUTINE W REFLEX MICROSCOPIC - Abnormal; Notable for the following components:   Color, Urine STRAW (*)    Specific Gravity, Urine 1.003 (*)    Leukocytes,Ua SMALL (*)    All other components within normal limits  RAPID URINE DRUG SCREEN, HOSP PERFORMED - Abnormal; Notable for the following components:   Tetrahydrocannabinol POSITIVE (*)    All other components within normal limits  HEPATIC FUNCTION PANEL - Abnormal; Notable for the following components:   AST 49 (*)    ALT 47 (*)    All other components within normal limits  CBC  PREGNANCY, URINE  LIPASE, BLOOD  TROPONIN I (HIGH SENSITIVITY)  TROPONIN I (HIGH SENSITIVITY)    EKG EKG Interpretation  Date/Time:  Wednesday April 02 2019 13:34:11 EDT Ventricular Rate:  79 PR Interval:    QRS Duration: 96 QT Interval:  400 QTC Calculation: 459 R Axis:   8 Text Interpretation:  Sinus rhythm ST elev, probable normal early repol pattern Confirmed by Benjiman Core [phone]) on 04/02/2019 4:01:36 PM   Radiology Dg Chest Port 1 View  Result Date: 04/02/2019 CLINICAL DATA:  Chest pain, V-tach EXAM: PORTABLE CHEST 1 VIEW COMPARISON:  None. FINDINGS: The heart size and mediastinal contours are within normal limits. Both lungs are clear. The visualized skeletal structures are unremarkable. IMPRESSION: No active disease. Electronically Signed   By: Donzetta Kohut M.D.   On: 04/02/2019 14:04    Procedures Procedures (including critical care time)  Medications Ordered in ED Medications  LORazepam (ATIVAN) injection 1 mg (1 mg Intravenous  Given 04/02/19  1412)  sodium chloride 0.9 % bolus 1,000 mL (0 mLs Intravenous Stopped 04/02/19 1602)  ketorolac (TORADOL) 15 MG/ML injection 15 mg (15 mg Intravenous Given 04/02/19 1410)  morphine 4 MG/ML injection 4 mg (4 mg Intravenous Given 04/02/19 1508)  alum & mag hydroxide-simeth (MAALOX/MYLANTA) 200-200-20 MG/5ML suspension 30 mL (30 mLs Oral Given 04/02/19 1516)    And  lidocaine (XYLOCAINE) 2 % viscous mouth solution 15 mL (15 mLs Oral Given 04/02/19 1516)     Initial Impression / Assessment and Plan / ED Course  I have reviewed the triage vital signs and the nursing notes.  Pertinent labs & imaging results that were available during my care of the patient were reviewed by me and considered in my medical decision making (see chart for details).  Clinical Course as of Apr 01 1602  Wed Apr 02, 2019  1456 43 y/o hx alcohol use with reproducible chest pain and epigastric pain with palpation, leg cramps and headache since last night. No fever, or other URI symptoms. She attributes this pain to her carvedilol. Patient improved with Toradol and ativan but still complains of pain. +THC. Admits alcohol nightly. Initial trop is 5. AST ALT mildly elevated to 49,47 I am going to order RUQ ultrasound to r/o gallbladder pathology. Suspect alcohol induced gastritis. Heart score of 3 and low suspicion for ACS, will repeat trop. If RUQ u/s negative and trop unchanged then pend discharge.   [KM]  4166 Patient sign out to [employer] PA-C due to change of shift   [KM]    Clinical Course User Index [KM] Alveria Apley, PA-C         Final Clinical Impressions(s) / ED Diagnoses   Final diagnoses:  RUQ pain  Epigastric pain    ED Discharge Orders    None       Kristine Royal 04/02/19 1603    Davonna Belling, MD 04/02/19 1606

## 2019-04-02 NOTE — ED Provider Notes (Signed)
Care assumed from [facility], PA-C, at shift change, please see their notes for full documentation of patient's complaint/HPI. Briefly, pt here with reproducible chest pain and epigastric pain x 2 days; hx of nightly alcohol usage. Results so far show mild elevation in AST and ALT. Awaiting RUQ ultrasound given elevated LFTs and repeat troponin. Initial troponin of 5. Heart score of 3. Plan is to discharge home if negative.   Physical Exam  BP 107/66   Pulse 72   Temp 98.4 F (36.9 C) (Oral)   Resp 20   LMP 03/08/2019 (Approximate)   SpO2 98%   Physical Exam  ED Course/Procedures   Clinical Course as of Apr 01 1640  Wed Apr 02, 2019  1456 43 y/o hx alcohol use with reproducible chest pain and epigastric pain with palpation, leg cramps and headache since last night. No fever, or other URI symptoms. She attributes this pain to her carvedilol. Patient improved with Toradol and ativan but still complains of pain. +THC. Admits alcohol nightly. Initial trop is 5. AST ALT mildly elevated to 49,47 I am going to order RUQ ultrasound to r/o gallbladder pathology. Suspect alcohol induced gastritis. Heart score of 3 and low suspicion for ACS, will repeat trop. If RUQ u/s negative and trop unchanged then pend discharge.   [KM]  2951 Patient sign out to [employer] PA-C due to change of shift   [KM]    Clinical Course User Index [KM] Alveria Apley, PA-C    Procedures  MDM  Repeat troponin unchanged at 5. RUQ ultrasound negative as well. Upon reevaluation pt still complaining of leg cramping; reports this has been an ongoing issue for sometime - her PCP has prescribed her muscle relaxers in the past. No electrolyte abnormalities today to count for her muscle cramps. Her creatinine is mildly elevated likely due to dehydration; has already received fluids. Pt's leg cramps likely due to alcohol use/dehydration. Encouraged cessation of alcohol usage - have given resources for outpatient counseling.  Encouraged increased PO intake as well. Prescribed pepcid for epigastric pain. Pt to follow up with PCP. Strict return precautions have been discussed. She is in agreement with plan at this time and stable for discharge home.       Eustaquio Maize, PA-C 04/02/19 Jamal Maes    Gareth Morgan, MD 04/03/19 1240

## 2019-05-04 ENCOUNTER — Other Ambulatory Visit: Payer: Self-pay | Admitting: Family Medicine

## 2019-05-14 ENCOUNTER — Other Ambulatory Visit: Payer: Self-pay | Admitting: Family Medicine

## 2019-05-14 MED ORDER — BUDESONIDE-FORMOTEROL FUMARATE 80-4.5 MCG/ACT IN AERO: 2.0000 | INHALATION_SPRAY | Freq: Two times a day (BID) | RESPIRATORY_TRACT | 6 refills | Status: DC

## 2019-06-18 ENCOUNTER — Ambulatory Visit: Payer: Medicaid Other | Admitting: Family Medicine

## 2019-07-01 ENCOUNTER — Ambulatory Visit: Payer: Medicaid Other | Attending: Internal Medicine

## 2019-07-01 DIAGNOSIS — Z20822 Contact with and (suspected) exposure to covid-19: Secondary | ICD-10-CM

## 2019-07-02 LAB — NOVEL CORONAVIRUS, NAA: SARS-CoV-2, NAA: NOT DETECTED

## 2019-07-04 DIAGNOSIS — E559 Vitamin D deficiency, unspecified: Secondary | ICD-10-CM

## 2019-07-04 HISTORY — DX: Vitamin D deficiency, unspecified: E55.9

## 2019-07-21 ENCOUNTER — Encounter: Payer: Self-pay | Admitting: Family Medicine

## 2019-07-21 ENCOUNTER — Other Ambulatory Visit: Payer: Self-pay

## 2019-07-21 ENCOUNTER — Ambulatory Visit (INDEPENDENT_AMBULATORY_CARE_PROVIDER_SITE_OTHER): Payer: Medicaid Other | Admitting: Family Medicine

## 2019-07-21 VITALS — BP 142/96 | HR 81 | Temp 98.3°F | Ht 64.0 in | Wt 118.2 lb

## 2019-07-21 DIAGNOSIS — R252 Cramp and spasm: Secondary | ICD-10-CM | POA: Diagnosis not present

## 2019-07-21 DIAGNOSIS — Z09 Encounter for follow-up examination after completed treatment for conditions other than malignant neoplasm: Secondary | ICD-10-CM

## 2019-07-21 DIAGNOSIS — S66911A Strain of unspecified muscle, fascia and tendon at wrist and hand level, right hand, initial encounter: Secondary | ICD-10-CM | POA: Diagnosis not present

## 2019-07-21 DIAGNOSIS — Z0000 Encounter for general adult medical examination without abnormal findings: Secondary | ICD-10-CM | POA: Diagnosis not present

## 2019-07-21 DIAGNOSIS — R002 Palpitations: Secondary | ICD-10-CM

## 2019-07-21 DIAGNOSIS — N3000 Acute cystitis without hematuria: Secondary | ICD-10-CM

## 2019-07-21 DIAGNOSIS — I10 Essential (primary) hypertension: Secondary | ICD-10-CM | POA: Diagnosis not present

## 2019-07-21 DIAGNOSIS — R319 Hematuria, unspecified: Secondary | ICD-10-CM

## 2019-07-21 DIAGNOSIS — R8290 Unspecified abnormal findings in urine: Secondary | ICD-10-CM

## 2019-07-21 DIAGNOSIS — N390 Urinary tract infection, site not specified: Secondary | ICD-10-CM

## 2019-07-21 LAB — POCT URINALYSIS DIPSTICK
Bilirubin, UA: NEGATIVE
Glucose, UA: NEGATIVE
Ketones, UA: 15
Nitrite, UA: NEGATIVE
Protein, UA: NEGATIVE
Spec Grav, UA: 1.015 (ref 1.010–1.025)
Urobilinogen, UA: 0.2 E.U./dL
pH, UA: 5.5 (ref 5.0–8.0)

## 2019-07-21 MED ORDER — CARVEDILOL 25 MG PO TABS: 25.0000 mg | ORAL_TABLET | Freq: Two times a day (BID) | ORAL | 6 refills | Status: DC

## 2019-07-21 MED ORDER — BUPROPION HCL ER (SR) 150 MG PO TB12: 150.0000 mg | ORAL_TABLET | Freq: Two times a day (BID) | ORAL | 6 refills | Status: DC

## 2019-07-21 MED ORDER — CYCLOBENZAPRINE HCL 10 MG PO TABS: 10.0000 mg | ORAL_TABLET | Freq: Two times a day (BID) | ORAL | 3 refills | Status: DC | PRN

## 2019-07-21 MED ORDER — IBUPROFEN 800 MG PO TABS: 800.0000 mg | ORAL_TABLET | Freq: Three times a day (TID) | ORAL | 2 refills | Status: AC | PRN

## 2019-07-21 MED ORDER — HYDRALAZINE HCL 25 MG PO TABS: 25.0000 mg | ORAL_TABLET | Freq: Three times a day (TID) | ORAL | 6 refills | Status: DC

## 2019-07-21 MED ORDER — SULFAMETHOXAZOLE-TRIMETHOPRIM 800-160 MG PO TABS: 1.0000 | ORAL_TABLET | Freq: Two times a day (BID) | ORAL | 0 refills | Status: DC

## 2019-07-21 MED ORDER — FAMOTIDINE 20 MG PO TABS: 20.0000 mg | ORAL_TABLET | Freq: Two times a day (BID) | ORAL | 6 refills | Status: DC

## 2019-07-21 NOTE — Addendum Note (Signed)
Addended by: Kallie Locks on: 07/21/2019 01:24 PM   Modules accepted: Orders

## 2019-07-21 NOTE — Progress Notes (Addendum)
[facility]   Established Patient Office Visit  Subjective:  Patient ID: [name], female    DOB: [birth year]  Age: 44 y.o. MRN: [id number]  CC:  Chief Complaint  Patient presents with  . Follow-up    6 mth follow up    HPI [name] is a 18 presents for Follow up today.   Past Medical History:  Diagnosis Date  . Anemia   . CHF (congestive heart failure) ([place])   . Common migraine with intractable migraine 01/25/2017  . GERD (gastroesophageal reflux disease)   . Hypertension   . Miscarriage    x3  . Sickle cell trait ([place])    Current Status: Since her last office visit, she has c/o achey bones X 2 weeks and bilateral leg pain. She has not been taking Vitamin Rx. She is currently on her menstrual period today. She denies visual changes, chest pain, cough, shortness of breath, heart palpitations, and falls. She has occasional headaches and dizziness with position changes. Denies severe headaches, confusion, seizures, double vision, and blurred vision, nausea and vomiting. Her anxiety is mild today. She denies suicidal ideations, homicidal ideations, or auditory hallucinations. She denies fevers, chills, fatigue, recent infections, weight loss, and night sweats.  No reports of GI problems such as nausea, vomiting, diarrhea, and constipation. She has no reports of blood in stools, dysuria and hematuria.   Past Surgical History:  Procedure Laterality Date  . BREAST SURGERY     cyst removal 1990  . DILATION AND CURETTAGE OF UTERUS     four miscarriages  . TUBAL LIGATION Bilateral 08/17/2012   Procedure: POST PARTUM TUBAL LIGATION;  Surgeon: Emily Filbert, MD;  Location: [facility];  Service: Gynecology;  Laterality: Bilateral;    Family History  Problem Relation Age of Onset  . Hypertension Mother   . Hypertension Maternal Grandmother   . Hypertension Maternal Grandfather   . Other Neg Hx     Social History    Socioeconomic History  . Marital status: Single    Spouse name: Not on file  . Number of children: 2  . Years of education: 13  . Highest education level: Not on file  Occupational History  . Occupation: Unemployed  Tobacco Use  . Smoking status: Current Every Day Smoker    Packs/day: 0.30    Years: 10.00    Pack years: 3.00    Types: Cigarettes  . Smokeless tobacco: Never Used  Substance and Sexual Activity  . 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 Drug use: No  . Sexual activity: Yes    Birth control/protection: None  Other Topics Concern  . Not on file  Social History Narrative   Lives with mother, Mardene Celeste   Caffeine use: Tea daily   Left handed   Social Determinants of Health   Financial Resource Strain:   . Difficulty of Paying Living Expenses: Not on file  Food Insecurity:   . Worried About Charity fundraiser in the Last Year: Not on file  . Ran Out of Food in the Last Year: Not on file  Transportation Needs:   . Lack of Transportation (Medical): Not on file  . Lack of Transportation (Non-Medical): Not on file  Physical Activity:   . Days of Exercise per Week: Not on file  . Minutes of Exercise per Session: Not on file  Stress:   . Feeling  of Stress : Not on file  Social Connections:   . Frequency of Communication with Friends and Family: Not on file  . Frequency of Social Gatherings with Friends and Family: Not on file  . Attends Religious Services: Not on file  . Active Member of Clubs or Organizations: Not on file  . Attends Banker Meetings: Not on file  . Marital Status: Not on file  Intimate Partner Violence:   . Fear of Current or Ex-Partner: Not on file  . Emotionally Abused: Not on file  . Physically Abused: Not on file  . Sexually Abused: Not on file    Outpatient Medications Prior to Visit  Medication Sig Dispense Refill  . albuterol (VENTOLIN HFA) 108 (90 Base) MCG/ACT inhaler Inhale 2  puffs into the lungs every 4 (four) hours as needed for wheezing or shortness of breath (cough, shortness of breath or wheezing.). 1 Inhaler 12  . RESTASIS 0.05 % ophthalmic emulsion Place 1 drop into both eyes as directed. 0.4 mL 3  . sacubitril-valsartan (ENTRESTO) 97-103 MG Take 1 tablet by mouth 2 (two) times daily. 180 tablet 2  . famotidine (PEPCID) 20 MG tablet Take 1 tablet (20 mg total) by mouth 2 (two) times daily. 30 tablet 0  . hydrALAZINE (APRESOLINE) 25 MG tablet Take 1 tablet (25 mg total) by mouth 3 (three) times daily. 90 tablet 1  . ibuprofen (ADVIL) 800 MG tablet Take 1 tablet (800 mg total) by mouth every 8 (eight) hours as needed. 30 tablet 2  . budesonide-formoterol (SYMBICORT) 80-4.5 MCG/ACT inhaler Inhale 2 puffs into the lungs 2 (two) times daily. (Patient not taking: Reported on 07/21/2019) 1 Inhaler 6  . cholecalciferol (VITAMIN D) 1000 units tablet Take 1 tablet (1,000 Units total) by mouth daily. (Patient not taking: Reported on 07/21/2019) 30 tablet 4  . gabapentin (NEURONTIN) 300 MG capsule Take 1 capsule (300 mg total) by mouth 3 (three) times daily. (Patient not taking: Reported on 07/21/2019) 90 capsule 1  . nitroGLYCERIN (NITROSTAT) 0.3 MG SL tablet Place 1 tablet (0.3 mg total) under the tongue every 5 (five) minutes as needed for chest pain. (Patient not taking: Reported on 07/21/2019) 90 tablet 3  . buPROPion (WELLBUTRIN SR) 150 MG 12 hr tablet TAKE 1 TABLET BY MOUTH TWICE A DAY (Patient not taking: Reported on 07/21/2019) 60 tablet 1  . carvedilol (COREG) 25 MG tablet Take 1 tablet (25 mg total) by mouth 2 (two) times daily with a meal. (Patient not taking: Reported on 07/21/2019) 60 tablet 1  . cyclobenzaprine (FLEXERIL) 10 MG tablet Take 1 tablet (10 mg total) by mouth 2 (two) times daily as needed for muscle spasms. (Patient not taking: Reported on 07/21/2019) 20 tablet 0  . furosemide (LASIX) 40 MG tablet Take 1 tablet (40 mg total) by mouth daily. 30 tablet 1  .  spironolactone (ALDACTONE) 25 MG tablet TAKE 1 TABLET BY MOUTH EVERY DAY 30 tablet 1  . sulfamethoxazole-trimethoprim (BACTRIM DS) 800-160 MG tablet Take 1 tablet by mouth 2 (two) times daily. 14 tablet 0   No facility-administered medications prior to visit.    No Known Allergies  ROS Review of Systems  Constitutional: Negative.   HENT: Negative.   Eyes: Negative.   Respiratory: Negative.   Cardiovascular: Negative.   Gastrointestinal: Negative.   Genitourinary: Negative.   Musculoskeletal: Positive for arthralgias (bilateral leg pain).  Skin: Negative.   Allergic/Immunologic: Negative.   Neurological: Positive for dizziness (occasional ) and headaches (occasional).  Hematological:  Negative.   Psychiatric/Behavioral: Negative.    Objective:    Physical Exam  Constitutional: She is oriented to person, place, and time. She appears well-developed and well-nourished.  HENT:  Head: Normocephalic and atraumatic.  Eyes: Conjunctivae are normal.  Cardiovascular: Normal rate, regular rhythm, normal heart sounds and intact distal pulses.  Pulmonary/Chest: Effort normal and breath sounds normal.  Abdominal: Soft. Bowel sounds are normal.  Musculoskeletal:        General: Normal range of motion.     Cervical back: Normal range of motion and neck supple.  Neurological: She is alert and oriented to person, place, and time.  Skin: Skin is warm and dry.  Nursing note and vitals reviewed.   BP (!) 142/96   Pulse 81   Temp 98.3 F (36.8 C) (Oral)   Ht 5\' 4"  (1.626 m)   Wt 118 lb 3.2 oz (53.6 kg)   SpO2 98%   BMI 20.29 kg/m  Wt Readings from Last 3 Encounters:  07/21/19 118 lb 3.2 oz (53.6 kg)  12/17/18 122 lb (55.3 kg)  11/28/18 119 lb (54 kg)     Health Maintenance Due  Topic Date Due  . PAP SMEAR-Modifier  01/06/1999  . INFLUENZA VACCINE  02/01/2019    There are no preventive care reminders to display for this patient.  Lab Results  Component Value Date   TSH  2.890 11/13/2018   Lab Results  Component Value Date   WBC 8.2 04/02/2019   HGB 12.6 04/02/2019   HCT 37.3 04/02/2019   MCV 89.0 04/02/2019   PLT 227 04/02/2019   Lab Results  Component Value Date   NA 138 04/02/2019   K 3.8 04/02/2019   CO2 18 (L) 04/02/2019   GLUCOSE 90 04/02/2019   BUN 13 04/02/2019   CREATININE 1.02 (H) 04/02/2019   BILITOT 0.4 04/02/2019   ALKPHOS 60 04/02/2019   AST 49 (H) 04/02/2019   ALT 47 (H) 04/02/2019   PROT 6.6 04/02/2019   ALBUMIN 3.5 04/02/2019   CALCIUM 8.7 (L) 04/02/2019   ANIONGAP 12 04/02/2019   Lab Results  Component Value Date   CHOL 247 (H) 11/13/2018   Lab Results  Component Value Date   HDL 91 11/13/2018   Lab Results  Component Value Date   LDLCALC 108 (H) 11/13/2018   Lab Results  Component Value Date   TRIG 239 (H) 11/13/2018   Lab Results  Component Value Date   CHOLHDL 2.7 11/13/2018   Lab Results  Component Value Date   HGBA1C 4.9 11/13/2018      Assessment & Plan:   1. Essential hypertension Blood pressure is stable today. She has not refilled all of her medications. Refills sent to pharmacy today. She will began taking medications as prescribed. She will continue to take medications as prescribed, to decrease high sodium intake, excessive alcohol intake, increase potassium intake, smoking cessation, and increase physical activity of at least 30 minutes of cardio activity daily. She will continue to follow Heart Healthy or DASH diet.  2. Bilateral leg cramps - Potassium - Vitamin D, 25-hydroxy - Vitamin B12  3. Heart palpitations - Potassium  4. Wrist strain, right - ibuprofen (ADVIL) 800 MG tablet; Take 1 tablet (800 mg total) by mouth every 8 (eight) hours as needed.  Dispense: 30 tablet; Refill: 2  5. Acute cystitis without hematuria We will initiate Bactrim today.  - sulfamethoxazole-trimethoprim (BACTRIM DS) 800-160 MG tablet; Take 1 tablet by mouth 2 (two) times daily.  Dispense:  14 tablet;  Refill: 0  6. Abnormal urinalysis  Results of urine culture are pending.  - POCT urinalysis dipstick  7. Health care maintenance  8. Follow up She will follow up in 6 months.   Meds ordered this encounter  Medications  . sulfamethoxazole-trimethoprim (BACTRIM DS) 800-160 MG tablet    Sig: Take 1 tablet by mouth 2 (two) times daily.    Dispense:  14 tablet    Refill:  0  . famotidine (PEPCID) 20 MG tablet    Sig: Take 1 tablet (20 mg total) by mouth 2 (two) times daily.    Dispense:  60 tablet    Refill:  6  . hydrALAZINE (APRESOLINE) 25 MG tablet    Sig: Take 1 tablet (25 mg total) by mouth 3 (three) times daily.    Dispense:  90 tablet    Refill:  6  . ibuprofen (ADVIL) 800 MG tablet    Sig: Take 1 tablet (800 mg total) by mouth every 8 (eight) hours as needed.    Dispense:  30 tablet    Refill:  2  . buPROPion (WELLBUTRIN SR) 150 MG 12 hr tablet    Sig: Take 1 tablet (150 mg total) by mouth 2 (two) times daily.    Dispense:  60 tablet    Refill:  6  . carvedilol (COREG) 25 MG tablet    Sig: Take 1 tablet (25 mg total) by mouth 2 (two) times daily with a meal.    Dispense:  60 tablet    Refill:  6  . cyclobenzaprine (FLEXERIL) 10 MG tablet    Sig: Take 1 tablet (10 mg total) by mouth 2 (two) times daily as needed for muscle spasms.    Dispense:  30 tablet    Refill:  3    Orders Placed This Encounter  Procedures  . Potassium  . Vitamin D, 25-hydroxy  . Vitamin B12  . POCT urinalysis dipstick    Referral Orders  No referral(s) requested today    Raliegh Ip,  MSN, FNP-BC [facility] [address] [place], [place] [address] [phone] fax   Problem List Items Addressed This Visit      Cardiovascular and Mediastinum   Hypertension - Primary   Relevant Medications   hydrALAZINE (APRESOLINE) 25 MG tablet   carvedilol (COREG) 25 MG tablet     Musculoskeletal and  Integument   Wrist strain, right, initial encounter   Relevant Medications   ibuprofen (ADVIL) 800 MG tablet     Other   Bilateral leg cramps   Relevant Orders   Potassium   Vitamin D, 25-hydroxy   Vitamin B12    Other Visit Diagnoses    Heart palpitations       Relevant Orders   Potassium   Acute cystitis without hematuria       Relevant Medications   sulfamethoxazole-trimethoprim (BACTRIM DS) 800-160 MG tablet   Health care maintenance       Relevant Orders   POCT urinalysis dipstick (Completed)   Follow up          Meds ordered this encounter  Medications  . sulfamethoxazole-trimethoprim (BACTRIM DS) 800-160 MG tablet    Sig: Take 1 tablet by mouth 2 (two) times daily.    Dispense:  14 tablet    Refill:  0  . famotidine (PEPCID) 20 MG tablet    Sig: Take 1 tablet (20 mg total) by mouth  2 (two) times daily.    Dispense:  60 tablet    Refill:  6  . hydrALAZINE (APRESOLINE) 25 MG tablet    Sig: Take 1 tablet (25 mg total) by mouth 3 (three) times daily.    Dispense:  90 tablet    Refill:  6  . ibuprofen (ADVIL) 800 MG tablet    Sig: Take 1 tablet (800 mg total) by mouth every 8 (eight) hours as needed.    Dispense:  30 tablet    Refill:  2  . buPROPion (WELLBUTRIN SR) 150 MG 12 hr tablet    Sig: Take 1 tablet (150 mg total) by mouth 2 (two) times daily.    Dispense:  60 tablet    Refill:  6  . carvedilol (COREG) 25 MG tablet    Sig: Take 1 tablet (25 mg total) by mouth 2 (two) times daily with a meal.    Dispense:  60 tablet    Refill:  6  . cyclobenzaprine (FLEXERIL) 10 MG tablet    Sig: Take 1 tablet (10 mg total) by mouth 2 (two) times daily as needed for muscle spasms.    Dispense:  30 tablet    Refill:  3    Follow-up: Return in about 6 months (around 01/18/2020).    Kallie LocksNatalie M Jayci Ellefson, FNP

## 2019-07-22 LAB — VITAMIN D 25 HYDROXY (VIT D DEFICIENCY, FRACTURES): Vit D, 25-Hydroxy: 12.1 ng/mL — ABNORMAL LOW (ref 30.0–100.0)

## 2019-07-22 LAB — VITAMIN B12: Vitamin B-12: 376 pg/mL (ref 232–1245)

## 2019-07-22 LAB — POTASSIUM: Potassium: 4.2 mmol/L (ref 3.5–5.2)

## 2019-07-23 LAB — URINE CULTURE

## 2019-07-24 ENCOUNTER — Encounter: Payer: Self-pay | Admitting: Family Medicine

## 2019-07-24 ENCOUNTER — Other Ambulatory Visit: Payer: Self-pay | Admitting: Family Medicine

## 2019-07-24 DIAGNOSIS — E559 Vitamin D deficiency, unspecified: Secondary | ICD-10-CM

## 2019-07-24 MED ORDER — VITAMIN D (ERGOCALCIFEROL) 1.25 MG (50000 UNIT) PO CAPS: 50000.0000 [IU] | ORAL_CAPSULE | ORAL | 6 refills | Status: DC

## 2019-08-02 ENCOUNTER — Other Ambulatory Visit: Payer: Self-pay | Admitting: Internal Medicine

## 2019-08-30 NOTE — Progress Notes (Signed)
Telehealth Visit     Virtual Visit via Video Note   This visit type was conducted due to national recommendations for restrictions regarding the COVID-19 Pandemic (e.g. social distancing) in an effort to limit this patient's exposure and mitigate transmission in our community.  Due to her co-morbid illnesses, this patient is at least at moderate risk for complications without adequate follow up.  This format is felt to be most appropriate for this patient at this time.  All issues noted in this document were discussed and addressed.  A limited physical exam was performed with this format.  Please refer to the patient's chart for her consent to telehealth for [facility].   Evaluation Performed:  Follow-up visit  This visit type was conducted due to national recommendations for restrictions regarding the COVID-19 Pandemic (e.g. social distancing).  This format is felt to be most appropriate for this patient at this time.  All issues noted in this document were discussed and addressed.  No physical exam was performed (except for noted visual exam findings with Video Visits).  Please refer to the patient's chart (MyChart message for video visits and phone note for telephone visits) for the patient's consent to telehealth for [facility].  Date:  09/02/2019   ID:  Para March, DOB 1975/08/21, MRN [id number]  Patient Location:  Work  Provider location:   Home  PCP:  Azzie Glatter, FNP  Cardiologist:   Jenkins Rouge, MD  Electrophysiologist:  None   Chief Complaint:  Follow up visit.   History of Present Illness:    [name] is a 44 y.o. female who presents via audio/video conferencing for a telehealth visit today.  Seen for Dr. Johnsie Cancel.   She has a history of HTN, sickle cell trait, tobacco abuse, GERD, dilated cardiomyopathyand medical non-compliance.   In 2018, she was admitted with chest pain and found to be markedly hypertensive secondary to noncompliance. An  echocardiogram showed and LVEF of 35-40%,mild LVH, G2DD, mild RV dysfunction. 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 She was started on Entresto however she runs out of her medications and is not always compliant with therapies.  Last seen by Dr. Johnsie Cancel in September of 2019 - had some atpyical chest pain - was referred for stress testing - low risk with no ischemia - EF of 48% but visually estimated at 55%.   She had a telehealth visit with Lyda Jester, PA back in May of 2020 following an ER visit for chest pain - BP again severely elevated - she had her medicines restarted but did leave AMA.  No BP monitor at home.   The patient does not have symptoms concerning for COVID-19 infection (fever, chills, cough, or new shortness of breath).   Seen today by a telephone call. She declined video. She is at work at [employer]. She has consented for this visit. Not able to get a BP or HR for [place]. She reports some swelling in her legs and her hands. Little short of breath - but not bad per her account but a little more pronounced. She is trying to watch her salt and not eat the food where she works. Says her Medicaid is paying for her medicines now - so cost is not an issue. No chest pain. Not dizzy.  Has not been sick. No recent labs. No echo since 2018.    Past Medical History:  Diagnosis Date  . Anemia   . CHF (congestive heart failure) ([place])   .  Common migraine with intractable migraine 01/25/2017  . GERD (gastroesophageal reflux disease)   . Hypertension   . Miscarriage    x3  . Sickle cell trait (H[facility])   . Vitamin D deficiency 07/2019   Past Surgical History:  Procedure Laterality Date  . BREAST SURGERY     cyst removal 1990  . DILATION AND CURETTAGE OF UTERUS     four miscarriages  . TUBAL LIGATION Bilateral 08/17/2012   Procedure: POST PARTUM TUBAL LIGATION;  Surgeon: Allie Bossier, MD;  Location: [facility];  Service: Gynecology;  Laterality: Bilateral;     Current Meds    Medication Sig  . albuterol (VENTOLIN HFA) 108 (90 Base) MCG/ACT inhaler Inhale 2 puffs into the lungs every 4 (four) hours as needed for wheezing or shortness of breath (cough, shortness of breath or wheezing.).  Marland Kitchen budesonide-formoterol (SYMBICORT) 80-4.5 MCG/ACT inhaler Inhale 2 puffs into the lungs 2 (two) times daily.  Marland Kitchen buPROPion (WELLBUTRIN SR) 150 MG 12 hr tablet Take 1 tablet (150 mg total) by mouth 2 (two) times daily.  . carvedilol (COREG) 25 MG tablet Take 1 tablet (25 mg total) by mouth 2 (two) times daily with a meal.  . cyclobenzaprine (FLEXERIL) 10 MG tablet Take 1 tablet (10 mg total) by mouth 2 (two) times daily as needed for muscle spasms.  . famotidine (PEPCID) 20 MG tablet Take 1 tablet (20 mg total) by mouth 2 (two) times daily.  Marland Kitchen gabapentin (NEURONTIN) 300 MG capsule Take 1 capsule (300 mg total) by mouth 3 (three) times daily.  . hydrALAZINE (APRESOLINE) 25 MG tablet Take 1 tablet (25 mg total) by mouth 3 (three) times daily.  Marland Kitchen ibuprofen (ADVIL) 800 MG tablet Take 1 tablet (800 mg total) by mouth every 8 (eight) hours as needed.  . nitroGLYCERIN (NITROSTAT) 0.3 MG SL tablet Place 1 tablet (0.3 mg total) under the tongue every 5 (five) minutes as needed for chest pain.  Marland Kitchen RESTASIS 0.05 % ophthalmic emulsion Place 1 drop into both eyes as directed.  . sacubitril-valsartan (ENTRESTO) 97-103 MG Take 1 tablet by mouth 2 (two) times daily.  . Vitamin D, Ergocalciferol, (DRISDOL) 1.25 MG (50000 UNIT) CAPS capsule Take 1 capsule (50,000 Units total) by mouth every 7 (seven) days.     Allergies:   Patient has no known allergies.   Social History   Tobacco Use  . Smoking status: Current Every Day Smoker    Packs/day: 0.30    Years: 10.00    Pack years: 3.00    Types: Cigarettes  . Smokeless tobacco: Never Used  Substance Use Topics  . 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 Drug use: No     Family Hx: The patient's family history  includes Hypertension in her maternal grandfather, maternal grandmother, and mother. There is no history of Other.  ROS:   Please see the history of present illness.   All other systems reviewed are negative.    Objective:    Vital Signs:  Ht 5\' 4"  (1.626 m)   Wt 119 lb 12.8 oz (54.3 kg)   BMI 20.56 kg/m    Wt Readings from Last 3 Encounters:  09/02/19 119 lb 12.8 oz (54.3 kg)  07/21/19 118 lb 3.2 oz (53.6 kg)  12/17/18 122 lb (55.3 kg)    Alert female in no acute distress.   Labs/Other Tests and Data Reviewed:    Lab Results  Component Value Date  WBC 8.2 04/02/2019   HGB 12.6 04/02/2019   HCT 37.3 04/02/2019   PLT 227 04/02/2019   GLUCOSE 90 04/02/2019   CHOL 247 (H) 11/13/2018   TRIG 239 (H) 11/13/2018   HDL 91 11/13/2018   LDLCALC 108 (H) 11/13/2018   ALT 47 (H) 04/02/2019   AST 49 (H) 04/02/2019   NA 138 04/02/2019   K 4.2 07/21/2019   CL 108 04/02/2019   CREATININE 1.02 (H) 04/02/2019   BUN 13 04/02/2019   CO2 18 (L) 04/02/2019   TSH 2.890 11/13/2018   HGBA1C 4.9 11/13/2018     BNP (last 3 results) Recent Labs    11/09/18 1048  BNP 30.0    ProBNP (last 3 results) No results for input(s): PROBNP in the last 8760 hours.    Prior CV studies:    The following studies were reviewed today:  Echocardiogram: 11/08/16 Study Conclusions  - Left ventricle: The cavity size was normal. Wall thickness was increased in a pattern of mild LVH. Systolic function was moderately reduced. The estimated ejection fraction was in the range of 35% to 40%. Features are consistent with a pseudonormal left ventricular filling pattern, with concomitant abnormal relaxation and increased filling pressure (grade 2 diastolic dysfunction). - Right ventricle: Systolic function was mildly reduced.  Lexiscan stress test 04/05/2018:   Nuclear stress EF is calculated at 48% but visually appears normal at 55%.  There was no ST segment deviation noted during  stress.  There is evidence of apical thinning but no ischemia noted.  This is a low risk study.   ASSESSMENT & PLAN:    1.  HTN - unclear what her BP is doing.   2. Medical non compliance - she reports currently taking everything as recommended.   3. History of chest pain - not endorsed.   4. Chronic systolic HF - in the setting of poorly controlled HTN - she endorses more swelling in her hands and especially her feet - short course of lasix - needs in office assessment with plans for lab and arrange echocardiogram.   5. Prior elevated LFTs - needs repeating.   6. Substance abuse  75. COVID-19 Education: The signs and symptoms of COVID-19 were discussed with the patient and how to seek care for testing (follow up with PCP or arrange E-visit).  The importance of social distancing, staying at home, hand hygiene and wearing a mask when out in public were discussed today.  Patient Risk:   After full review of this patient's clinical status, I feel that they are at least moderate risk at this time.  Time:   Today, I have spent 6 minutes with the patient with telehealth technology discussing the above issues.     Medication Adjustments/Labs and Tests Ordered: Current medicines are reviewed at length with the patient today.  Concerns regarding medicines are outlined above.   Tests Ordered: No orders of the defined types were placed in this encounter.   Medication Changes: Meds ordered this encounter  Medications  . furosemide (LASIX) 20 MG tablet    Sig: Take 1 tablet (20 mg total) by mouth daily.    Dispense:  10 tablet    Refill:  0    Order Specific Question:   Supervising Provider    Answer:   [place], PETER M [[id number]]    Disposition:  FU with [place] in the next week or so for in office visit.    Patient is agreeable to this plan and will call if  any problems develop in the interim.   Avelina Laine, NP  09/02/2019 11:26 AM    [facility]

## 2019-09-02 ENCOUNTER — Telehealth: Payer: Self-pay | Admitting: *Deleted

## 2019-09-02 ENCOUNTER — Telehealth (INDEPENDENT_AMBULATORY_CARE_PROVIDER_SITE_OTHER): Payer: Medicaid Other | Admitting: Nurse Practitioner

## 2019-09-02 ENCOUNTER — Encounter: Payer: Self-pay | Admitting: Nurse Practitioner

## 2019-09-02 ENCOUNTER — Other Ambulatory Visit: Payer: Self-pay

## 2019-09-02 VITALS — Ht 64.0 in | Wt 119.8 lb

## 2019-09-02 DIAGNOSIS — I5032 Chronic diastolic (congestive) heart failure: Secondary | ICD-10-CM | POA: Diagnosis not present

## 2019-09-02 DIAGNOSIS — Z9119 Patient's noncompliance with other medical treatment and regimen: Secondary | ICD-10-CM

## 2019-09-02 DIAGNOSIS — Z7189 Other specified counseling: Secondary | ICD-10-CM

## 2019-09-02 DIAGNOSIS — I110 Hypertensive heart disease with heart failure: Secondary | ICD-10-CM | POA: Diagnosis not present

## 2019-09-02 DIAGNOSIS — Z79899 Other long term (current) drug therapy: Secondary | ICD-10-CM

## 2019-09-02 DIAGNOSIS — I10 Essential (primary) hypertension: Secondary | ICD-10-CM

## 2019-09-02 DIAGNOSIS — F1910 Other psychoactive substance abuse, uncomplicated: Secondary | ICD-10-CM | POA: Diagnosis not present

## 2019-09-02 DIAGNOSIS — I420 Dilated cardiomyopathy: Secondary | ICD-10-CM

## 2019-09-02 DIAGNOSIS — I5022 Chronic systolic (congestive) heart failure: Secondary | ICD-10-CM

## 2019-09-02 DIAGNOSIS — Z720 Tobacco use: Secondary | ICD-10-CM

## 2019-09-02 MED ORDER — FUROSEMIDE 20 MG PO TABS: 20.0000 mg | ORAL_TABLET | Freq: Every day | ORAL | 0 refills | Status: DC

## 2019-09-02 NOTE — Telephone Encounter (Signed)
Calling pt for a VT visit today called both numbers, lvm on cell phone number to call office when I called the home number @ [phone] the pt stated did not know about this appt.  Made appt with Dr. Eden Emms. Confirmed dob and asked to make IP appt with Dr. Eden Emms.  Received phone call from scheduler pt was on phone for VT visit.  Stated just made appt with Dr. Eden Emms for pt.  Put pt through.  Pt stated it was her daughter I t/w and she would not have known about this visit.  Asked if her daughter plays her, pt stated her daughter is 22 years old and not supposed to play her, Stated she did.  Had VT visit.  Lawson Fiscal is aware.

## 2019-09-02 NOTE — Patient Instructions (Addendum)
After Visit Summary:  We will be checking the following labs today - NONE   Medication Instructions:    Continue with your current medicines.   I am going to send you a few days of Lasix to take - just 20 mg for 3 days only. I have sent this to your pharmacy.     If you need a refill on your cardiac medications before your next appointment, please call your pharmacy.     Testing/Procedures To Be Arranged:  N/A  Follow-Up:   We need to see you in the office - get labs and check your blood pressure and arrange for an ultrasound of your heart too.     At [facility], you and your health needs are our priority.  As part of our continuing mission to provide you with exceptional heart care, we have created designated Provider Care Teams.  These Care Teams include your primary Cardiologist (physician) and Advanced Practice Providers (APPs -  Physician Assistants and Nurse Practitioners) who all work together to provide you with the care you need, when you need it.  Special Instructions:  . Stay safe, stay home, wash your hands for at least 20 seconds and wear a mask when out in public.  . It was good to talk with you today.    Call the [facility] office at [phone] if you have any questions, problems or concerns.

## 2019-09-05 NOTE — Progress Notes (Deleted)
CARDIOLOGY OFFICE NOTE  Date:  09/09/2019    Para March Date of Birth: 02/24/1976 Medical Record #[id number]  PCP:  Azzie Glatter, FNP  Cardiologist:  Gillian Shields  No chief complaint on file.   History of Present Illness: [name] is a 44 y.o. female who presents today for a follow up visit. Seen for Dr. Johnsie Cancel.   She has a history of HTN, sickle cell trait, tobacco abuse, GERD, dilated cardiomyopathyand medical non-compliance.  In 2018,she was admitted with chest pain and found to be markedly hypertensive secondary to noncompliance. An echocardiogram showed and LVEF of 35-40%,mild LVH, G2DD, mild RV dysfunction. 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 She was started on Entresto however she runs out of her medications and is not always compliant with therapies.  Last seen by Dr. Johnsie Cancel in September of 2019 - had some atpyical chest pain - was referred for stress testing - low risk with no ischemia - EF of 48% but visually estimated at 55%.   She had a telehealth visit with Lyda Jester, PA back in May of 2020 following an ER visit for chest pain - BP again severely elevated - she had her medicines restarted but did leave AMA.  No BP monitor at home.   I did a telehealth visit with her last week - some swelling noted. Not able to check BP or HR. Little short of breath. Started low dose Lasix and advised to have in office visit. May need echo updated.   The patient {does/does not:200015} have symptoms concerning for COVID-19 infection (fever, chills, cough, or new shortness of breath).   Comes in today. Here with   Past Medical History:  Diagnosis Date  . Anemia   . CHF (congestive heart failure) ([place])   . Common migraine with intractable migraine 01/25/2017  . GERD (gastroesophageal reflux disease)   . Hypertension   . Miscarriage    x3  . Sickle cell trait ([place])   . Vitamin D deficiency 07/2019    Past Surgical  History:  Procedure Laterality Date  . BREAST SURGERY     cyst removal 1990  . DILATION AND CURETTAGE OF UTERUS     four miscarriages  . TUBAL LIGATION Bilateral 08/17/2012   Procedure: POST PARTUM TUBAL LIGATION;  Surgeon: Emily Filbert, MD;  Location: [facility];  Service: Gynecology;  Laterality: Bilateral;     Medications: No outpatient medications have been marked as taking for the 09/12/19 encounter (Appointment) with Burtis Junes, NP.     Allergies: No Known Allergies  Social History: The patient  reports that she has been smoking cigarettes. She has a 3.00 pack-year smoking history. She has never used smokeless tobacco. She reports current alcohol use. She reports that she does not use drugs.   Family History: The patient's ***family history includes Hypertension in her maternal grandfather, maternal grandmother, and mother.   Review of Systems: Please see the history of present illness.   All other systems are reviewed and negative.   Physical Exam: VS:  There were no vitals taken for this visit. Marland Kitchen  BMI There is no height or weight on file to calculate BMI.  Wt Readings from Last 3 Encounters:  09/02/19 119 lb 12.8 oz (54.3 kg)  07/21/19 118 lb 3.2 oz (53.6 kg)  12/17/18 122 lb (55.3 kg)    General: Pleasant. Well developed, well nourished and in no acute distress.   HEENT: Normal.  Neck: Supple,  no JVD, carotid bruits, or masses noted.  Cardiac: ***Regular rate and rhythm. No murmurs, rubs, or gallops. No edema.  Respiratory:  Lungs are clear to auscultation bilaterally with normal work of breathing.  GI: Soft and nontender.  MS: No deformity or atrophy. Gait and ROM intact.  Skin: Warm and dry. Color is normal.  Neuro:  Strength and sensation are intact and no gross focal deficits noted.  Psych: Alert, appropriate and with normal affect.   LABORATORY DATA:  EKG:  EKG {ACTION; IS/IS EHM:[id number]} ordered today. This demonstrates ***.  Lab Results  Component  Value Date   WBC 8.2 04/02/2019   HGB 12.6 04/02/2019   HCT 37.3 04/02/2019   PLT 227 04/02/2019   GLUCOSE 90 04/02/2019   CHOL 247 (H) 11/13/2018   TRIG 239 (H) 11/13/2018   HDL 91 11/13/2018   LDLCALC 108 (H) 11/13/2018   ALT 47 (H) 04/02/2019   AST 49 (H) 04/02/2019   NA 138 04/02/2019   K 4.2 07/21/2019   CL 108 04/02/2019   CREATININE 1.02 (H) 04/02/2019   BUN 13 04/02/2019   CO2 18 (L) 04/02/2019   TSH 2.890 11/13/2018   HGBA1C 4.9 11/13/2018     BNP (last 3 results) Recent Labs    11/09/18 1048  BNP 30.0    ProBNP (last 3 results) No results for input(s): PROBNP in the last 8760 hours.   Other Studies Reviewed Today:  Echocardiogram: 11/08/16 Study Conclusions  - Left ventricle: The cavity size was normal. Wall thickness was increased in a pattern of mild LVH. Systolic function was moderately reduced. The estimated ejection fraction was in the range of 35% to 40%. Features are consistent with a pseudonormal left ventricular filling pattern, with concomitant abnormal relaxation and increased filling pressure (grade 2 diastolic dysfunction). - Right ventricle: Systolic function was mildly reduced.  Lexiscan stress test 04/05/2018:   Nuclear stress EF is calculated at 48% but visually appears normal at 55%.  There was no ST segment deviation noted during stress.  There is evidence of apical thinning but no ischemia noted.  This is a low risk study.   ASSESSMENT & PLAN:    1.  HTN - unclear what her BP is doing.   2. Medical non compliance - she reports currently taking everything as recommended.   3. History of chest pain - not endorsed.   4. Chronic systolic HF - in the setting of poorly controlled HTN - she endorses more swelling in her hands and especially her feet - short course of lasix - needs in office assessment with plans for lab and arrange echocardiogram.   5. Prior elevated LFTs - needs repeating.   6.  Substance abuse  . COVID-19 Education: The signs and symptoms of COVID-19 were discussed with the patient and how to seek care for testing (follow up with PCP or arrange E-visit).  The importance of social distancing, staying at home, hand hygiene and wearing a mask when out in public were discussed today.  Current medicines are reviewed with the patient today.  The patient does not have concerns regarding medicines other than what has been noted above.  The following changes have been made:  See above.  Labs/ tests ordered today include:   No orders of the defined types were placed in this encounter.    Disposition:   FU with *** in {gen number [id number]:629476} {Days to years:10300}.   Patient is agreeable to this plan and will call if any problems  develop in the interim.   SignedNorma Fredrickson, NP  09/09/2019 2:31 PM  [facility] [address] [place], [place]  [address] Phone: [phone] Fax: [phone]

## 2019-09-12 ENCOUNTER — Ambulatory Visit: Payer: Medicaid Other | Admitting: Nurse Practitioner

## 2019-09-24 NOTE — Progress Notes (Deleted)
CARDIOLOGY OFFICE NOTE  Date:  09/24/2019    [name] Date of Birth: 07-25-1975 Medical Record #[id number]  PCP:  Kallie Locks, FNP  Cardiologist:  Townsend Roger  No chief complaint on file.   History of Present Illness: [name] is a 44 y.o. female who presents today for a follow up visit. Seen for Dr. Eden Emms.   She has a history of HTN, sickle cell trait, tobacco abuse, GERD, dilated cardiomyopathyand medical non-compliance.  In 2018,she was admitted with chest pain and found to be markedly hypertensive secondary to noncompliance. An echocardiogram showed and LVEF of 35-40%,mild LVH, G2DD, mild RV dysfunction. 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 She was started on Entresto however she runs out of her medications and is not always compliant with therapies.  Last seen by Dr. Eden Emms in September of 2019 - had some atpyical chest pain - was referred for stress testing - low risk with no ischemia - EF of 48% but visually estimated at 55%.   She had a telehealth visit with Robbie Lis, PA back in May of 2020 following an ER visit for chest pain - BP again severely elevated - she had her medicines restarted but did leave AMA.  No BP monitor at home.   We did a telehealth visit back earlier this month - some swelling - little short of breath. On Medicaid and could now get her medicines. Short course of Lasix given - need in office visit for further assessment.   The patient {does/does not:200015} have symptoms concerning for COVID-19 infection (fever, chills, cough, or new shortness of breath).   Comes in today. Here with   Past Medical History:  Diagnosis Date  . Anemia   . CHF (congestive heart failure) (HCC)   . Common migraine with intractable migraine 01/25/2017  . GERD (gastroesophageal reflux disease)   . Hypertension   . Miscarriage    x3  . Sickle cell trait (HCC)   . Vitamin D deficiency 07/2019    Past  Surgical History:  Procedure Laterality Date  . BREAST SURGERY     cyst removal 1990  . DILATION AND CURETTAGE OF UTERUS     four miscarriages  . TUBAL LIGATION Bilateral 08/17/2012   Procedure: POST PARTUM TUBAL LIGATION;  Surgeon: Allie Bossier, MD;  Location: [facility];  Service: Gynecology;  Laterality: Bilateral;     Medications: No outpatient medications have been marked as taking for the 09/30/19 encounter (Appointment) with Rosalio Macadamia, NP.     Allergies: No Known Allergies  Social History: The patient  reports that she has been smoking cigarettes. She has a 3.00 pack-year smoking history. She has never used smokeless tobacco. She reports current alcohol use. She reports that she does not use drugs.   Family History: The patient's ***family history includes Hypertension in her maternal grandfather, maternal grandmother, and mother.   Review of Systems: Please see the history of present illness.   All other systems are reviewed and negative.   Physical Exam: VS:  There were no vitals taken for this visit. Marland Kitchen  BMI There is no height or weight on file to calculate BMI.  Wt Readings from Last 3 Encounters:  09/02/19 119 lb 12.8 oz (54.3 kg)  07/21/19 118 lb 3.2 oz (53.6 kg)  12/17/18 122 lb (55.3 kg)    General: Pleasant. Well developed, well nourished and in no acute distress.   HEENT: Normal.  Neck: Supple, no  JVD, carotid bruits, or masses noted.  Cardiac: ***Regular rate and rhythm. No murmurs, rubs, or gallops. No edema.  Respiratory:  Lungs are clear to auscultation bilaterally with normal work of breathing.  GI: Soft and nontender.  MS: No deformity or atrophy. Gait and ROM intact.  Skin: Warm and dry. Color is normal.  Neuro:  Strength and sensation are intact and no gross focal deficits noted.  Psych: Alert, appropriate and with normal affect.   LABORATORY DATA:  EKG:  EKG {ACTION; IS/IS ZOX:09604540} ordered today.  Personally reviewed by me. This  demonstrates ***.  Lab Results  Component Value Date   WBC 8.2 04/02/2019   HGB 12.6 04/02/2019   HCT 37.3 04/02/2019   PLT 227 04/02/2019   GLUCOSE 90 04/02/2019   CHOL 247 (H) 11/13/2018   TRIG 239 (H) 11/13/2018   HDL 91 11/13/2018   LDLCALC 108 (H) 11/13/2018   ALT 47 (H) 04/02/2019   AST 49 (H) 04/02/2019   NA 138 04/02/2019   K 4.2 07/21/2019   CL 108 04/02/2019   CREATININE 1.02 (H) 04/02/2019   BUN 13 04/02/2019   CO2 18 (L) 04/02/2019   TSH 2.890 11/13/2018   HGBA1C 4.9 11/13/2018     BNP (last 3 results) Recent Labs    11/09/18 1048  BNP 30.0    ProBNP (last 3 results) No results for input(s): PROBNP in the last 8760 hours.   Other Studies Reviewed Today:  Echocardiogram: 11/08/16 Study Conclusions  - Left ventricle: The cavity size was normal. Wall thickness was increased in a pattern of mild LVH. Systolic function was moderately reduced. The estimated ejection fraction was in the range of 35% to 40%. Features are consistent with a pseudonormal left ventricular filling pattern, with concomitant abnormal relaxation and increased filling pressure (grade 2 diastolic dysfunction). - Right ventricle: Systolic function was mildly reduced.  Lexiscan stress test 04/05/2018:   Nuclear stress EF is calculated at 48% but visually appears normal at 55%.  There was no ST segment deviation noted during stress.  There is evidence of apical thinning but no ischemia noted.  This is a low risk study.   ASSESSMENT & PLAN:    1.  HTN - unclear what her BP is doing.   2. Medical non compliance - she reports currently taking everything as recommended.   3. History of chest pain - not endorsed.   4. Chronic systolic HF - in the setting of poorly controlled HTN - she endorses more swelling in her hands and especially her feet - short course of lasix - needs in office assessment with plans for lab and arrange echocardiogram.   5. Prior  elevated LFTs - needs repeating.   6. Substance abuse   . COVID-19 Education: The signs and symptoms of COVID-19 were discussed with the patient and how to seek care for testing (follow up with PCP or arrange E-visit).  The importance of social distancing, staying at home, hand hygiene and wearing a mask when out in public were discussed today.  Current medicines are reviewed with the patient today.  The patient does not have concerns regarding medicines other than what has been noted above.  The following changes have been made:  See above.  Labs/ tests ordered today include:   No orders of the defined types were placed in this encounter.    Disposition:   FU with *** in {gen number [id number]:191478} {Days to years:10300}.   Patient is agreeable to this plan and  will call if any problems develop in the interim.   SignedNorma Fredrickson, NP  09/24/2019 7:56 AM  [facility] [address] [place], [place]  [address] Phone: [phone] Fax: [phone]

## 2019-09-30 ENCOUNTER — Ambulatory Visit: Payer: Medicaid Other | Admitting: Nurse Practitioner

## 2019-10-17 NOTE — Progress Notes (Signed)
Date:  10/24/2019   ID:  [name], DOB 1976-03-02, MRN [id number]  Patient Location:  Work  Provider location:   Home  PCP:  Kallie Locks, FNP  Cardiologist:   Charlton Haws, MD  Electrophysiologist:  None   Chief Complaint:  Follow up visit.   History of Present Illness:     44 y.o.  history of HTN, sickle cell trait, tobacco abuse, GERD, dilated cardiomyopathyand medical non-compliance.   In 2018, she was admitted with chest pain and found to be markedly hypertensive secondary to noncompliance. An echocardiogram showed and LVEF of 35-40%,mild LVH, G2DD, mild RV dysfunction. 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 She was started on Entresto however she runs out of her medications and is not always compliant with therapies.  September 2019  was referred for stress testing - low risk with no ischemia - EF of 48% but visually estimated at 55%.    09/21/19 had Tele Visit with NP No BP available Described more swelling and dyspnea Says her Medicaid is paying for her medicines now - so cost is not an issue. No chest pain. Not dizzy.  Given Lasix and told to f/u in person for labs and echo   She says she has been compliant Has 7/42 yo girls at home. She is working Still smoking 8 cigs/day   Past Medical History:  Diagnosis Date  . Anemia   . CHF (congestive heart failure) (HCC)   . Common migraine with intractable migraine 01/25/2017  . GERD (gastroesophageal reflux disease)   . Hypertension   . Miscarriage    x3  . Sickle cell trait (HCC)   . Vitamin D deficiency 07/2019   Past Surgical History:  Procedure Laterality Date  . BREAST SURGERY     cyst removal 1990  . DILATION AND CURETTAGE OF UTERUS     four miscarriages  . TUBAL LIGATION Bilateral 08/17/2012   Procedure: POST PARTUM TUBAL LIGATION;  Surgeon: Allie Bossier, MD;  Location: [facility];  Service: Gynecology;  Laterality: Bilateral;     Current Meds  Medication Sig  . albuterol  (VENTOLIN HFA) 108 (90 Base) MCG/ACT inhaler Inhale 2 puffs into the lungs every 4 (four) hours as needed for wheezing or shortness of breath (cough, shortness of breath or wheezing.).  Marland Kitchen budesonide-formoterol (SYMBICORT) 80-4.5 MCG/ACT inhaler Inhale 2 puffs into the lungs 2 (two) times daily.  Marland Kitchen buPROPion (WELLBUTRIN SR) 150 MG 12 hr tablet Take 1 tablet (150 mg total) by mouth 2 (two) times daily.  . carvedilol (COREG) 25 MG tablet Take 1 tablet (25 mg total) by mouth 2 (two) times daily with a meal.  . cyclobenzaprine (FLEXERIL) 10 MG tablet Take 1 tablet (10 mg total) by mouth 2 (two) times daily as needed for muscle spasms.  . famotidine (PEPCID) 20 MG tablet Take 1 tablet (20 mg total) by mouth 2 (two) times daily.  . furosemide (LASIX) 20 MG tablet Take 1 tablet (20 mg total) by mouth daily.  Marland Kitchen gabapentin (NEURONTIN) 300 MG capsule Take 1 capsule (300 mg total) by mouth 3 (three) times daily.  . hydrALAZINE (APRESOLINE) 25 MG tablet Take 1 tablet (25 mg total) by mouth 3 (three) times daily.  Marland Kitchen ibuprofen (ADVIL) 800 MG tablet Take 1 tablet (800 mg total) by mouth every 8 (eight) hours as needed.  . nitroGLYCERIN (NITROSTAT) 0.3 MG SL tablet Place 1 tablet (0.3 mg total) under the tongue every 5 (five) minutes as  needed for chest pain.  Marland Kitchen RESTASIS 0.05 % ophthalmic emulsion Place 1 drop into both eyes as directed.  . sacubitril-valsartan (ENTRESTO) 97-103 MG Take 1 tablet by mouth 2 (two) times daily.  . Vitamin D, Ergocalciferol, (DRISDOL) 1.25 MG (50000 UNIT) CAPS capsule Take 1 capsule (50,000 Units total) by mouth every 7 (seven) days.     Allergies:   Patient has no known allergies.   Social History   Tobacco Use  . Smoking status: Current Every Day Smoker    Packs/day: 0.30    Years: 10.00    Pack years: 3.00    Types: Cigarettes  . Smokeless tobacco: Never Used  Substance Use Topics  . 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 Drug use: No       Family Hx: The patient's family history includes Hypertension in her maternal grandfather, maternal grandmother, and mother. There is no history of Other.  ROS:   Please see the history of present illness.   All other systems reviewed are negative.    Objective:    Vital Signs:  BP (!) 158/92   Pulse 97   Ht 5\' 4"  (1.626 m)   Wt 119 lb 12.8 oz (54.3 kg)   SpO2 97%   BMI 20.56 kg/m    Wt Readings from Last 3 Encounters:  10/24/19 119 lb 12.8 oz (54.3 kg)  09/02/19 119 lb 12.8 oz (54.3 kg)  07/21/19 118 lb 3.2 oz (53.6 kg)   Affect appropriate Healthy:  appears stated age HEENT: normal Neck supple with no adenopathy JVP normal no bruits no thyromegaly Lungs clear with no wheezing and good diaphragmatic motion Heart:  S1/S2 no murmur, no rub, gallop or click PMI normal Abdomen: benighn, BS positve, no tenderness, no AAA no bruit.  No HSM or HJR Distal pulses intact with no bruits No edema Neuro non-focal Skin warm and dry No muscular weakness   Labs/Other Tests and Data Reviewed:    Lab Results  Component Value Date   WBC 8.2 04/02/2019   HGB 12.6 04/02/2019   HCT 37.3 04/02/2019   PLT 227 04/02/2019   GLUCOSE 90 04/02/2019   CHOL 247 (H) 11/13/2018   TRIG 239 (H) 11/13/2018   HDL 91 11/13/2018   LDLCALC 108 (H) 11/13/2018   ALT 47 (H) 04/02/2019   AST 49 (H) 04/02/2019   NA 138 04/02/2019   K 4.2 07/21/2019   CL 108 04/02/2019   CREATININE 1.02 (H) 04/02/2019   BUN 13 04/02/2019   CO2 18 (L) 04/02/2019   TSH 2.890 11/13/2018   HGBA1C 4.9 11/13/2018     BNP (last 3 results) Recent Labs    11/09/18 1048  BNP 30.0    ProBNP (last 3 results) No results for input(s): PROBNP in the last 8760 hours.    Prior CV studies:    The following studies were reviewed today:  Echocardiogram: 11/08/16 Study Conclusions  - Left ventricle: The cavity size was normal. Wall thickness was increased in a pattern of mild LVH. Systolic function  was moderately reduced. The estimated ejection fraction was in the range of 35% to 40%. Features are consistent with a pseudonormal left ventricular filling pattern, with concomitant abnormal relaxation and increased filling pressure (grade 2 diastolic dysfunction). - Right ventricle: Systolic function was mildly reduced.  Lexiscan stress test 04/05/2018:   Nuclear stress EF is calculated at 48% but visually appears normal at 55%.  There was no ST  segment deviation noted during stress.  There is evidence of apical thinning but no ischemia noted.  This is a low risk study.   ASSESSMENT & PLAN:    1.  HTN - discussed better diet, nicotine continue current meds   2. Medical non compliance - she reports currently taking everything as recommended.   3. History of chest pain - resolved non ischemic myovue 04/05/18   4. Chronic systolic HF - in the setting of poorly controlled HTN - f/u BEMET/BNP and echo ordered    5. Prior elevated LFTs - needs repeating.   6. Substance abuse  7. Smoking : counseled on cessation < 10 minutes CXR ordered   Medication Adjustments/Labs and Tests Ordered: Current medicines are reviewed at length with the patient today.  Concerns regarding medicines are outlined above.   Tests Ordered:   CMP, CBC, BNP TTE for cardiomyopathy  CXR smoking   Medication Changes: No orders of the defined types were placed in this encounter.   F/U in 6 months if labs and TTE stable    Patient is agreeable to this plan and will call if any problems develop in the interim.   Signed, Charlton Haws, MD  10/24/2019 10:20 AM    [facility]

## 2019-10-24 ENCOUNTER — Encounter: Payer: Self-pay | Admitting: Cardiovascular Disease

## 2019-10-24 ENCOUNTER — Other Ambulatory Visit: Payer: Self-pay

## 2019-10-24 ENCOUNTER — Ambulatory Visit (INDEPENDENT_AMBULATORY_CARE_PROVIDER_SITE_OTHER): Payer: Medicaid Other | Admitting: Cardiovascular Disease

## 2019-10-24 VITALS — BP 158/92 | HR 97 | Ht 64.0 in | Wt 119.8 lb

## 2019-10-24 DIAGNOSIS — I10 Essential (primary) hypertension: Secondary | ICD-10-CM | POA: Diagnosis not present

## 2019-10-24 DIAGNOSIS — I429 Cardiomyopathy, unspecified: Secondary | ICD-10-CM | POA: Diagnosis not present

## 2019-10-24 DIAGNOSIS — F17200 Nicotine dependence, unspecified, uncomplicated: Secondary | ICD-10-CM

## 2019-10-24 DIAGNOSIS — R059 Cough, unspecified: Secondary | ICD-10-CM

## 2019-10-24 DIAGNOSIS — I5022 Chronic systolic (congestive) heart failure: Secondary | ICD-10-CM | POA: Diagnosis not present

## 2019-10-24 DIAGNOSIS — R05 Cough: Secondary | ICD-10-CM

## 2019-10-24 NOTE — Patient Instructions (Addendum)
Medication Instructions:   *If you need a refill on your cardiac medications before your next appointment, please call your pharmacy*   Lab Work: Your physician recommends that you have lab work today- CMET, CBC, BNP  If you have labs (blood work) drawn today and your tests are completely normal, you will receive your results only by: Marland Kitchen MyChart Message (if you have MyChart) OR . A paper copy in the mail If you have any lab test that is abnormal or we need to change your treatment, we will call you to review the results.   Testing/Procedures: Your physician has requested that you have an echocardiogram. Echocardiography is a painless test that uses sound waves to create images of your heart. It provides your doctor with information about the size and shape of your heart and how well your heart's chambers and valves are working. This procedure takes approximately one hour. There are no restrictions for this procedure.  A chest x-ray takes a picture of the organs and structures inside the chest, including the heart, lungs, and blood vessels. This test can show several things, including, whether the heart is enlarges; whether fluid is building up in the lungs; and whether pacemaker / defibrillator leads are still in place.  Follow-Up: At [facility], you and your health needs are our priority.  As part of our continuing mission to provide you with exceptional heart care, we have created designated Provider Care Teams.  These Care Teams include your primary Cardiologist (physician) and Advanced Practice Providers (APPs -  Physician Assistants and Nurse Practitioners) who all work together to provide you with the care you need, when you need it.  We recommend signing up for the patient portal called "MyChart".  Sign up information is provided on this After Visit Summary.  MyChart is used to connect with patients for Virtual Visits (Telemedicine).  Patients are able to view lab/test results,  encounter notes, upcoming appointments, etc.  Non-urgent messages can be sent to your provider as well.   To learn more about what you can do with MyChart, go to [email or web address].    Your next appointment:   12 month(s)  The format for your next appointment:   In Person  Provider:   You may see Charlton Haws, MD or one of the following Advanced Practice Providers on your designated Care Team:    Norma Fredrickson, NP  Nada Boozer, NP  Georgie Chard, NP

## 2019-10-25 LAB — COMPREHENSIVE METABOLIC PANEL
ALT: 48 IU/L — ABNORMAL HIGH (ref 0–32)
AST: 61 IU/L — ABNORMAL HIGH (ref 0–40)
Albumin/Globulin Ratio: 1.6 (ref 1.2–2.2)
Albumin: 4.4 g/dL (ref 3.8–4.8)
Alkaline Phosphatase: 84 IU/L (ref 39–117)
BUN/Creatinine Ratio: 16 (ref 9–23)
BUN: 11 mg/dL (ref 6–24)
Bilirubin Total: 0.3 mg/dL (ref 0.0–1.2)
CO2: 20 mmol/L (ref 20–29)
Calcium: 9.2 mg/dL (ref 8.7–10.2)
Chloride: 106 mmol/L (ref 96–106)
Creatinine, Ser: 0.7 mg/dL (ref 0.57–1.00)
GFR calc Af Amer: 124 mL/min/{1.73_m2} (ref >59)
GFR calc non Af Amer: 108 mL/min/{1.73_m2} (ref >59)
Globulin, Total: 2.7 g/dL (ref 1.5–4.5)
Glucose: 73 mg/dL (ref 65–99)
Potassium: 4.2 mmol/L (ref 3.5–5.2)
Sodium: 142 mmol/L (ref 134–144)
Total Protein: 7.1 g/dL (ref 6.0–8.5)

## 2019-10-25 LAB — CBC WITH DIFFERENTIAL/PLATELET
Basophils Absolute: 0.1 10*3/uL (ref 0.0–0.2)
Basos: 1 %
EOS (ABSOLUTE): 0.2 10*3/uL (ref 0.0–0.4)
Eos: 3 %
Hematocrit: 37 % (ref 34.0–46.6)
Hemoglobin: 11.9 g/dL (ref 11.1–15.9)
Immature Grans (Abs): 0 10*3/uL (ref 0.0–0.1)
Immature Granulocytes: 1 %
Lymphocytes Absolute: 1.7 10*3/uL (ref 0.7–3.1)
Lymphs: 31 %
MCH: 29.4 pg (ref 26.6–33.0)
MCHC: 32.2 g/dL (ref 31.5–35.7)
MCV: 91 fL (ref 79–97)
Monocytes Absolute: 0.3 10*3/uL (ref 0.1–0.9)
Monocytes: 6 %
Neutrophils Absolute: 3.2 10*3/uL (ref 1.4–7.0)
Neutrophils: 58 %
Platelets: 285 10*3/uL (ref 150–450)
RBC: 4.05 x10E6/uL (ref 3.77–5.28)
RDW: 13.1 % (ref 11.7–15.4)
WBC: 5.5 10*3/uL (ref 3.4–10.8)

## 2019-10-25 LAB — PRO B NATRIURETIC PEPTIDE: NT-Pro BNP: 23 pg/mL (ref 0–130)

## 2019-11-10 ENCOUNTER — Telehealth: Payer: Self-pay | Admitting: Cardiovascular Disease

## 2019-11-10 NOTE — Telephone Encounter (Signed)
Called patient back about her message. Went over patient's lab work results. Informed patient that a copy would be sent to her PCP. Per Dr. Simone Curia okay BNP normal, LFT's up a bit make sure she's not drinking and f/u with primary for that. Patient stated she has been drinking and would work on that and follow up with her PCP.

## 2019-11-10 NOTE — Telephone Encounter (Signed)
New message   Patient states that she is calling to get some additional information about her labs for her liver. Please call.

## 2019-11-13 ENCOUNTER — Other Ambulatory Visit (HOSPITAL_COMMUNITY): Payer: Medicaid Other

## 2019-12-03 ENCOUNTER — Other Ambulatory Visit: Payer: Self-pay

## 2019-12-03 ENCOUNTER — Encounter (HOSPITAL_COMMUNITY): Payer: Self-pay

## 2019-12-03 ENCOUNTER — Emergency Department (HOSPITAL_COMMUNITY)
Admission: EM | Admit: 2019-12-03 | Discharge: 2019-12-04 | Disposition: A | Discharge status: Home / Self Care | Payer: Medicaid Other | Attending: Emergency Medicine | Admitting: Emergency Medicine

## 2019-12-03 DIAGNOSIS — Z79899 Other long term (current) drug therapy: Secondary | ICD-10-CM | POA: Diagnosis not present

## 2019-12-03 DIAGNOSIS — R0602 Shortness of breath: Secondary | ICD-10-CM | POA: Insufficient documentation

## 2019-12-03 DIAGNOSIS — Y908 Blood alcohol level of 240 mg/100 ml or more: Secondary | ICD-10-CM | POA: Diagnosis not present

## 2019-12-03 DIAGNOSIS — R55 Syncope and collapse: Secondary | ICD-10-CM | POA: Diagnosis present

## 2019-12-03 DIAGNOSIS — E876 Hypokalemia: Secondary | ICD-10-CM | POA: Insufficient documentation

## 2019-12-03 DIAGNOSIS — E860 Dehydration: Secondary | ICD-10-CM | POA: Diagnosis not present

## 2019-12-03 DIAGNOSIS — I5042 Chronic combined systolic (congestive) and diastolic (congestive) heart failure: Secondary | ICD-10-CM | POA: Insufficient documentation

## 2019-12-03 DIAGNOSIS — F10920 Alcohol use, unspecified with intoxication, uncomplicated: Secondary | ICD-10-CM | POA: Diagnosis not present

## 2019-12-03 DIAGNOSIS — F17210 Nicotine dependence, cigarettes, uncomplicated: Secondary | ICD-10-CM | POA: Diagnosis not present

## 2019-12-03 DIAGNOSIS — I110 Hypertensive heart disease with heart failure: Secondary | ICD-10-CM | POA: Insufficient documentation

## 2019-12-03 LAB — CBC
HCT: 37.3 % (ref 36.0–46.0)
Hemoglobin: 12.3 g/dL (ref 12.0–15.0)
MCH: 29.7 pg (ref 26.0–34.0)
MCHC: 33 g/dL (ref 30.0–36.0)
MCV: 90.1 fL (ref 80.0–100.0)
Platelets: 276 10*3/uL (ref 150–400)
RBC: 4.14 MIL/uL (ref 3.87–5.11)
RDW: 14.1 % (ref 11.5–15.5)
WBC: 6.2 10*3/uL (ref 4.0–10.5)
nRBC: 0 % (ref 0.0–0.2)

## 2019-12-03 NOTE — ED Notes (Signed)
Patience Musca Brother [phone]

## 2019-12-03 NOTE — ED Triage Notes (Signed)
Pt BIB [facility] for eval of witnessed syncopal episode by daughter. EMS arrived to pt laying on floor, but awake and alert. +ETOH, daughter states pt is not answering questions per her baseline. Pt unable to answer questions appropriately for EMS. Uncooperative w/ EMS

## 2019-12-03 NOTE — ED Provider Notes (Signed)
[facility] EMERGENCY DEPARTMENT Provider Note   CSN: [id number] Arrival date & time: 12/03/19  2236   History Chief Complaint  Patient presents with  . Altered Mental Status    [name] is a 44 y.o. 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 She states that for the past 3-4 days she has been very lightheaded while at work and will get overheated and feel like she is going to pass out. She's also been having chest pain and shortness of breath with a cough which she attributed to her acid reflux. Tonight she was making dinner and got lightheaded and then pass out. This was witnessed by her two daughters. EMS was called and she was initially uncooperative but allowed transport. She is tearful and states she wants to go home but acknowledges she doesn't feel well. She reports associated headache and bilateral leg pain. She takes gabapentin for her leg pain. I spoke with her brother Colin Broach with the patient's permission who states that his main concern is her drinking. He states that she drinks from when she gets up to when she goes to bed at night. He would like her to get help with her drinking. As far as he knows she's been taking her prescribed medicines.   HPI   Past Medical History:  Diagnosis Date  . Anemia   . CHF (congestive heart failure) ([place])   . Common migraine with intractable migraine 01/25/2017  . GERD (gastroesophageal reflux disease)   . Hypertension   . Miscarriage    x3  . Sickle cell trait ([place])   . Vitamin D deficiency 07/2019    Patient Active Problem List   Diagnosis Date Noted  . Bilateral leg cramps 12/19/2018  . Vitamin D deficiency 12/19/2018  . Chest discomfort 11/14/2018  . Wrist strain, right, initial encounter 11/14/2018  . Common migraine with intractable migraine 01/25/2017  . Hypertension 12/27/2016  . Chronic combined systolic and diastolic congestive  heart failure ([place]) 11/09/2016  . Chest pain 11/08/2016  . Hypertensive urgency 11/08/2016  . Admission for sterilization 08/17/2012  . Spontaneous vaginal delivery 08/16/2012  . Consultation for sterilization 07/25/2012  . Poor fetal growth, affecting management of mother, antepartum condition or complication 81/15/7262  . Chronic hypertension complicating or reason for care during pregnancy 04/30/2012  . Supervision of high-risk pregnancy 04/30/2012  . Sickle cell trait ([place]) 04/30/2012    Past Surgical History:  Procedure Laterality Date  . BREAST SURGERY     cyst removal 1990  . DILATION AND CURETTAGE OF UTERUS     four miscarriages  . TUBAL LIGATION Bilateral 08/17/2012   Procedure: POST PARTUM TUBAL LIGATION;  Surgeon: Emily Filbert, MD;  Location: [facility];  Service: Gynecology;  Laterality: Bilateral;     OB History    Gravida  5   Para  2   Term  2   Preterm  0   AB  3   Living  2     SAB  3   TAB  0   Ectopic  0   Multiple  0   Live Births  2           Family History  Problem Relation Age of Onset  . Hypertension Mother   . Hypertension Maternal Grandmother   . Hypertension Maternal Grandfather   . Other Neg Hx     Social History   Tobacco Use  . Smoking status:  Current Every Day Smoker    Packs/day: 0.30    Years: 10.00    Pack years: 3.00    Types: Cigarettes  . Smokeless tobacco: Never Used  Substance Use Topics  . 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 Drug use: No    Home Medications Prior to Admission medications   Medication Sig Start Date End Date Taking? Authorizing Provider  albuterol (VENTOLIN HFA) 108 (90 Base) MCG/ACT inhaler Inhale 2 puffs into the lungs every 4 (four) hours as needed for wheezing or shortness of breath (cough, shortness of breath or wheezing.). 11/13/18   Kallie Locks, FNP  budesonide-formoterol (SYMBICORT) 80-4.5 MCG/ACT inhaler Inhale 2 puffs into the lungs 2 (two)  times daily. 05/14/19   Kallie Locks, FNP  buPROPion (WELLBUTRIN SR) 150 MG 12 hr tablet Take 1 tablet (150 mg total) by mouth 2 (two) times daily. 07/21/19   Kallie Locks, FNP  carvedilol (COREG) 25 MG tablet Take 1 tablet (25 mg total) by mouth 2 (two) times daily with a meal. 07/21/19 02/16/20  Kallie Locks, FNP  cyclobenzaprine (FLEXERIL) 10 MG tablet Take 1 tablet (10 mg total) by mouth 2 (two) times daily as needed for muscle spasms. 07/21/19   Kallie Locks, FNP  famotidine (PEPCID) 20 MG tablet Take 1 tablet (20 mg total) by mouth 2 (two) times daily. 07/21/19   Kallie Locks, FNP  furosemide (LASIX) 20 MG tablet Take 1 tablet (20 mg total) by mouth daily. 09/02/19 12/01/19  Rosalio Macadamia, NP  gabapentin (NEURONTIN) 300 MG capsule Take 1 capsule (300 mg total) by mouth 3 (three) times daily. 11/13/18 10/24/19  Kallie Locks, FNP  hydrALAZINE (APRESOLINE) 25 MG tablet Take 1 tablet (25 mg total) by mouth 3 (three) times daily. 07/21/19 02/16/20  Kallie Locks, FNP  ibuprofen (ADVIL) 800 MG tablet Take 1 tablet (800 mg total) by mouth every 8 (eight) hours as needed. 07/21/19   Kallie Locks, FNP  nitroGLYCERIN (NITROSTAT) 0.3 MG SL tablet Place 1 tablet (0.3 mg total) under the tongue every 5 (five) minutes as needed for chest pain. 11/14/18   Kallie Locks, FNP  RESTASIS 0.05 % ophthalmic emulsion Place 1 drop into both eyes as directed. 11/13/18   Kallie Locks, FNP  sacubitril-valsartan (ENTRESTO) 97-103 MG Take 1 tablet by mouth 2 (two) times daily. 02/03/19   Kallie Locks, FNP  Vitamin D, Ergocalciferol, (DRISDOL) 1.25 MG (50000 UNIT) CAPS capsule Take 1 capsule (50,000 Units total) by mouth every 7 (seven) days. 07/24/19   Kallie Locks, FNP    Allergies    Patient has no known allergies.  Review of Systems   Review of Systems  Constitutional: Negative for chills and fever.  Respiratory: Positive for cough and shortness of breath. Negative for  wheezing.   Cardiovascular: Positive for chest pain. Negative for palpitations and leg swelling.  Gastrointestinal: Negative for abdominal pain, nausea and vomiting.  Musculoskeletal: Positive for arthralgias and myalgias.  Neurological: Positive for dizziness, syncope, light-headedness and headaches. Negative for numbness.  All other systems reviewed and are negative.   Physical Exam Updated Vital Signs BP (!) 159/105 (BP Location: Left Arm)   Pulse 83   Temp 98.4 F (36.9 C) (Oral)   Resp 16   Ht 5\' 4"  (1.626 m)   Wt 55 kg   SpO2 97%   BMI 20.81 kg/m   Physical Exam Vitals  and nursing note reviewed.  Constitutional:      General: She is not in acute distress.    Appearance: Normal appearance. She is well-developed. She is not ill-appearing.     Comments: Tearful. NAD. Clinically intoxicated  HENT:     Head: Normocephalic and atraumatic.  Eyes:     General: No scleral icterus.       Right eye: No discharge.        Left eye: No discharge.     Conjunctiva/sclera: Conjunctivae normal.     Pupils: Pupils are equal, round, and reactive to light.  Cardiovascular:     Rate and Rhythm: Normal rate and regular rhythm.  Pulmonary:     Effort: Pulmonary effort is normal. No respiratory distress.     Breath sounds: Decreased air movement present. Examination of the right-upper field reveals decreased breath sounds. Examination of the right-middle field reveals decreased breath sounds. Decreased breath sounds present.  Abdominal:     General: There is no distension.     Palpations: Abdomen is soft.     Tenderness: There is no abdominal tenderness.  Musculoskeletal:     Cervical back: Normal range of motion.  Skin:    General: Skin is warm and dry.  Neurological:     Mental Status: She is alert and oriented to person, place, and time.  Psychiatric:        Behavior: Behavior normal.     ED Results / Procedures / Treatments   Labs (all labs ordered are listed, but only  abnormal results are displayed) Labs Reviewed  COMPREHENSIVE METABOLIC PANEL - Abnormal; Notable for the following components:      Result Value   Potassium 3.2 (*)    CO2 18 (*)    ALT 45 (*)    Anion gap 18 (*)    All other components within normal limits  ETHANOL - Abnormal; Notable for the following components:   Alcohol, Ethyl (B) 259 (*)    All other components within normal limits  I-STAT BETA HCG BLOOD, ED ([facility], [facility], [facility] ONLY) - Abnormal; Notable for the following components:   I-stat hCG, quantitative 8.3 (*)    All other components within normal limits  CBC  BRAIN NATRIURETIC PEPTIDE  CBG MONITORING, ED  TROPONIN I (HIGH SENSITIVITY)  TROPONIN I (HIGH SENSITIVITY)    EKG None  Radiology DG Chest 2 View  Result Date: 12/04/2019 CLINICAL DATA:  Chest pain and shortness of breath, recent syncopal episode EXAM: CHEST - 2 VIEW COMPARISON:  04/02/2019 FINDINGS: Cardiac shadow is within normal limits. The lungs are clear bilaterally. No focal infiltrate or effusion is noted. No bony abnormality is seen. IMPRESSION: No active cardiopulmonary disease. Electronically Signed   By: Alcide Clever M.D.   On: 12/04/2019 01:58    Procedures Procedures (including critical care time)  Medications Ordered in ED Medications  lactated ringers bolus 500 mL (0 mLs Intravenous Stopped 12/04/19 0206)  potassium chloride SA (KLOR-CON) CR tablet 40 mEq (40 mEq Oral Given 12/04/19 0140)  ketorolac (TORADOL) 15 MG/ML injection 15 mg (15 mg Intravenous Given 12/04/19 0209)    ED Course  I have reviewed the triage vital signs and the nursing notes.  Pertinent labs & imaging results that were available during my care of the patient were reviewed by me and considered in my medical decision making (see chart for details).  44 year old female presents with syncopal episode. Her BP is elevated but otherwise vitals are normal. She is  tearful and mildly intoxicated but cooperative and alert. Lung sounds are  decreased. Heart is regular rate and rhythm. Abdomen is soft and non tender. Will obtain labs, trop, BNP. EKG is NSR.  Labs show mild hypokalemia (3.2), low bicarb (18), and elevated anion gap (18). This is likely secondary to her alcohol intake. ETOH is 259. Will give fluids and potasium.  Cardiac work up is reassuring. EKG is normal. 1st and 2nd trop are negative. BNP is 36 and CXR is clear. I spoke with her brother who states his main concern is her drinking. He states she has CP/SOB all the time. He is wondering if she can be forced in to rehab and I advised that the choice to go to rehab would be up to her. I did discuss this with the patient as well. She is not interested in rehab at this time and states she can stop drinking whenever she wants. She was encouraged to maintain fluid intake and eat a healthy diet and to f/u with her PCP. Her brother came and picked her up.  MDM Rules/Calculators/A&P                       Final Clinical Impression(s) / ED Diagnoses Final diagnoses:  Alcoholic intoxication without complication (HCC)  Dehydration  Hypokalemia    Rx / DC Orders ED Discharge Orders    None       Bethel Born, PA-C 12/04/19 0348    Marily Memos, MD 12/04/19 [phone]

## 2019-12-04 ENCOUNTER — Emergency Department (HOSPITAL_COMMUNITY): Payer: Medicaid Other

## 2019-12-04 LAB — COMPREHENSIVE METABOLIC PANEL
ALT: 45 U/L — ABNORMAL HIGH (ref 0–44)
AST: 40 U/L (ref 15–41)
Albumin: 3.9 g/dL (ref 3.5–5.0)
Alkaline Phosphatase: 62 U/L (ref 38–126)
Anion gap: 18 — ABNORMAL HIGH (ref 5–15)
BUN: 13 mg/dL (ref 6–20)
CO2: 18 mmol/L — ABNORMAL LOW (ref 22–32)
Calcium: 8.9 mg/dL (ref 8.9–10.3)
Chloride: 107 mmol/L (ref 98–111)
Creatinine, Ser: 0.63 mg/dL (ref 0.44–1.00)
GFR calc Af Amer: >60 mL/min (ref >60)
GFR calc non Af Amer: >60 mL/min (ref >60)
Glucose, Bld: 85 mg/dL (ref 70–99)
Potassium: 3.2 mmol/L — ABNORMAL LOW (ref 3.5–5.1)
Sodium: 143 mmol/L (ref 135–145)
Total Bilirubin: 0.4 mg/dL (ref 0.3–1.2)
Total Protein: 7 g/dL (ref 6.5–8.1)

## 2019-12-04 LAB — I-STAT BETA HCG BLOOD, ED (MC, WL, AP ONLY): I-stat hCG, quantitative: 8.3 m[IU]/mL — ABNORMAL HIGH (ref <5)

## 2019-12-04 LAB — TROPONIN I (HIGH SENSITIVITY)
Troponin I (High Sensitivity): 4 ng/L (ref <18)
Troponin I (High Sensitivity): <2 ng/L (ref <18)

## 2019-12-04 LAB — CBG MONITORING, ED: Glucose-Capillary: 76 mg/dL (ref 70–99)

## 2019-12-04 LAB — BRAIN NATRIURETIC PEPTIDE: B Natriuretic Peptide: 36.4 pg/mL (ref 0.0–100.0)

## 2019-12-04 LAB — ETHANOL: Alcohol, Ethyl (B): 259 mg/dL — ABNORMAL HIGH (ref <10)

## 2019-12-04 MED ORDER — LACTATED RINGERS IV BOLUS
500.0000 mL | Freq: Once | INTRAVENOUS | Status: AC
  Administered 2019-12-04: 500 mL via INTRAVENOUS

## 2019-12-04 MED ORDER — KETOROLAC TROMETHAMINE 15 MG/ML IJ SOLN
15.0000 mg | Freq: Once | INTRAMUSCULAR | Status: AC
  Administered 2019-12-04: 15 mg via INTRAVENOUS
  Filled 2019-12-04: qty 1

## 2019-12-04 MED ORDER — SODIUM CHLORIDE 0.9 % IV BOLUS: 1000.0000 mL | Freq: Once | INTRAVENOUS | Status: DC

## 2019-12-04 MED ORDER — POTASSIUM CHLORIDE CRYS ER 20 MEQ PO TBCR
40.0000 meq | EXTENDED_RELEASE_TABLET | Freq: Once | ORAL | Status: AC
  Administered 2019-12-04: 40 meq via ORAL
  Filled 2019-12-04: qty 2

## 2019-12-04 NOTE — Discharge Instructions (Addendum)
Please continue your medicines as prescribed Drink plenty of fluids and make sure you are getting foods that are rich in potassium Return to the ER if worsening

## 2019-12-04 NOTE — ED Notes (Signed)
Pt transported to xray 

## 2019-12-05 ENCOUNTER — Other Ambulatory Visit: Payer: Self-pay | Admitting: Internal Medicine

## 2019-12-05 ENCOUNTER — Other Ambulatory Visit: Payer: Self-pay | Admitting: Family Medicine

## 2019-12-05 NOTE — Telephone Encounter (Signed)
Refill request for Gabapentin 300 MG 

## 2019-12-08 ENCOUNTER — Encounter (HOSPITAL_COMMUNITY): Payer: Self-pay | Admitting: Cardiology

## 2019-12-08 ENCOUNTER — Other Ambulatory Visit (HOSPITAL_COMMUNITY): Payer: Medicaid Other

## 2019-12-08 NOTE — Progress Notes (Unsigned)
Patient ID: [name], female   DOB: 1975-12-25, 44 y.o.   MRN: [id number] Verified appointment "no show" status with Marcelino Duster at 11:27am.

## 2019-12-15 ENCOUNTER — Telehealth (HOSPITAL_COMMUNITY): Payer: Self-pay | Admitting: Cardiovascular Disease

## 2019-12-15 NOTE — Telephone Encounter (Signed)
Just an FYI. We have made several attempts to contact this patient including sending a letter to schedule or reschedule their echocardiogram. We will be removing the patient from the echo WQ.   Pt NO SHOWED 05/13 and 12/08/19 and was informed of the NO SHOW policy before rescheduling last echo appt/[facility]    Thank you

## 2020-01-19 ENCOUNTER — Encounter: Payer: Self-pay | Admitting: Family Medicine

## 2020-01-19 ENCOUNTER — Ambulatory Visit (INDEPENDENT_AMBULATORY_CARE_PROVIDER_SITE_OTHER): Payer: Medicaid Other | Admitting: Family Medicine

## 2020-01-19 ENCOUNTER — Encounter (HOSPITAL_COMMUNITY): Payer: Self-pay

## 2020-01-19 ENCOUNTER — Emergency Department (HOSPITAL_COMMUNITY)
Admission: EM | Admit: 2020-01-19 | Discharge: 2020-01-19 | Disposition: A | Discharge status: Home / Self Care | Payer: Medicaid Other | Attending: Emergency Medicine | Admitting: Emergency Medicine

## 2020-01-19 ENCOUNTER — Emergency Department (HOSPITAL_COMMUNITY): Payer: Medicaid Other

## 2020-01-19 ENCOUNTER — Other Ambulatory Visit: Payer: Self-pay

## 2020-01-19 VITALS — BP 165/103 | HR 88 | Temp 97.9°F | Resp 17 | Ht 64.0 in | Wt 116.8 lb

## 2020-01-19 DIAGNOSIS — Z9181 History of falling: Secondary | ICD-10-CM | POA: Diagnosis not present

## 2020-01-19 DIAGNOSIS — W010XXA Fall on same level from slipping, tripping and stumbling without subsequent striking against object, initial encounter: Secondary | ICD-10-CM | POA: Diagnosis not present

## 2020-01-19 DIAGNOSIS — I10 Essential (primary) hypertension: Secondary | ICD-10-CM | POA: Diagnosis not present

## 2020-01-19 DIAGNOSIS — M5442 Lumbago with sciatica, left side: Secondary | ICD-10-CM | POA: Diagnosis not present

## 2020-01-19 DIAGNOSIS — Z09 Encounter for follow-up examination after completed treatment for conditions other than malignant neoplasm: Secondary | ICD-10-CM

## 2020-01-19 DIAGNOSIS — Z79899 Other long term (current) drug therapy: Secondary | ICD-10-CM | POA: Diagnosis not present

## 2020-01-19 DIAGNOSIS — Y939 Activity, unspecified: Secondary | ICD-10-CM | POA: Insufficient documentation

## 2020-01-19 DIAGNOSIS — Z0000 Encounter for general adult medical examination without abnormal findings: Secondary | ICD-10-CM | POA: Diagnosis not present

## 2020-01-19 DIAGNOSIS — W19XXXA Unspecified fall, initial encounter: Secondary | ICD-10-CM

## 2020-01-19 DIAGNOSIS — M545 Low back pain, unspecified: Secondary | ICD-10-CM

## 2020-01-19 DIAGNOSIS — M533 Sacrococcygeal disorders, not elsewhere classified: Secondary | ICD-10-CM | POA: Diagnosis present

## 2020-01-19 DIAGNOSIS — I110 Hypertensive heart disease with heart failure: Secondary | ICD-10-CM | POA: Diagnosis not present

## 2020-01-19 DIAGNOSIS — Y9289 Other specified places as the place of occurrence of the external cause: Secondary | ICD-10-CM | POA: Insufficient documentation

## 2020-01-19 DIAGNOSIS — F17210 Nicotine dependence, cigarettes, uncomplicated: Secondary | ICD-10-CM | POA: Diagnosis not present

## 2020-01-19 DIAGNOSIS — Y999 Unspecified external cause status: Secondary | ICD-10-CM | POA: Insufficient documentation

## 2020-01-19 DIAGNOSIS — I160 Hypertensive urgency: Secondary | ICD-10-CM | POA: Insufficient documentation

## 2020-01-19 DIAGNOSIS — I5042 Chronic combined systolic (congestive) and diastolic (congestive) heart failure: Secondary | ICD-10-CM | POA: Diagnosis not present

## 2020-01-19 LAB — POCT URINALYSIS DIPSTICK
Bilirubin, UA: NEGATIVE
Blood, UA: NEGATIVE
Glucose, UA: NEGATIVE
Ketones, UA: NEGATIVE
Nitrite, UA: NEGATIVE
Protein, UA: NEGATIVE
Spec Grav, UA: 1.02 (ref 1.010–1.025)
Urobilinogen, UA: 0.2 E.U./dL
pH, UA: 7 (ref 5.0–8.0)

## 2020-01-19 LAB — POCT GLYCOSYLATED HEMOGLOBIN (HGB A1C)
HbA1c POC (<> result, manual entry): 4.7 % (ref 4.0–5.6)
HbA1c, POC (controlled diabetic range): 4.7 % (ref 0.0–7.0)
HbA1c, POC (prediabetic range): 4.7 % — AB (ref 5.7–6.4)
Hemoglobin A1C: 4.7 % (ref 4.0–5.6)

## 2020-01-19 LAB — GLUCOSE, POCT (MANUAL RESULT ENTRY): POC Glucose: 108 mg/dl — AB (ref 70–99)

## 2020-01-19 MED ORDER — GABAPENTIN 300 MG PO CAPS: ORAL_CAPSULE | ORAL | 3 refills | Status: DC

## 2020-01-19 MED ORDER — OXYCODONE-ACETAMINOPHEN 5-325 MG PO TABS: 1.0000 | ORAL_TABLET | Freq: Three times a day (TID) | ORAL | 0 refills | Status: AC | PRN

## 2020-01-19 MED ORDER — HYDRALAZINE HCL 25 MG PO TABS: 25.0000 mg | ORAL_TABLET | Freq: Three times a day (TID) | ORAL | 3 refills | Status: DC

## 2020-01-19 MED ORDER — HYDROCODONE-ACETAMINOPHEN 5-325 MG PO TABS
1.0000 | ORAL_TABLET | Freq: Once | ORAL | Status: AC
  Administered 2020-01-19: 1 via ORAL
  Filled 2020-01-19: qty 1

## 2020-01-19 MED ORDER — BUPROPION HCL ER (SR) 150 MG PO TB12: 150.0000 mg | ORAL_TABLET | Freq: Two times a day (BID) | ORAL | 3 refills | Status: DC

## 2020-01-19 MED ORDER — METHOCARBAMOL 500 MG PO TABS
500.0000 mg | ORAL_TABLET | Freq: Two times a day (BID) | ORAL | 0 refills | Status: DC
Start: 2020-01-19 — End: 2021-12-19

## 2020-01-19 MED ORDER — CARVEDILOL 25 MG PO TABS: 25.0000 mg | ORAL_TABLET | Freq: Two times a day (BID) | ORAL | 3 refills | Status: DC

## 2020-01-19 MED ORDER — BUDESONIDE-FORMOTEROL FUMARATE 80-4.5 MCG/ACT IN AERO: 2.0000 | INHALATION_SPRAY | Freq: Two times a day (BID) | RESPIRATORY_TRACT | 11 refills | Status: DC

## 2020-01-19 MED ORDER — LIDOCAINE 5 % EX PTCH: 1.0000 | MEDICATED_PATCH | CUTANEOUS | 0 refills | Status: DC

## 2020-01-19 MED ORDER — CYCLOBENZAPRINE HCL 10 MG PO TABS: 10.0000 mg | ORAL_TABLET | Freq: Two times a day (BID) | ORAL | 3 refills | Status: DC | PRN

## 2020-01-19 MED ORDER — ALBUTEROL SULFATE HFA 108 (90 BASE) MCG/ACT IN AERS: 2.0000 | INHALATION_SPRAY | RESPIRATORY_TRACT | 11 refills | Status: DC | PRN

## 2020-01-19 NOTE — ED Triage Notes (Signed)
Patient states she slipped on something outside of her house 2 days ago. Patient denies hitting her head or being on blood thinners. Patient c/o coccyx pain and pain increases with ambulation.

## 2020-01-19 NOTE — ED Provider Notes (Signed)
[facility]-EMERGENCY DEPT Provider Note   CSN: [id number] Arrival date & time: 01/19/20  1214    History Chief Complaint  Patient presents with  . Fall   [name] is a 44 y.o. female with past medical history significant for CHF, anemia, hypertension sickle cell trait who presents for evaluation of fall.  Patient states 2 days ago she slipped in the rain landed directly on her gluteus.  She denies hitting her head, LOC or anticoagulation.  Pain located to coccyx and lumbar spine.  No IV drug use, bowel or bladder incontinence, saddle paresthesia.  Pain worse with movement.  Has been using muscle relaxers and ibuprofen without relief.  Denies chance of pregnancy.  No abdominal pain.  No pain to extremities, edema, erythema or warmth.  No headache, lightness, dizziness, nausea, vomiting, abdominal pain, dysuria.  Denies aggravating relieving factors.  Rates her pain a 10/10.  Pain worse with movement.  History obtained from patient and past medical records.  No interpreter used.  HPI     Past Medical History:  Diagnosis Date  . Anemia   . CHF (congestive heart failure) (HCC)   . Common migraine with intractable migraine 01/25/2017  . GERD (gastroesophageal reflux disease)   . Hypertension   . Miscarriage    x3  . Sickle cell trait (HCC)   . Vitamin D deficiency 07/2019    Patient Active Problem List   Diagnosis Date Noted  . Bilateral leg cramps 12/19/2018  . Vitamin D deficiency 12/19/2018  . Chest discomfort 11/14/2018  . Wrist strain, right, initial encounter 11/14/2018  . Common migraine with intractable migraine 01/25/2017  . Hypertension 12/27/2016  . Chronic combined systolic and diastolic congestive heart failure (HCC) 11/09/2016  . Chest pain 11/08/2016  . Hypertensive urgency 11/08/2016  . Admission for sterilization 08/17/2012  . Spontaneous vaginal delivery 08/16/2012  . Consultation for sterilization 07/25/2012  . Poor fetal growth,  affecting management of mother, antepartum condition or complication 07/11/2012  . Chronic hypertension complicating or reason for care during pregnancy 04/30/2012  . Supervision of high-risk pregnancy 04/30/2012  . Sickle cell trait (HCC) 04/30/2012    Past Surgical History:  Procedure Laterality Date  . BREAST SURGERY     cyst removal 1990  . DILATION AND CURETTAGE OF UTERUS     four miscarriages  . TUBAL LIGATION Bilateral 08/17/2012   Procedure: POST PARTUM TUBAL LIGATION;  Surgeon: Allie Bossier, MD;  Location: [facility];  Service: Gynecology;  Laterality: Bilateral;     OB History    Gravida  5   Para  2   Term  2   Preterm  0   AB  3   Living  2     SAB  3   TAB  0   Ectopic  0   Multiple  0   Live Births  2           Family History  Problem Relation Age of Onset  . Hypertension Mother   . Hypertension Maternal Grandmother   . Hypertension Maternal Grandfather   . Other Neg Hx     Social History   Tobacco Use  . Smoking status: Current Every Day Smoker    Packs/day: 0.30    Years: 10.00    Pack years: 3.00    Types: Cigarettes  . Smokeless tobacco: Never Used  Vaping Use  . Vaping Use: Never used  Substance Use Topics  . 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 Drug use: No    Home Medications Prior to Admission medications   Medication Sig Start Date End Date Taking? Authorizing Provider  albuterol (VENTOLIN HFA) 108 (90 Base) MCG/ACT inhaler Inhale 2 puffs into the lungs every 4 (four) hours as needed for wheezing or shortness of breath (cough, shortness of breath or wheezing.). 01/19/20   Kallie Locks, FNP  budesonide-formoterol (SYMBICORT) 80-4.5 MCG/ACT inhaler Inhale 2 puffs into the lungs 2 (two) times daily. 01/19/20   Kallie Locks, FNP  buPROPion (WELLBUTRIN SR) 150 MG 12 hr tablet Take 1 tablet (150 mg total) by mouth 2 (two) times daily. 01/19/20   Kallie Locks, FNP  carvedilol (COREG)  25 MG tablet Take 1 tablet (25 mg total) by mouth 2 (two) times daily with a meal. 01/19/20 01/13/21  Kallie Locks, FNP  cyclobenzaprine (FLEXERIL) 10 MG tablet Take 1 tablet (10 mg total) by mouth 2 (two) times daily as needed for muscle spasms. 01/19/20   Kallie Locks, FNP  famotidine (PEPCID) 20 MG tablet Take 1 tablet (20 mg total) by mouth 2 (two) times daily. 07/21/19   Kallie Locks, FNP  furosemide (LASIX) 20 MG tablet Take 1 tablet (20 mg total) by mouth daily. 09/02/19 12/01/19  Rosalio Macadamia, NP  gabapentin (NEURONTIN) 300 MG capsule TAKE 1 CAPSULE BY MOUTH THREE TIMES A DAY 01/19/20   Kallie Locks, FNP  hydrALAZINE (APRESOLINE) 25 MG tablet Take 1 tablet (25 mg total) by mouth 3 (three) times daily. 01/19/20 01/13/21  Kallie Locks, FNP  ibuprofen (ADVIL) 800 MG tablet Take 1 tablet (800 mg total) by mouth every 8 (eight) hours as needed. 07/21/19   Kallie Locks, FNP  lidocaine (LIDODERM) 5 % Place 1 patch onto the skin daily. Remove & Discard patch within 12 hours or as directed by MD 01/19/20   Estefano Victory A, PA-C  methocarbamol (ROBAXIN) 500 MG tablet Take 1 tablet (500 mg total) by mouth 2 (two) times daily. 01/19/20   Lucille Crichlow A, PA-C  nitroGLYCERIN (NITROSTAT) 0.3 MG SL tablet Place 1 tablet (0.3 mg total) under the tongue every 5 (five) minutes as needed for chest pain. Patient not taking: Reported on 01/19/2020 11/14/18   Kallie Locks, FNP  oxyCODONE-acetaminophen (PERCOCET) 5-325 MG tablet Take 1 tablet by mouth every 8 (eight) hours as needed for severe pain. 01/19/20 02/18/20  Kallie Locks, FNP  RESTASIS 0.05 % ophthalmic emulsion Place 1 drop into both eyes as directed. 11/13/18   Kallie Locks, FNP  sacubitril-valsartan (ENTRESTO) 97-103 MG Take 1 tablet by mouth 2 (two) times daily. 02/03/19   Kallie Locks, FNP  Vitamin D, Ergocalciferol, (DRISDOL) 1.25 MG (50000 UNIT) CAPS capsule Take 1 capsule (50,000 Units total) by mouth every 7  (seven) days. 07/24/19   Kallie Locks, FNP    Allergies    Patient has no known allergies.  Review of Systems   Review of Systems  Constitutional: Negative.   HENT: Negative.   Respiratory: Negative.   Cardiovascular: Negative.   Gastrointestinal: Negative.   Genitourinary: Negative.   Musculoskeletal: Positive for back pain (Sacral pain) and gait problem. Negative for arthralgias, joint swelling, myalgias, neck pain and neck stiffness.  Skin: Negative.   All other systems reviewed and are negative.  Physical Exam Updated Vital Signs BP (!) 165/107 (BP Location: Left Arm)   Pulse 78   Temp 98.4 F (36.9  C) (Oral)   Resp (!) 30   Ht 5\' 4"  (1.626 m)   Wt 54.4 kg   LMP 01/12/2020 (Approximate)   SpO2 100%   BMI 20.60 kg/m   Physical Exam Vitals and nursing note reviewed.  Constitutional:      General: She is not in acute distress.    Appearance: She is well-developed. She is not ill-appearing, toxic-appearing or diaphoretic.  HENT:     Head: Normocephalic and atraumatic.     Nose: Nose normal.     Mouth/Throat:     Mouth: Mucous membranes are moist.  Eyes:     Pupils: Pupils are equal, round, and reactive to light.  Cardiovascular:     Rate and Rhythm: Normal rate.     Pulses: Normal pulses.          Radial pulses are 2+ on the right side and 2+ on the left side.       Dorsalis pedis pulses are 2+ on the right side and 2+ on the left side.     Heart sounds: Normal heart sounds.  Pulmonary:     Effort: Pulmonary effort is normal. No respiratory distress.     Breath sounds: Normal breath sounds.  Abdominal:     General: Bowel sounds are normal. There is no distension.     Tenderness: There is no abdominal tenderness. There is no right CVA tenderness, left CVA tenderness, guarding or rebound.  Musculoskeletal:     Right shoulder: Normal.     Left shoulder: Normal.     Right upper arm: Normal.     Left upper arm: Normal.     Right elbow: Normal.     Left  elbow: Normal.     Right forearm: Normal.     Left forearm: Normal.     Right wrist: Normal.     Left wrist: Normal.     Cervical back: Normal and normal range of motion.     Thoracic back: Normal.     Lumbar back: Tenderness and bony tenderness present. No swelling, edema, deformity, lacerations or spasms. Normal range of motion. Positive left straight leg raise test. Negative right straight leg raise test. No scoliosis.       Back:     Right hip: Normal.     Left hip: Normal.     Right upper leg: Normal.     Left upper leg: Normal.     Right knee: Normal.     Left knee: Normal.     Right lower leg: Normal.     Left lower leg: Normal.     Right ankle: Normal.     Left ankle: Normal.     Right foot: Normal.     Left foot: Normal.     Comments: Tenderness over central lumbar spine as well as into left sacrum.  Skin:    General: Skin is warm and dry.     Capillary Refill: Capillary refill takes less than 2 seconds.     Comments: No edema, erythema or warmth.  No ecchymosis.  Neurological:     General: No focal deficit present.     Mental Status: She is alert.     Cranial Nerves: Cranial nerves are intact.     Sensory: Sensation is intact.     Motor: Motor function is intact.     Coordination: Coordination is intact.     Gait: Gait is intact.     Comments: Ambulatory without difficulty 5/5 strength bilateral lower  extremities Intact sensation Equal hand grip bilaterally CN 2-1 grossly intact No facial droop Clear speech    ED Results / Procedures / Treatments   Labs (all labs ordered are listed, but only abnormal results are displayed) Labs Reviewed - No data to display  EKG None  Radiology DG Lumbar Spine Complete  Result Date: 01/19/2020 CLINICAL DATA:  Pain status post fall. EXAM: LUMBAR SPINE - COMPLETE 4+ VIEW; SACRUM AND COCCYX - 2+ VIEW COMPARISON:  None. FINDINGS: There is no evidence of lumbar spine fracture. Alignment is normal. Intervertebral disc spaces  are maintained. There is a metallic foreign body projecting over the left inguinal region which is presumably external to the patient. There is no acute displaced fracture involving the coccyx. IMPRESSION: Negative. Electronically Signed   By: Katherine Mantle M.D.   On: 01/19/2020 19:22   DG Sacrum/Coccyx  Result Date: 01/19/2020 CLINICAL DATA:  Pain status post fall. EXAM: LUMBAR SPINE - COMPLETE 4+ VIEW; SACRUM AND COCCYX - 2+ VIEW COMPARISON:  None. FINDINGS: There is no evidence of lumbar spine fracture. Alignment is normal. Intervertebral disc spaces are maintained. There is a metallic foreign body projecting over the left inguinal region which is presumably external to the patient. There is no acute displaced fracture involving the coccyx. IMPRESSION: Negative. Electronically Signed   By: Katherine Mantle M.D.   On: 01/19/2020 19:22    Procedures Procedures (including critical care time)  Medications Ordered in ED Medications  HYDROcodone-acetaminophen (NORCO/VICODIN) 5-325 MG per tablet 1 tablet (1 tablet Oral Given 01/19/20 1757)    ED Course  I have reviewed the triage vital signs and the nursing notes.  Pertinent labs & imaging results that were available during my care of the patient were reviewed by me and considered in my medical decision making (see chart for details).  66 old female presents for evaluation of sacral lower back pain after mechanical fall 2 days ago.  Pain worse with ambulation.  She is neurovascularly intact.  She has no overlying skin changes.  I am able to reproduce her pain to sacral as well as over lumbar region.  He has positive straight leg raise on left.  Heart and lungs clear.  On initial evaluation she was tachypneic respiratory rate into the 30s with triage however this is resolved on my evaluation.  She is hypertensive however states her pain is a 10/10.  She is compliant with her blood pressure medications.  She denies any chest pain, shortness of  breath.  No clinical evidence of DVT on exam.  Will obtain imaging, pain management, reassess  Imaging personally viewed interpreted:  DG lumber without acute findings DG Sacrum without acute findings  Patient reassessed. Pain controlled. Discussed imaging findings. Suspect MSK pain. Received Hydrocodone RX from PCP earlier today.  Discussed addition of lidocaine patches, anti-inflammatories.  She followed PCP for symptoms are unresolved.  Low suspicion for acute neurosurgical emergency such as cauda equina, discitis, osteomyelitis, transverse myelitis, psoas abscess, acute bacterial infectious process, VTE.  The patient has been appropriately medically screened and/or stabilized in the ED. I have low suspicion for any other emergent medical condition which would require further screening, evaluation or treatment in the ED or require inpatient management.  Patient is hemodynamically stable and in no acute distress.  Patient able to ambulate in department prior to ED.  Evaluation does not show acute pathology that would require ongoing or additional emergent interventions while in the emergency department or further inpatient treatment.  I have  discussed the diagnosis with the patient and answered all questions.  Pain is been managed while in the emergency department and patient has no further complaints prior to discharge.  Patient is comfortable with plan discussed in room and is stable for discharge at this time.  I have discussed strict return precautions for returning to the emergency department.  Patient was encouraged to follow-up with PCP/specialist refer to at discharge.    MDM Rules/Calculators/A&P                           Final Clinical Impression(s) / ED Diagnoses Final diagnoses:  Fall, initial encounter  Acute bilateral low back pain with left-sided sciatica    Rx / DC Orders ED Discharge Orders         Ordered    lidocaine (LIDODERM) 5 %  Every 24 hours     Discontinue   Reprint     01/19/20 1932    methocarbamol (ROBAXIN) 500 MG tablet  2 times daily     Discontinue  Reprint     01/19/20 1932           Brennen Camper A, PA-C 01/19/20 1934    Sabino DonovanKatz, Eric C, MD 01/19/20 2302

## 2020-01-19 NOTE — Discharge Instructions (Addendum)
Take the hydrocodone prescribed to you by your primary care.  I have added in lidocaine patches, additional muscle relaxers.  You may take anti-inflammatory such as ibuprofen at home.  May place ice or heat to the area.  Follow-up with your primary care if symptoms are unresolved.

## 2020-01-19 NOTE — Progress Notes (Signed)
[facility]    Established Patient Office Visit  Subjective:  Patient ID: [name], female    DOB: 08/07/1975  Age: 44 y.o. MRN: [id number]  CC:  Chief Complaint  Patient presents with  . Follow-up    Pt states she is here for a f/u. Pt states in here R arm is a numbing achy feeling. Pt states she can't left her arm over her head  w/o any pain happen X3 1/2 wks. pt states her pain level is a 8. Pt also states she fell 01/17/20 on her buttocks and it hurts to walk or cough.    HPI [name] is a 44 year old female who presents for Hospital Follow Up today.   Past Medical History:  Diagnosis Date  . Anemia   . CHF (congestive heart failure) (HCC)   . Common migraine with intractable migraine 01/25/2017  . GERD (gastroesophageal reflux disease)   . Hypertension   . Miscarriage    x3  . Sickle cell trait (HCC)   . Vitamin D deficiency 07/2019   Current Status: Since her last office visit, she has c/o of increasing lower back pain, r/t recent fall on 01/17/2020. She states that she slipped on slick substance in driveway and fell directly on both hips. She reports that back pain is getting worse daily. She has taken Motrin and Flexeril with no relief. She denies fevers, chills, fatigue, recent infections, weight loss, and night sweats. She has not had any headaches, visual changes, dizziness, and falls. No chest pain, heart palpitations, cough and shortness of breath reported. Denies GI problems such as nausea, vomiting, diarrhea, and constipation. She has no reports of blood in stools, dysuria and hematuria. Her anxiety is increased r/t back/hip pain from fall. She is taking all medications as prescribed.   Past Surgical History:  Procedure Laterality Date  . BREAST SURGERY     cyst removal 1990  . DILATION AND CURETTAGE OF UTERUS     four miscarriages  . TUBAL LIGATION Bilateral 08/17/2012   Procedure: POST PARTUM TUBAL  LIGATION;  Surgeon: Allie Bossier, MD;  Location: [facility];  Service: Gynecology;  Laterality: Bilateral;    Family History  Problem Relation Age of Onset  . Hypertension Mother   . Hypertension Maternal Grandmother   . Hypertension Maternal Grandfather   . Other Neg Hx     Social History   Socioeconomic History  . Marital status: Single    Spouse name: Not on file  . Number of children: 2  . Years of education: 5  . Highest education level: Not on file  Occupational History  . Occupation: Unemployed  Tobacco Use  . Smoking status: Current Every Day Smoker    Packs/day: 0.30    Years: 10.00    Pack years: 3.00    Types: Cigarettes  . Smokeless tobacco: Never Used  Vaping Use  . Vaping Use: Never used  Substance and Sexual Activity  . 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 Drug use: No  . Sexual activity: Yes    Birth control/protection: None  Other Topics Concern  . Not on file  Social History Narrative   Lives with mother, Elease Hashimoto   Caffeine use: Tea daily   Left handed   Social Determinants of Health   Financial Resource Strain:   . Difficulty of Paying Living Expenses:   Food Insecurity:   .  Worried About Programme researcher, broadcasting/film/video in the Last Year:   . Barista in the Last Year:   Transportation Needs:   . Freight forwarder (Medical):   Marland Kitchen Lack of Transportation (Non-Medical):   Physical Activity:   . Days of Exercise per Week:   . Minutes of Exercise per Session:   Stress:   . Feeling of Stress :   Social Connections:   . Frequency of Communication with Friends and Family:   . Frequency of Social Gatherings with Friends and Family:   . Attends Religious Services:   . Active Member of Clubs or Organizations:   . Attends Banker Meetings:   Marland Kitchen Marital Status:   Intimate Partner Violence:   . Fear of Current or Ex-Partner:   . Emotionally Abused:   Marland Kitchen Physically Abused:   . Sexually Abused:      Outpatient Medications Prior to Visit  Medication Sig Dispense Refill  . famotidine (PEPCID) 20 MG tablet Take 1 tablet (20 mg total) by mouth 2 (two) times daily. 60 tablet 6  . ibuprofen (ADVIL) 800 MG tablet Take 1 tablet (800 mg total) by mouth every 8 (eight) hours as needed. 30 tablet 2  . RESTASIS 0.05 % ophthalmic emulsion Place 1 drop into both eyes as directed. 0.4 mL 3  . sacubitril-valsartan (ENTRESTO) 97-103 MG Take 1 tablet by mouth 2 (two) times daily. 180 tablet 2  . Vitamin D, Ergocalciferol, (DRISDOL) 1.25 MG (50000 UNIT) CAPS capsule Take 1 capsule (50,000 Units total) by mouth every 7 (seven) days. 5 capsule 6  . albuterol (VENTOLIN HFA) 108 (90 Base) MCG/ACT inhaler Inhale 2 puffs into the lungs every 4 (four) hours as needed for wheezing or shortness of breath (cough, shortness of breath or wheezing.). 1 Inhaler 12  . budesonide-formoterol (SYMBICORT) 80-4.5 MCG/ACT inhaler Inhale 2 puffs into the lungs 2 (two) times daily. 1 Inhaler 6  . buPROPion (WELLBUTRIN SR) 150 MG 12 hr tablet Take 1 tablet (150 mg total) by mouth 2 (two) times daily. 60 tablet 6  . carvedilol (COREG) 25 MG tablet Take 1 tablet (25 mg total) by mouth 2 (two) times daily with a meal. 60 tablet 6  . cyclobenzaprine (FLEXERIL) 10 MG tablet Take 1 tablet (10 mg total) by mouth 2 (two) times daily as needed for muscle spasms. 30 tablet 3  . gabapentin (NEURONTIN) 300 MG capsule TAKE 1 CAPSULE BY MOUTH THREE TIMES A DAY 90 capsule 6  . hydrALAZINE (APRESOLINE) 25 MG tablet Take 1 tablet (25 mg total) by mouth 3 (three) times daily. 90 tablet 6  . furosemide (LASIX) 20 MG tablet Take 1 tablet (20 mg total) by mouth daily. 10 tablet 0  . nitroGLYCERIN (NITROSTAT) 0.3 MG SL tablet Place 1 tablet (0.3 mg total) under the tongue every 5 (five) minutes as needed for chest pain. (Patient not taking: Reported on 01/19/2020) 90 tablet 3   No facility-administered medications prior to visit.    No Known  Allergies  ROS Review of Systems  Constitutional: Negative.   HENT: Negative.   Eyes: Negative.   Respiratory: Negative.   Cardiovascular: Negative.   Gastrointestinal: Negative.   Endocrine: Negative.   Genitourinary: Negative.   Musculoskeletal: Positive for back pain (acute; r/t recent fall).  Skin: Negative.   Allergic/Immunologic: Negative.   Neurological: Positive for dizziness (occasional) and headaches (occasional).  Hematological: Negative.   Psychiatric/Behavioral: The patient is nervous/anxious.       Objective:  Physical Exam Vitals and nursing note reviewed.  Constitutional:      Appearance: Normal appearance.  HENT:     Head: Normocephalic and atraumatic.     Nose: Nose normal.     Mouth/Throat:     Mouth: Mucous membranes are moist.     Pharynx: Oropharynx is clear.  Cardiovascular:     Rate and Rhythm: Normal rate and regular rhythm.     Pulses: Normal pulses.     Heart sounds: Normal heart sounds.  Pulmonary:     Effort: Pulmonary effort is normal.     Breath sounds: Normal breath sounds.  Abdominal:     General: Bowel sounds are normal.     Palpations: Abdomen is soft.  Musculoskeletal:        General: Normal range of motion.     Cervical back: Normal range of motion and neck supple.  Skin:    General: Skin is warm and dry.  Neurological:     General: No focal deficit present.     Mental Status: She is alert and oriented to person, place, and time.  Psychiatric:        Mood and Affect: Mood normal.        Behavior: Behavior normal.        Thought Content: Thought content normal.        Judgment: Judgment normal.     BP (!) 165/103 (BP Location: Left Arm, Patient Position: Sitting, Cuff Size: Normal)   Pulse 88   Temp 97.9 F (36.6 C)   Resp 17   Ht 5\' 4"  (1.626 m)   Wt 116 lb 12.8 oz (53 kg)   LMP 01/12/2020 (Approximate)   SpO2 100%   BMI 20.05 kg/m  Wt Readings from Last 3 Encounters:  01/19/20 120 lb (54.4 kg)  01/19/20  116 lb 12.8 oz (53 kg)  12/03/19 121 lb 4.1 oz (55 kg)     Health Maintenance Due  Topic Date Due  . Hepatitis C Screening  Never done  . COVID-19 Vaccine (1) Never done  . PAP SMEAR-Modifier  Never done    There are no preventive care reminders to display for this patient.  Lab Results  Component Value Date   TSH 1.540 01/19/2020   Lab Results  Component Value Date   WBC 6.6 01/19/2020   HGB 12.4 01/19/2020   HCT 38.9 01/19/2020   MCV 93 01/19/2020   PLT 274 01/19/2020   Lab Results  Component Value Date   NA 139 01/19/2020   K 4.2 01/19/2020   CO2 22 01/19/2020   GLUCOSE 90 01/19/2020   BUN 14 01/19/2020   CREATININE 0.77 01/19/2020   BILITOT 0.4 01/19/2020   ALKPHOS 77 01/19/2020   AST 31 01/19/2020   ALT 35 (H) 01/19/2020   PROT 7.0 01/19/2020   ALBUMIN 4.2 01/19/2020   CALCIUM 9.6 01/19/2020   ANIONGAP 18 (H) 12/03/2019   Lab Results  Component Value Date   CHOL 179 01/19/2020   Lab Results  Component Value Date   HDL 84 01/19/2020   Lab Results  Component Value Date   LDLCALC 80 01/19/2020   Lab Results  Component Value Date   TRIG 80 01/19/2020   Lab Results  Component Value Date   CHOLHDL 2.1 01/19/2020   Lab Results  Component Value Date   HGBA1C 4.7 01/19/2020   HGBA1C 4.7 01/19/2020   HGBA1C 4.7 (A) 01/19/2020   HGBA1C 4.7 01/19/2020    Assessment & Plan:  1. Acute bilateral low back pain without sciatica  2. Acute midline low back pain without sciatica We will initiate pain medication at this time. Patient to get scans today. - oxyCODONE-acetaminophen (PERCOCET) 5-325 MG tablet; Take 1 tablet by mouth every 8 (eight) hours as needed for severe pain.  Dispense: 30 tablet; Refill: 0 - DG Lumbar Spine 2-3 Views; Future - DG HIPS BILAT WITH PELVIS 3-4 VIEWS  3. Essential hypertension Blood pressures are elevated probable r/t increased back pain; current current medical regimen is effective; continue present plan and  medications as prescribed.  - POC Glucose (CBG) - POC HgB A1c - Urinalysis Dipstick  4. History of recent fall  5. Healthcare maintenance - CBC with Differential - Comprehensive metabolic panel - TSH - Lipid Panel - Vitamin B12 - Vitamin D, 25-hydroxy  6. Follow up She will follow up in 3 months.   Meds ordered this encounter  Medications  . oxyCODONE-acetaminophen (PERCOCET) 5-325 MG tablet    Sig: Take 1 tablet by mouth every 8 (eight) hours as needed for severe pain.    Dispense:  30 tablet    Refill:  0  . albuterol (VENTOLIN HFA) 108 (90 Base) MCG/ACT inhaler    Sig: Inhale 2 puffs into the lungs every 4 (four) hours as needed for wheezing or shortness of breath (cough, shortness of breath or wheezing.).    Dispense:  18 g    Refill:  11  . budesonide-formoterol (SYMBICORT) 80-4.5 MCG/ACT inhaler    Sig: Inhale 2 puffs into the lungs 2 (two) times daily.    Dispense:  1 Inhaler    Refill:  11  . buPROPion (WELLBUTRIN SR) 150 MG 12 hr tablet    Sig: Take 1 tablet (150 mg total) by mouth 2 (two) times daily.    Dispense:  180 tablet    Refill:  3  . carvedilol (COREG) 25 MG tablet    Sig: Take 1 tablet (25 mg total) by mouth 2 (two) times daily with a meal.    Dispense:  180 tablet    Refill:  3  . cyclobenzaprine (FLEXERIL) 10 MG tablet    Sig: Take 1 tablet (10 mg total) by mouth 2 (two) times daily as needed for muscle spasms.    Dispense:  30 tablet    Refill:  3  . gabapentin (NEURONTIN) 300 MG capsule    Sig: TAKE 1 CAPSULE BY MOUTH THREE TIMES A DAY    Dispense:  270 capsule    Refill:  3  . hydrALAZINE (APRESOLINE) 25 MG tablet    Sig: Take 1 tablet (25 mg total) by mouth 3 (three) times daily.    Dispense:  270 tablet    Refill:  3    Orders Placed This Encounter  Procedures  . DG Lumbar Spine 2-3 Views  . DG HIPS BILAT WITH PELVIS 3-4 VIEWS  . CBC with Differential  . Comprehensive metabolic panel  . TSH  . Lipid Panel  . Vitamin B12  .  Vitamin D, 25-hydroxy  . POC Glucose (CBG)  . POC HgB A1c  . Urinalysis Dipstick    Referral Orders  No referral(s) requested today    Raliegh Ip,  MSN, FNP-BC [facility] [address] [place], [place] [address] [phone] fax  Problem List Items Addressed This Visit      Cardiovascular and Mediastinum   Hypertension   Relevant Medications  carvedilol (COREG) 25 MG tablet   hydrALAZINE (APRESOLINE) 25 MG tablet   Other Relevant Orders   POC Glucose (CBG) (Completed)   POC HgB A1c (Completed)   Urinalysis Dipstick (Completed)    Other Visit Diagnoses    Acute bilateral low back pain without sciatica    -  Primary   Relevant Medications   oxyCODONE-acetaminophen (PERCOCET) 5-325 MG tablet   cyclobenzaprine (FLEXERIL) 10 MG tablet   Acute midline low back pain without sciatica       Relevant Medications   oxyCODONE-acetaminophen (PERCOCET) 5-325 MG tablet   cyclobenzaprine (FLEXERIL) 10 MG tablet   Other Relevant Orders   DG Lumbar Spine 2-3 Views   DG HIPS BILAT WITH PELVIS 3-4 VIEWS   History of recent fall       Healthcare maintenance       Relevant Orders   CBC with Differential (Completed)   Comprehensive metabolic panel (Completed)   TSH (Completed)   Lipid Panel (Completed)   Vitamin B12 (Completed)   Vitamin D, 25-hydroxy (Completed)   Follow up          Meds ordered this encounter  Medications  . oxyCODONE-acetaminophen (PERCOCET) 5-325 MG tablet    Sig: Take 1 tablet by mouth every 8 (eight) hours as needed for severe pain.    Dispense:  30 tablet    Refill:  0  . albuterol (VENTOLIN HFA) 108 (90 Base) MCG/ACT inhaler    Sig: Inhale 2 puffs into the lungs every 4 (four) hours as needed for wheezing or shortness of breath (cough, shortness of breath or wheezing.).    Dispense:  18 g    Refill:  11  . budesonide-formoterol (SYMBICORT) 80-4.5  MCG/ACT inhaler    Sig: Inhale 2 puffs into the lungs 2 (two) times daily.    Dispense:  1 Inhaler    Refill:  11  . buPROPion (WELLBUTRIN SR) 150 MG 12 hr tablet    Sig: Take 1 tablet (150 mg total) by mouth 2 (two) times daily.    Dispense:  180 tablet    Refill:  3  . carvedilol (COREG) 25 MG tablet    Sig: Take 1 tablet (25 mg total) by mouth 2 (two) times daily with a meal.    Dispense:  180 tablet    Refill:  3  . cyclobenzaprine (FLEXERIL) 10 MG tablet    Sig: Take 1 tablet (10 mg total) by mouth 2 (two) times daily as needed for muscle spasms.    Dispense:  30 tablet    Refill:  3  . gabapentin (NEURONTIN) 300 MG capsule    Sig: TAKE 1 CAPSULE BY MOUTH THREE TIMES A DAY    Dispense:  270 capsule    Refill:  3  . hydrALAZINE (APRESOLINE) 25 MG tablet    Sig: Take 1 tablet (25 mg total) by mouth 3 (three) times daily.    Dispense:  270 tablet    Refill:  3    Follow-up: No follow-ups on file.    Kallie Locks, FNP

## 2020-01-20 LAB — VITAMIN D 25 HYDROXY (VIT D DEFICIENCY, FRACTURES): Vit D, 25-Hydroxy: 40.5 ng/mL (ref 30.0–100.0)

## 2020-01-20 LAB — CBC WITH DIFFERENTIAL/PLATELET
Basophils Absolute: 0.1 10*3/uL (ref 0.0–0.2)
Basos: 1 %
EOS (ABSOLUTE): 0.2 10*3/uL (ref 0.0–0.4)
Eos: 2 %
Hematocrit: 38.9 % (ref 34.0–46.6)
Hemoglobin: 12.4 g/dL (ref 11.1–15.9)
Immature Grans (Abs): 0 10*3/uL (ref 0.0–0.1)
Immature Granulocytes: 0 %
Lymphocytes Absolute: 1.1 10*3/uL (ref 0.7–3.1)
Lymphs: 16 %
MCH: 29.7 pg (ref 26.6–33.0)
MCHC: 31.9 g/dL (ref 31.5–35.7)
MCV: 93 fL (ref 79–97)
Monocytes Absolute: 0.5 10*3/uL (ref 0.1–0.9)
Monocytes: 7 %
Neutrophils Absolute: 4.9 10*3/uL (ref 1.4–7.0)
Neutrophils: 74 %
Platelets: 274 10*3/uL (ref 150–450)
RBC: 4.18 x10E6/uL (ref 3.77–5.28)
RDW: 13.1 % (ref 11.7–15.4)
WBC: 6.6 10*3/uL (ref 3.4–10.8)

## 2020-01-20 LAB — COMPREHENSIVE METABOLIC PANEL
ALT: 35 IU/L — ABNORMAL HIGH (ref 0–32)
AST: 31 IU/L (ref 0–40)
Albumin/Globulin Ratio: 1.5 (ref 1.2–2.2)
Albumin: 4.2 g/dL (ref 3.8–4.8)
Alkaline Phosphatase: 77 IU/L (ref 48–121)
BUN/Creatinine Ratio: 18 (ref 9–23)
BUN: 14 mg/dL (ref 6–24)
Bilirubin Total: 0.4 mg/dL (ref 0.0–1.2)
CO2: 22 mmol/L (ref 20–29)
Calcium: 9.6 mg/dL (ref 8.7–10.2)
Chloride: 104 mmol/L (ref 96–106)
Creatinine, Ser: 0.77 mg/dL (ref 0.57–1.00)
GFR calc Af Amer: 110 mL/min/{1.73_m2} (ref >59)
GFR calc non Af Amer: 96 mL/min/{1.73_m2} (ref >59)
Globulin, Total: 2.8 g/dL (ref 1.5–4.5)
Glucose: 90 mg/dL (ref 65–99)
Potassium: 4.2 mmol/L (ref 3.5–5.2)
Sodium: 139 mmol/L (ref 134–144)
Total Protein: 7 g/dL (ref 6.0–8.5)

## 2020-01-20 LAB — LIPID PANEL
Chol/HDL Ratio: 2.1 ratio (ref 0.0–4.4)
Cholesterol, Total: 179 mg/dL (ref 100–199)
HDL: 84 mg/dL (ref >39)
LDL Chol Calc (NIH): 80 mg/dL (ref 0–99)
Triglycerides: 80 mg/dL (ref 0–149)
VLDL Cholesterol Cal: 15 mg/dL (ref 5–40)

## 2020-01-20 LAB — VITAMIN B12: Vitamin B-12: 392 pg/mL (ref 232–1245)

## 2020-01-20 LAB — TSH: TSH: 1.54 u[IU]/mL (ref 0.450–4.500)

## 2020-01-22 DIAGNOSIS — M545 Low back pain, unspecified: Secondary | ICD-10-CM | POA: Insufficient documentation

## 2020-01-23 NOTE — Progress Notes (Signed)
Pt was called @ 12:27 pm to discuss her labs. Pt stated she understood and will call next week to talk about writing a letter stating she can return to work.

## 2020-04-12 ENCOUNTER — Emergency Department (HOSPITAL_COMMUNITY)
Admission: EM | Admit: 2020-04-12 | Discharge: 2020-04-12 | Disposition: A | Discharge status: Home / Self Care | Payer: Medicaid Other | Attending: Emergency Medicine | Admitting: Emergency Medicine

## 2020-04-12 ENCOUNTER — Emergency Department (HOSPITAL_COMMUNITY): Payer: Medicaid Other

## 2020-04-12 ENCOUNTER — Encounter (HOSPITAL_COMMUNITY): Payer: Self-pay | Admitting: Emergency Medicine

## 2020-04-12 DIAGNOSIS — Z5321 Procedure and treatment not carried out due to patient leaving prior to being seen by health care provider: Secondary | ICD-10-CM | POA: Insufficient documentation

## 2020-04-12 DIAGNOSIS — R059 Cough, unspecified: Secondary | ICD-10-CM | POA: Insufficient documentation

## 2020-04-12 DIAGNOSIS — R079 Chest pain, unspecified: Secondary | ICD-10-CM | POA: Insufficient documentation

## 2020-04-12 LAB — BASIC METABOLIC PANEL
Anion gap: 16 — ABNORMAL HIGH (ref 5–15)
BUN: 11 mg/dL (ref 6–20)
CO2: 20 mmol/L — ABNORMAL LOW (ref 22–32)
Calcium: 9.3 mg/dL (ref 8.9–10.3)
Chloride: 102 mmol/L (ref 98–111)
Creatinine, Ser: 0.79 mg/dL (ref 0.44–1.00)
GFR, Estimated: >60 mL/min (ref >60)
Glucose, Bld: 87 mg/dL (ref 70–99)
Potassium: 4.3 mmol/L (ref 3.5–5.1)
Sodium: 138 mmol/L (ref 135–145)

## 2020-04-12 LAB — CBC
HCT: 39.2 % (ref 36.0–46.0)
Hemoglobin: 12.8 g/dL (ref 12.0–15.0)
MCH: 28.7 pg (ref 26.0–34.0)
MCHC: 32.7 g/dL (ref 30.0–36.0)
MCV: 87.9 fL (ref 80.0–100.0)
Platelets: 287 10*3/uL (ref 150–400)
RBC: 4.46 MIL/uL (ref 3.87–5.11)
RDW: 13.9 % (ref 11.5–15.5)
WBC: 6.5 10*3/uL (ref 4.0–10.5)
nRBC: 0 % (ref 0.0–0.2)

## 2020-04-12 LAB — I-STAT BETA HCG BLOOD, ED (MC, WL, AP ONLY): I-stat hCG, quantitative: <5 m[IU]/mL (ref <5)

## 2020-04-12 LAB — TROPONIN I (HIGH SENSITIVITY): Troponin I (High Sensitivity): 5 ng/L (ref <18)

## 2020-04-12 NOTE — ED Triage Notes (Signed)
Pt arrives to ED via [facility] from work, per ems pt moved a heavy cough last night and now having left side chest pain. Fire gave 324mg  asa. Pt has hx of sickle cell.

## 2020-04-12 NOTE — ED Notes (Signed)
Called several times by registration w/o response.  

## 2020-04-20 ENCOUNTER — Ambulatory Visit: Payer: Self-pay | Admitting: Family Medicine

## 2020-05-13 ENCOUNTER — Telehealth: Payer: Self-pay | Admitting: Family Medicine

## 2020-05-13 NOTE — Telephone Encounter (Signed)
Done

## 2020-09-05 ENCOUNTER — Other Ambulatory Visit: Payer: Self-pay

## 2020-09-05 ENCOUNTER — Emergency Department (HOSPITAL_COMMUNITY): Payer: Medicaid Other

## 2020-09-05 ENCOUNTER — Emergency Department (HOSPITAL_COMMUNITY)
Admission: EM | Admit: 2020-09-05 | Discharge: 2020-09-05 | Disposition: A | Discharge status: Home / Self Care | Payer: Medicaid Other | Attending: Emergency Medicine | Admitting: Emergency Medicine

## 2020-09-05 DIAGNOSIS — I5042 Chronic combined systolic (congestive) and diastolic (congestive) heart failure: Secondary | ICD-10-CM | POA: Insufficient documentation

## 2020-09-05 DIAGNOSIS — R072 Precordial pain: Secondary | ICD-10-CM

## 2020-09-05 DIAGNOSIS — R519 Headache, unspecified: Secondary | ICD-10-CM | POA: Diagnosis present

## 2020-09-05 DIAGNOSIS — F17210 Nicotine dependence, cigarettes, uncomplicated: Secondary | ICD-10-CM | POA: Insufficient documentation

## 2020-09-05 DIAGNOSIS — Z7982 Long term (current) use of aspirin: Secondary | ICD-10-CM | POA: Insufficient documentation

## 2020-09-05 DIAGNOSIS — Z79899 Other long term (current) drug therapy: Secondary | ICD-10-CM | POA: Insufficient documentation

## 2020-09-05 DIAGNOSIS — R000 Tachycardia, unspecified: Secondary | ICD-10-CM | POA: Diagnosis not present

## 2020-09-05 DIAGNOSIS — I110 Hypertensive heart disease with heart failure: Secondary | ICD-10-CM | POA: Diagnosis not present

## 2020-09-05 DIAGNOSIS — I10 Essential (primary) hypertension: Secondary | ICD-10-CM

## 2020-09-05 LAB — BRAIN NATRIURETIC PEPTIDE: B Natriuretic Peptide: 17.9 pg/mL (ref 0.0–100.0)

## 2020-09-05 LAB — COMPREHENSIVE METABOLIC PANEL
ALT: 17 U/L (ref 0–44)
AST: 21 U/L (ref 15–41)
Albumin: 3.5 g/dL (ref 3.5–5.0)
Alkaline Phosphatase: 64 U/L (ref 38–126)
Anion gap: 13 (ref 5–15)
BUN: 12 mg/dL (ref 6–20)
CO2: 19 mmol/L — ABNORMAL LOW (ref 22–32)
Calcium: 8.9 mg/dL (ref 8.9–10.3)
Chloride: 107 mmol/L (ref 98–111)
Creatinine, Ser: 0.6 mg/dL (ref 0.44–1.00)
GFR, Estimated: >60 mL/min (ref >60)
Glucose, Bld: 94 mg/dL (ref 70–99)
Potassium: 3.4 mmol/L — ABNORMAL LOW (ref 3.5–5.1)
Sodium: 139 mmol/L (ref 135–145)
Total Bilirubin: 0.6 mg/dL (ref 0.3–1.2)
Total Protein: 6.5 g/dL (ref 6.5–8.1)

## 2020-09-05 LAB — CBC WITH DIFFERENTIAL/PLATELET
Abs Immature Granulocytes: 0.03 10*3/uL (ref 0.00–0.07)
Basophils Absolute: 0.1 10*3/uL (ref 0.0–0.1)
Basophils Relative: 1 %
Eosinophils Absolute: 0.3 10*3/uL (ref 0.0–0.5)
Eosinophils Relative: 4 %
HCT: 37.4 % (ref 36.0–46.0)
Hemoglobin: 12.4 g/dL (ref 12.0–15.0)
Immature Granulocytes: 1 %
Lymphocytes Relative: 37 %
Lymphs Abs: 2.3 10*3/uL (ref 0.7–4.0)
MCH: 28.8 pg (ref 26.0–34.0)
MCHC: 33.2 g/dL (ref 30.0–36.0)
MCV: 86.8 fL (ref 80.0–100.0)
Monocytes Absolute: 0.4 10*3/uL (ref 0.1–1.0)
Monocytes Relative: 7 %
Neutro Abs: 3.1 10*3/uL (ref 1.7–7.7)
Neutrophils Relative %: 50 %
Platelets: 251 10*3/uL (ref 150–400)
RBC: 4.31 MIL/uL (ref 3.87–5.11)
RDW: 14.1 % (ref 11.5–15.5)
WBC: 6.1 10*3/uL (ref 4.0–10.5)
nRBC: 0 % (ref 0.0–0.2)

## 2020-09-05 LAB — TROPONIN I (HIGH SENSITIVITY)
Troponin I (High Sensitivity): 3 ng/L (ref <18)
Troponin I (High Sensitivity): 5 ng/L (ref <18)

## 2020-09-05 LAB — D-DIMER, QUANTITATIVE: D-Dimer, Quant: 0.51 ug/mL-FEU — ABNORMAL HIGH (ref 0.00–0.50)

## 2020-09-05 LAB — I-STAT BETA HCG BLOOD, ED (MC, WL, AP ONLY): I-stat hCG, quantitative: <5 m[IU]/mL (ref <5)

## 2020-09-05 MED ORDER — SODIUM CHLORIDE 0.9 % IV BOLUS
500.0000 mL | Freq: Once | INTRAVENOUS | Status: AC
  Administered 2020-09-05: 500 mL via INTRAVENOUS

## 2020-09-05 MED ORDER — HYDRALAZINE HCL 50 MG PO TABS
50.0000 mg | ORAL_TABLET | Freq: Once | ORAL | Status: AC
  Administered 2020-09-05: 50 mg via ORAL

## 2020-09-05 MED ORDER — DIPHENHYDRAMINE HCL 50 MG/ML IJ SOLN
12.5000 mg | Freq: Once | INTRAMUSCULAR | Status: AC
  Administered 2020-09-05: 12.5 mg via INTRAVENOUS
  Filled 2020-09-05: qty 1

## 2020-09-05 MED ORDER — HYDRALAZINE HCL 50 MG PO TABS
50.0000 mg | ORAL_TABLET | Freq: Three times a day (TID) | ORAL | Status: DC
  Filled 2020-09-05: qty 1

## 2020-09-05 MED ORDER — MORPHINE SULFATE (PF) 4 MG/ML IV SOLN
4.0000 mg | Freq: Once | INTRAVENOUS | Status: AC
  Administered 2020-09-05: 4 mg via INTRAVENOUS
  Filled 2020-09-05: qty 1

## 2020-09-05 MED ORDER — PROCHLORPERAZINE EDISYLATE 10 MG/2ML IJ SOLN
10.0000 mg | Freq: Once | INTRAMUSCULAR | Status: AC
  Administered 2020-09-05: 10 mg via INTRAVENOUS
  Filled 2020-09-05: qty 2

## 2020-09-05 MED ORDER — IOHEXOL 350 MG/ML SOLN
75.0000 mL | Freq: Once | INTRAVENOUS | Status: AC | PRN
  Administered 2020-09-05: 75 mL via INTRAVENOUS

## 2020-09-05 MED ORDER — ONDANSETRON HCL 4 MG/2ML IJ SOLN
4.0000 mg | Freq: Once | INTRAMUSCULAR | Status: AC
  Administered 2020-09-05: 4 mg via INTRAVENOUS
  Filled 2020-09-05: qty 2

## 2020-09-05 MED ORDER — HYDRALAZINE HCL 25 MG PO TABS: 25.0000 mg | ORAL_TABLET | Freq: Three times a day (TID) | ORAL | Status: DC

## 2020-09-05 NOTE — ED Provider Notes (Signed)
[facility] Provider Note   CSN: [id number] Arrival date & time: 09/05/20  1010    History Chief Complaint  Patient presents with  . Chest Pain  . Shortness of Breath    [name] is a 45 y.o. female with past medical history significant for chronic EtOH use, dilated cardiomyopathy, anemia, GERD, sickle cell trait who presents for evaluation of chest pain and shortness of breath.  Patient states began yesterday.  She does not take her medications at home.  Was given 2 rounds of nitro as well as aspirin with EMS without improvement.  Patient does states she also has a headache.  Feels "tingly" all over.  States she has cramps to her bilateral legs.  Drinks alcohol daily.  Last drink yesterday evening.  Pain located to center of chest.  Does not radiate into back, left arm, neck.  Has not followed with cardiology recently.  No sudden onset thunderclap headache, paresthesias, weakness, hemoptysis, domino pain, diarrhea, dysuria.  Has chronic cough which she relates to tobacco use.  No syncope.  No recent trauma, hitting head.  Denies additional aggravating or alleviating factors.  Rates her current pain a 9/10.  History obtained from patient and past medical records.  No interpreter used  HPI     Past Medical History:  Diagnosis Date  . Anemia   . CHF (congestive heart failure) (HCC)   . Common migraine with intractable migraine 01/25/2017  . GERD (gastroesophageal reflux disease)   . Hypertension   . Miscarriage    x3  . Sickle cell trait (HCC)   . Vitamin D deficiency 07/2019    Patient Active Problem List   Diagnosis Date Noted  . Acute bilateral low back pain without sciatica 01/22/2020  . Bilateral leg cramps 12/19/2018  . Vitamin D deficiency 12/19/2018  . Chest discomfort 11/14/2018  . Wrist strain, right, initial encounter 11/14/2018  . Common migraine with intractable migraine 01/25/2017  . Hypertension 12/27/2016  . Chronic  combined systolic and diastolic congestive heart failure (HCC) 11/09/2016  . Chest pain 11/08/2016  . Hypertensive urgency 11/08/2016  . Admission for sterilization 08/17/2012  . Spontaneous vaginal delivery 08/16/2012  . Consultation for sterilization 07/25/2012  . Poor fetal growth, affecting management of mother, antepartum condition or complication 07/11/2012  . Chronic hypertension complicating or reason for care during pregnancy 04/30/2012  . Supervision of high-risk pregnancy 04/30/2012  . Sickle cell trait (HCC) 04/30/2012    Past Surgical History:  Procedure Laterality Date  . BREAST SURGERY     cyst removal 1990  . DILATION AND CURETTAGE OF UTERUS     four miscarriages  . TUBAL LIGATION Bilateral 08/17/2012   Procedure: POST PARTUM TUBAL LIGATION;  Surgeon: Allie BossierMyra C Dove, MD;  Location: [facility];  Service: Gynecology;  Laterality: Bilateral;     OB History    Gravida  5   Para  2   Term  2   Preterm  0   AB  3   Living  2     SAB  3   IAB  0   Ectopic  0   Multiple  0   Live Births  2           Family History  Problem Relation Age of Onset  . Hypertension Mother   . Hypertension Maternal Grandmother   . Hypertension Maternal Grandfather   . Other Neg Hx     Social History   Tobacco Use  .  Smoking status: Current Every Day Smoker    Packs/day: 0.30    Years: 10.00    Pack years: 3.00    Types: Cigarettes  . Smokeless tobacco: Never Used  Vaping Use  . Vaping Use: Never used  Substance Use Topics  . 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 Drug use: No    Home Medications Prior to Admission medications   Medication Sig Start Date End Date Taking? Authorizing Provider  albuterol (VENTOLIN HFA) 108 (90 Base) MCG/ACT inhaler Inhale 2 puffs into the lungs every 4 (four) hours as needed for wheezing or shortness of breath (cough, shortness of breath or wheezing.). 01/19/20   Kallie Locks, FNP   budesonide-formoterol (SYMBICORT) 80-4.5 MCG/ACT inhaler Inhale 2 puffs into the lungs 2 (two) times daily. 01/19/20   Kallie Locks, FNP  buPROPion (WELLBUTRIN SR) 150 MG 12 hr tablet Take 1 tablet (150 mg total) by mouth 2 (two) times daily. 01/19/20   Kallie Locks, FNP  carvedilol (COREG) 25 MG tablet Take 1 tablet (25 mg total) by mouth 2 (two) times daily with a meal. 01/19/20 01/13/21  Kallie Locks, FNP  cyclobenzaprine (FLEXERIL) 10 MG tablet Take 1 tablet (10 mg total) by mouth 2 (two) times daily as needed for muscle spasms. 01/19/20   Kallie Locks, FNP  famotidine (PEPCID) 20 MG tablet Take 1 tablet (20 mg total) by mouth 2 (two) times daily. 07/21/19   Kallie Locks, FNP  furosemide (LASIX) 20 MG tablet Take 1 tablet (20 mg total) by mouth daily. 09/02/19 12/01/19  Rosalio Macadamia, NP  gabapentin (NEURONTIN) 300 MG capsule TAKE 1 CAPSULE BY MOUTH THREE TIMES A DAY 01/19/20   Kallie Locks, FNP  hydrALAZINE (APRESOLINE) 25 MG tablet Take 1 tablet (25 mg total) by mouth 3 (three) times daily. 01/19/20 01/13/21  Kallie Locks, FNP  ibuprofen (ADVIL) 800 MG tablet Take 1 tablet (800 mg total) by mouth every 8 (eight) hours as needed. 07/21/19   Kallie Locks, FNP  lidocaine (LIDODERM) 5 % Place 1 patch onto the skin daily. Remove & Discard patch within 12 hours or as directed by MD 01/19/20   Henderly, Britni A, PA-C  methocarbamol (ROBAXIN) 500 MG tablet Take 1 tablet (500 mg total) by mouth 2 (two) times daily. 01/19/20   Henderly, Britni A, PA-C  nitroGLYCERIN (NITROSTAT) 0.3 MG SL tablet Place 1 tablet (0.3 mg total) under the tongue every 5 (five) minutes as needed for chest pain. Patient not taking: Reported on 01/19/2020 11/14/18   Kallie Locks, FNP  RESTASIS 0.05 % ophthalmic emulsion Place 1 drop into both eyes as directed. 11/13/18   Kallie Locks, FNP  sacubitril-valsartan (ENTRESTO) 97-103 MG Take 1 tablet by mouth 2 (two) times daily. 02/03/19   Kallie Locks, FNP  Vitamin D, Ergocalciferol, (DRISDOL) 1.25 MG (50000 UNIT) CAPS capsule Take 1 capsule (50,000 Units total) by mouth every 7 (seven) days. 07/24/19   Kallie Locks, FNP    Allergies    Patient has no known allergies.  Review of Systems   Review of Systems  Constitutional: Negative.   HENT: Negative.   Respiratory: Positive for cough and shortness of breath.   Cardiovascular: Positive for chest pain. Negative for palpitations and leg swelling.  Gastrointestinal: Negative.   Genitourinary: Negative.   Musculoskeletal: Positive for myalgias.  Skin: Negative.   Neurological: Positive for headaches. Negative for  dizziness, seizures, syncope, facial asymmetry, speech difficulty, weakness, light-headedness and numbness.  All other systems reviewed and are negative.   Physical Exam Updated Vital Signs BP (!) 154/97   Pulse 65   Temp 97.9 F (36.6 C) (Oral)   Resp 12   SpO2 98%   Physical Exam Vitals and nursing note reviewed.  Constitutional:      General: She is not in acute distress.    Appearance: She is well-developed and well-nourished. She is not ill-appearing, toxic-appearing or diaphoretic.  HENT:     Head: Atraumatic.  Eyes:     Pupils: Pupils are equal, round, and reactive to light.  Cardiovascular:     Rate and Rhythm: Tachycardia present.     Pulses: Intact distal pulses.          Radial pulses are 2+ on the right side and 2+ on the left side.       Dorsalis pedis pulses are 2+ on the right side and 2+ on the left side.     Heart sounds: Normal heart sounds.  Pulmonary:     Effort: Pulmonary effort is normal. No respiratory distress.     Breath sounds: Normal breath sounds.     Comments: Speaks in full sentences without difficulty clear to auscultation bilaterally Chest:     Comments: Equal rise and fall to chest wall Abdominal:     General: Bowel sounds are normal. There is no distension.     Palpations: Abdomen is soft.     Comments: Soft,  nontender.  Normoactive bowel sounds no midline pulsatile abdominal mass  Musculoskeletal:        General: Normal range of motion.     Cervical back: Normal range of motion.     Right lower leg: Tenderness present. No edema.     Left lower leg: Tenderness present. No edema.     Comments: Generalized tenderness to bilateral lower extremities.  No edema, erythema or warmth.  No bony tenderness.  Denna Haggard' sign negative  Skin:    General: Skin is warm and dry.     Capillary Refill: Capillary refill takes less than 2 seconds.     Comments: No rashes or lesions.  Neurological:     General: No focal deficit present.     Mental Status: She is alert and oriented to person, place, and time.     Cranial Nerves: Cranial nerves are intact.     Sensory: Sensation is intact.     Motor: Motor function is intact.     Coordination: Coordination is intact.     Gait: Gait is intact.     Deep Tendon Reflexes: Reflexes are normal and symmetric.     Comments: Cranial nerves II to grossly intact Intact sensation bilateral Equal strength bilaterally  Psychiatric:        Mood and Affect: Mood and affect normal.     Comments: Appears anxious     ED Results / Procedures / Treatments   Labs (all labs ordered are listed, but only abnormal results are displayed) Labs Reviewed  COMPREHENSIVE METABOLIC PANEL - Abnormal; Notable for the following components:      Result Value   Potassium 3.4 (*)    CO2 19 (*)    All other components within normal limits  D-DIMER, QUANTITATIVE - Abnormal; Notable for the following components:   D-Dimer, Quant 0.51 (*)    All other components within normal limits  CBC WITH DIFFERENTIAL/PLATELET  BRAIN NATRIURETIC PEPTIDE  I-STAT BETA HCG  BLOOD, ED ([facility], [facility], [facility] ONLY)  TROPONIN I (HIGH SENSITIVITY)  TROPONIN I (HIGH SENSITIVITY)    EKG None  Radiology DG Chest 2 View  Result Date: 09/05/2020 CLINICAL DATA:  Acute chest pain and shortness of breath. EXAM: CHEST - 2 VIEW  COMPARISON:  04/12/2020 FINDINGS: The cardiomediastinal silhouette is unremarkable. There is no evidence of focal airspace disease, pulmonary edema, suspicious pulmonary nodule/mass, pleural effusion, or pneumothorax. No acute bony abnormalities are identified. IMPRESSION: No active cardiopulmonary disease. Electronically Signed   By: Harmon Pier M.D.   On: 09/05/2020 11:20   CT Head Wo Contrast  Result Date: 09/05/2020 CLINICAL DATA:  Headache. EXAM: CT HEAD WITHOUT CONTRAST TECHNIQUE: Contiguous axial images were obtained from the base of the skull through the vertex without intravenous contrast. COMPARISON:  Nov 09, 2008 FINDINGS: Brain: No evidence of acute infarction, hemorrhage, hydrocephalus, extra-axial collection or mass lesion/mass effect. Vascular: No hyperdense vessel or unexpected calcification. Skull: Normal. Negative for fracture or focal lesion. Sinuses/Orbits: No acute finding. Other: None. IMPRESSION: No acute intracranial abnormalities. Electronically Signed   By: Gerome Sam III M.D   On: 09/05/2020 12:24   CT Angio Chest PE W/Cm &/Or Wo Cm  Result Date: 09/05/2020 CLINICAL DATA:  Central/left-sided chest pain and shortness of breath. EXAM: CT ANGIOGRAPHY CHEST WITH CONTRAST TECHNIQUE: Multidetector CT imaging of the chest was performed using the standard protocol during bolus administration of intravenous contrast. Multiplanar CT image reconstructions and MIPs were obtained to evaluate the vascular anatomy. CONTRAST:  42mL OMNIPAQUE IOHEXOL 350 MG/ML SOLN COMPARISON:  Chest radiograph of earlier today.  CT chest 11/09/2018 FINDINGS: Cardiovascular: The quality of this exam for evaluation of pulmonary embolism is good. No evidence of pulmonary embolism. Normal aortic caliber. Aorta not opacified to evaluate for dissection. Mild cardiomegaly, without pericardial effusion. Mediastinum/Nodes: No mediastinal or definite hilar adenopathy. Lungs/Pleura: No pleural fluid.  Clear lungs. Upper  Abdomen: Normal imaged portions of the liver, spleen, stomach, pancreas, gallbladder, adrenal glands, kidneys. Musculoskeletal: No acute osseous abnormality. Review of the MIP images confirms the above findings. IMPRESSION: 1.  No evidence of pulmonary embolism. 2. No explanation for patient's symptoms. Electronically Signed   By: Jeronimo Greaves M.D.   On: 09/05/2020 12:32    Procedures Procedures   Medications Ordered in ED Medications  morphine 4 MG/ML injection 4 mg (4 mg Intravenous Given 09/05/20 1034)  sodium chloride 0.9 % bolus 500 mL (500 mLs Intravenous New Bag/Given 09/05/20 1035)  ondansetron (ZOFRAN) injection 4 mg (4 mg Intravenous Given 09/05/20 1033)  iohexol (OMNIPAQUE) 350 MG/ML injection 75 mL (75 mLs Intravenous Contrast Given 09/05/20 1150)  prochlorperazine (COMPAZINE) injection 10 mg (10 mg Intravenous Given 09/05/20 1241)  diphenhydrAMINE (BENADRYL) injection 12.5 mg (12.5 mg Intravenous Given 09/05/20 1241)  morphine 4 MG/ML injection 4 mg (4 mg Intravenous Given 09/05/20 1242)  hydrALAZINE (APRESOLINE) tablet 50 mg (50 mg Oral Given 09/05/20 1319)    ED Course  I have reviewed the triage vital signs and the nursing notes.  Pertinent labs & imaging results that were available during my care of the patient were reviewed by me and considered in my medical decision making (see chart for details).  DEXA scan in 2019 did not show any evidence of ischemia. ECHO 2018 EF 35-40%, did not show in 2021 for repeat  Patient here for evaluation of chest pain and shortness of breath which began yesterday.  On arrival she is afebrile, nonseptic, non-ill-appearing.  Does have history significant for uncontrolled hypertension,  dilated cardiomyopathy related to EtOH use, medical noncompliance.  Admits to headache as well however is a nonfocal neuro exam without deficits.  No sudden onset thunderclap headache.  Does admit to upper and lower extremity tingling however has intact sensation on my exam.  No  weakness.  Her heart and lungs are clear.  Abdomen is soft, nontender.  No clinical evidence of DVT on exam however she is tachycardic and hypertensive.  No pain radiating to her back to suggest dissection, no midline pulsatile abdominal mass.  Does not appear grossly fluid overloaded.  Did drink alcohol yesterday.  No history of DTs or withdrawal seizures.  We will plan on labs, imaging and reassess.  Of note received nitro x2 as well as aspirin in route without relief of pain.  Labs and imaging personally reviewed and interpreted: Preg negative CBC without significant abnormality Trop 3>>>5 BNP 17.9 CMP potassium 3.4, CO2 19 no additional abnormality Ddimer 0.51 CTA chest without acute abnormality Ct head Wo acute abnormality Dg chest without acute abnormality EKG not pulling in to Epic however without ischemic changes  Patient reassessed.  Chest pain, shortness of breath has resolved.  Does still have headache.  He is still hypertensive.  Will give 1 dose of hydralazine (home med) and reassess.  Also give migraine cocktail.  Patient symptoms improved.  Blood pressure has come down some.  She is ambulatory that difficulty.  No chest pain, shortness of breath.  Continues have a nonfocal neuro exam without deficits.  Urged close follow-up with PCP and compliance with her medications.  Low suspicion for hypertensive emergency at this time.  Chest pain is not likely of cardiac or pulmonary etiology d/t presentation, PERC negative, VSS, no tracheal deviation, no JVD or new murmur, RRR, breath sounds equal bilaterally, EKG without acute abnormalities, negative troponin, and negative CXR. Pt has been advised to return to the ED if CP becomes exertional, associated with diaphoresis or nausea, radiates to left jaw/arm, worsens or becomes concerning in any way. Pt appears reliable for follow up and is agreeable to discharge.   Pt HA treated and improved while in ED.  Presentation non concerning for [facility],  ICH, Meningitis, dissection or temporal arteritis. Pt is afebrile with no focal neuro deficits, nuchal rigidity, or change in vision.   The patient has been appropriately medically screened and/or stabilized in the ED. I have low suspicion for any other emergent medical condition which would require further screening, evaluation or treatment in the ED or require inpatient management.  Patient is hemodynamically stable and in no acute distress.  Patient able to ambulate in department prior to ED.  Evaluation does not show acute pathology that would require ongoing or additional emergent interventions while in the emergency department or further inpatient treatment.  I have discussed the diagnosis with the patient and answered all questions.  Pain is been managed while in the emergency department and patient has no further complaints prior to discharge.  Patient is comfortable with plan discussed in room and is stable for discharge at this time.  I have discussed strict return precautions for returning to the emergency department.  Patient was encouraged to follow-up with PCP/specialist refer to at discharge.      MDM Rules/Calculators/A&P                          [name] was evaluated in Emergency Department on 09/05/2020 for the symptoms described in the history of present  illness. She was evaluated in the context of the global COVID-19 pandemic, which necessitated consideration that the patient might be at risk for infection with the SARS-CoV-2 virus that causes COVID-19. Institutional protocols and algorithms that pertain to the evaluation of patients at risk for COVID-19 are in a state of rapid change based on information released by regulatory bodies including the CDC and federal and state organizations. These policies and algorithms were followed during the patient's care in the ED. Final Clinical Impression(s) / ED Diagnoses Final diagnoses:  Precordial pain  Acute nonintractable  headache, unspecified headache type  Hypertension, unspecified type    Rx / DC Orders ED Discharge Orders    None       Henderly, Britni A, PA-C 09/05/20 1408    Tegeler, Canary Brim, MD 09/05/20 [phone]

## 2020-09-05 NOTE — ED Triage Notes (Signed)
Pt bib ems from home with reports of central/left sided chest pain with shob onset yesterday. Pt reports she has been out of her heart failure medications for the last few weeks. Pt given 324mg  asa and 2 sl nitro en route with minimal improvement. Initial BP 180/100, HR 120, RR 22, 95% RA.

## 2020-09-05 NOTE — ED Notes (Signed)
Reviewed discharge instructions with patient. Follow-up care reviewed. Patient verbalized understanding. Patient A&Ox4, VSS, and ambulatory with steady gait upon discharge.  

## 2020-09-05 NOTE — Discharge Instructions (Signed)
Follow-up with your primary care provider in 2 days.  Start taking your home medications for blood pressure and your heart failure  Return for new or worsening symptoms.

## 2020-09-06 ENCOUNTER — Telehealth: Payer: Self-pay | Admitting: *Deleted

## 2020-09-06 NOTE — Telephone Encounter (Signed)
Transition Care Management Unsuccessful Follow-up Telephone Call  Date of discharge and from where:  09/05/[name]  Attempts:  1st Attempt  Reason for unsuccessful TCM follow-up call:  No answer/busy

## 2020-09-07 NOTE — Telephone Encounter (Signed)
Transition Care Management Follow-up Telephone Call  Date of discharge and from where: 09/05/2020 - [facility]  How have you been since you were released from the hospital? "About the same"  Any questions or concerns? No  Items Reviewed:  Did the pt receive and understand the discharge instructions provided? Yes   Medications obtained and verified? Yes   Other? No   Any new allergies since your discharge? No   Dietary orders reviewed? Yes  Do you have support at home? Yes   Home Care and Equipment/Supplies: Were home health services ordered? not applicable If so, what is the name of the agency? N/A  Has the agency set up a time to come to the patient's home? not applicable Were any new equipment or medical supplies ordered?  No What is the name of the medical supply agency? N/A Were you able to get the supplies/equipment? not applicable Do you have any questions related to the use of the equipment or supplies? No  Functional Questionnaire: (I = Independent and D = Dependent) ADLs: I  Bathing/Dressing- I  Meal Prep- I  Eating- I  Maintaining continence- I  Transferring/Ambulation- I  Managing Meds- I  Follow up appointments reviewed:   PCP Hospital f/u appt confirmed? No    Specialist Hospital f/u appt confirmed? No    Are transportation arrangements needed? No   If their condition worsens, is the pt aware to call PCP or go to the Emergency Dept.? Yes  Was the patient provided with contact information for the PCP's office or ED? Yes  Was to pt encouraged to call back with questions or concerns? Yes

## 2020-09-17 ENCOUNTER — Telehealth: Payer: Self-pay

## 2020-09-17 NOTE — Telephone Encounter (Signed)
ALL med's to be filled.

## 2020-09-24 ENCOUNTER — Telehealth: Payer: Self-pay

## 2020-09-24 NOTE — Telephone Encounter (Signed)
Call pt to schedule appt with Shary Key. For hosp f/u

## 2021-02-04 ENCOUNTER — Emergency Department (HOSPITAL_COMMUNITY): Payer: Medicaid Other

## 2021-02-04 ENCOUNTER — Emergency Department (HOSPITAL_COMMUNITY)
Admission: EM | Admit: 2021-02-04 | Discharge: 2021-02-04 | Disposition: A | Discharge status: Home / Self Care | Payer: Medicaid Other | Attending: Emergency Medicine | Admitting: Emergency Medicine

## 2021-02-04 DIAGNOSIS — R202 Paresthesia of skin: Secondary | ICD-10-CM | POA: Insufficient documentation

## 2021-02-04 DIAGNOSIS — R42 Dizziness and giddiness: Secondary | ICD-10-CM | POA: Insufficient documentation

## 2021-02-04 DIAGNOSIS — F17210 Nicotine dependence, cigarettes, uncomplicated: Secondary | ICD-10-CM | POA: Diagnosis not present

## 2021-02-04 DIAGNOSIS — I110 Hypertensive heart disease with heart failure: Secondary | ICD-10-CM | POA: Diagnosis not present

## 2021-02-04 DIAGNOSIS — R0600 Dyspnea, unspecified: Secondary | ICD-10-CM | POA: Insufficient documentation

## 2021-02-04 DIAGNOSIS — R519 Headache, unspecified: Secondary | ICD-10-CM | POA: Insufficient documentation

## 2021-02-04 DIAGNOSIS — R079 Chest pain, unspecified: Secondary | ICD-10-CM | POA: Diagnosis present

## 2021-02-04 DIAGNOSIS — I5042 Chronic combined systolic (congestive) and diastolic (congestive) heart failure: Secondary | ICD-10-CM | POA: Diagnosis not present

## 2021-02-04 DIAGNOSIS — Z79899 Other long term (current) drug therapy: Secondary | ICD-10-CM | POA: Insufficient documentation

## 2021-02-04 LAB — COMPREHENSIVE METABOLIC PANEL
ALT: 18 U/L (ref 0–44)
AST: 22 U/L (ref 15–41)
Albumin: 3.6 g/dL (ref 3.5–5.0)
Alkaline Phosphatase: 67 U/L (ref 38–126)
Anion gap: 11 (ref 5–15)
BUN: 15 mg/dL (ref 6–20)
CO2: 20 mmol/L — ABNORMAL LOW (ref 22–32)
Calcium: 9.1 mg/dL (ref 8.9–10.3)
Chloride: 106 mmol/L (ref 98–111)
Creatinine, Ser: 0.7 mg/dL (ref 0.44–1.00)
GFR, Estimated: >60 mL/min (ref >60)
Glucose, Bld: 97 mg/dL (ref 70–99)
Potassium: 4.3 mmol/L (ref 3.5–5.1)
Sodium: 137 mmol/L (ref 135–145)
Total Bilirubin: 0.5 mg/dL (ref 0.3–1.2)
Total Protein: 6.6 g/dL (ref 6.5–8.1)

## 2021-02-04 LAB — CBC WITH DIFFERENTIAL/PLATELET
Abs Immature Granulocytes: 0.03 10*3/uL (ref 0.00–0.07)
Basophils Absolute: 0.1 10*3/uL (ref 0.0–0.1)
Basophils Relative: 1 %
Eosinophils Absolute: 0.3 10*3/uL (ref 0.0–0.5)
Eosinophils Relative: 5 %
HCT: 37.2 % (ref 36.0–46.0)
Hemoglobin: 12.3 g/dL (ref 12.0–15.0)
Immature Granulocytes: 1 %
Lymphocytes Relative: 33 %
Lymphs Abs: 1.9 10*3/uL (ref 0.7–4.0)
MCH: 29.6 pg (ref 26.0–34.0)
MCHC: 33.1 g/dL (ref 30.0–36.0)
MCV: 89.6 fL (ref 80.0–100.0)
Monocytes Absolute: 0.4 10*3/uL (ref 0.1–1.0)
Monocytes Relative: 7 %
Neutro Abs: 3.1 10*3/uL (ref 1.7–7.7)
Neutrophils Relative %: 53 %
Platelets: 268 10*3/uL (ref 150–400)
RBC: 4.15 MIL/uL (ref 3.87–5.11)
RDW: 14.2 % (ref 11.5–15.5)
WBC: 5.7 10*3/uL (ref 4.0–10.5)
nRBC: 0 % (ref 0.0–0.2)

## 2021-02-04 LAB — BRAIN NATRIURETIC PEPTIDE: B Natriuretic Peptide: 50.4 pg/mL (ref 0.0–100.0)

## 2021-02-04 LAB — TROPONIN I (HIGH SENSITIVITY)
Troponin I (High Sensitivity): 4 ng/L (ref <18)
Troponin I (High Sensitivity): 3 ng/L (ref <18)
Troponin I (High Sensitivity): 3 ng/L (ref <18)

## 2021-02-04 LAB — LIPASE, BLOOD: Lipase: 39 U/L (ref 11–51)

## 2021-02-04 MED ORDER — ALUM & MAG HYDROXIDE-SIMETH 200-200-20 MG/5ML PO SUSP
30.0000 mL | Freq: Once | ORAL | Status: AC
  Administered 2021-02-04: 30 mL via ORAL
  Filled 2021-02-04: qty 30

## 2021-02-04 MED ORDER — PROCHLORPERAZINE EDISYLATE 10 MG/2ML IJ SOLN
10.0000 mg | Freq: Once | INTRAMUSCULAR | Status: AC
  Administered 2021-02-04: 10 mg via INTRAVENOUS
  Filled 2021-02-04: qty 2

## 2021-02-04 MED ORDER — LIDOCAINE VISCOUS HCL 2 % MT SOLN
15.0000 mL | Freq: Once | OROMUCOSAL | Status: AC
  Administered 2021-02-04: 15 mL via ORAL
  Filled 2021-02-04: qty 15

## 2021-02-04 MED ORDER — DIPHENHYDRAMINE HCL 50 MG/ML IJ SOLN
12.5000 mg | Freq: Once | INTRAMUSCULAR | Status: AC
  Administered 2021-02-04: 12.5 mg via INTRAVENOUS
  Filled 2021-02-04: qty 1

## 2021-02-04 MED ORDER — GABAPENTIN 300 MG PO CAPS: 300.0000 mg | ORAL_CAPSULE | Freq: Three times a day (TID) | ORAL | 0 refills | Status: DC

## 2021-02-04 MED ORDER — ENTRESTO 97-103 MG PO TABS: 1.0000 | ORAL_TABLET | Freq: Two times a day (BID) | ORAL | 0 refills | Status: AC

## 2021-02-04 MED ORDER — FAMOTIDINE 20 MG PO TABS: 20.0000 mg | ORAL_TABLET | Freq: Two times a day (BID) | ORAL | 0 refills | Status: DC

## 2021-02-04 MED ORDER — HYDRALAZINE HCL 25 MG PO TABS: 25.0000 mg | ORAL_TABLET | Freq: Three times a day (TID) | ORAL | 0 refills | Status: DC

## 2021-02-04 MED ORDER — FUROSEMIDE 20 MG PO TABS: 20.0000 mg | ORAL_TABLET | Freq: Every day | ORAL | 0 refills | Status: DC

## 2021-02-04 MED ORDER — ASPIRIN 81 MG PO CHEW
324.0000 mg | CHEWABLE_TABLET | Freq: Once | ORAL | Status: AC
  Administered 2021-02-04: 324 mg via ORAL
  Filled 2021-02-04: qty 4

## 2021-02-04 MED ORDER — LORAZEPAM 2 MG/ML IJ SOLN
0.5000 mg | Freq: Once | INTRAMUSCULAR | Status: AC
  Administered 2021-02-04: 0.5 mg via INTRAVENOUS
  Filled 2021-02-04: qty 1

## 2021-02-04 MED ORDER — CARVEDILOL 25 MG PO TABS: 25.0000 mg | ORAL_TABLET | Freq: Two times a day (BID) | ORAL | 1 refills | Status: DC

## 2021-02-04 NOTE — Discharge Instructions (Addendum)
You were seen in the emergency department today for chest pain. Your work-up in the emergency department has been overall reassuring. Your labs have been fairly normal and or similar to previous blood work you have had done. Your EKG and the enzyme we use to check your heart did not show an acute heart attack at this time. Your chest x-ray was normal.   We have sent in 30-day refills of your medicines  We would like you to follow up closely with your primary care provider and/or the cardiologist provided in your discharge instructions within 1-3 days. Return to the ER immediately should you experience any new or worsening symptoms including but not limited to return of pain, worsened pain, vomiting, shortness of breath, dizziness, lightheadedness, passing out, or any other concerns that you may have.

## 2021-02-04 NOTE — ED Notes (Signed)
[name] [phone] would like an update and to talk to his mom asap

## 2021-02-04 NOTE — ED Provider Notes (Addendum)
Emergency Medicine Provider Triage Evaluation Note  [name] , a 45 y.o. female  was evaluated in triage.  Pt complains of chest pain and headache.  Symptoms started 30 minutes prior while she was at work.  Patient reports pain is located to epigastric area does not radiate.  Pain described as sharp.  Associated diaphoresis and shortness of breath.  Headache onset was gradual and pain is gotten progressively worse over time.  Pain located across frontal temporal area.  No associated visual disturbance, facial asymmetry, slurred speech, weakness.  Patient endorses tingling sensation to bilateral hands and feet.  Patient has not been on her medication for the last month  Review of Systems  Positive: Chest pain, headache, tingling sensation to bilateral hands and feet, shortness of breath, Negative: Nausea, vomiting, visual disturbance, weakness, syncope, palpitations, leg swelling  Physical Exam  BP (!) 150/94   Pulse 73   Temp 98.7 F (37.1 C) (Oral)   Resp 16   SpO2 100%  Gen:   Awake, no distress   Resp:  Normal effort, lungs clear to auscultation bilaterally MSK:   Moves extremities without difficulty, no swelling to bilateral lower legs Other:  Abdomen soft, nondistended, nontender.  Medical Decision Making  Medically screening exam initiated at 12:46 PM.  Appropriate orders placed.  [name] was informed that the remainder of the evaluation will be completed by another provider, this initial triage assessment does not replace that evaluation, and the importance of remaining in the ED until their evaluation is complete.  The patient appears stable so that the remainder of the work up may be completed by another provider.      Haskel Schroeder, PA-C 02/04/21 1253    Haskel Schroeder, PA-C 02/04/21 1351    Rolan Bucco, MD 02/05/21 [phone]

## 2021-02-04 NOTE — ED Notes (Addendum)
Arrives to [facility] stretcher by w/c. Alert, NAD, calm, interactive, resps e/u, speaking in clear complete sentences. Initial labs and imaging reviewed. C/o epigastric pain and HA. States feels like my acid reflux.

## 2021-02-04 NOTE — ED Triage Notes (Signed)
Pt bib ems with reports of anxiety. Hx heart failure. Off of medications for approx 1 month. Pt used her inhaler X3 with no improvement in her symptoms.  126/80 HR 100 RR 20 98% RA

## 2021-02-04 NOTE — ED Notes (Signed)
Resting/ sleeping, lying on R side, NAD, calm.

## 2021-02-04 NOTE — ED Notes (Signed)
ED PA at [facility] 

## 2021-02-04 NOTE — ED Provider Notes (Signed)
[facility] Provider Note   CSN: [id number] Arrival date & time: 02/04/21  1234     History Chief Complaint  Patient presents with   Chest Pain   Headache    [name] is a 45 y.o. female with a hx of CHF, dilated cardiomyopathy, EtOH abuse, migraines, hypertension, GERD, & sickle cell trait who presents to the ED with complaints of chest pain that began @ 12PM today. Patient states that she was at work when she developed sharp lower chest pain that has been constant since onset with associated dyspnea, lightheadedness, headache, and tingling all over. No alleviating/aggravating factors to her sxs. She states she has had similar headache & chest pain in the past, but that this seems worse. Denies nausea, vomiting, syncope, unilateral numbness/weakness, syncope, vertigo, leg pain/swelling, hemoptysis, recent surgery/trauma, recent long travel, hormone use, personal hx of cancer, or hx of DVT/PE. She is currently out of multiple of her medicines.    HPI     Past Medical History:  Diagnosis Date   Anemia    CHF (congestive heart failure) (HCC)    Common migraine with intractable migraine 01/25/2017   GERD (gastroesophageal reflux disease)    Hypertension    Miscarriage    x3   Sickle cell trait (HCC)    Vitamin D deficiency 07/2019    Patient Active Problem List   Diagnosis Date Noted   Acute bilateral low back pain without sciatica 01/22/2020   Bilateral leg cramps 12/19/2018   Vitamin D deficiency 12/19/2018   Chest discomfort 11/14/2018   Wrist strain, right, initial encounter 11/14/2018   Common migraine with intractable migraine 01/25/2017   Hypertension 12/27/2016   Chronic combined systolic and diastolic congestive heart failure (HCC) 11/09/2016   Chest pain 11/08/2016   Hypertensive urgency 11/08/2016   Admission for sterilization 08/17/2012   Spontaneous vaginal delivery 08/16/2012   Consultation for sterilization  07/25/2012   Poor fetal growth, affecting management of mother, antepartum condition or complication 07/11/2012   Chronic hypertension complicating or reason for care during pregnancy 04/30/2012   Supervision of high-risk pregnancy 04/30/2012   Sickle cell trait (HCC) 04/30/2012    Past Surgical History:  Procedure Laterality Date   BREAST SURGERY     cyst removal 1990   DILATION AND CURETTAGE OF UTERUS     four miscarriages   TUBAL LIGATION Bilateral 08/17/2012   Procedure: POST PARTUM TUBAL LIGATION;  Surgeon: Allie BossierMyra C Dove, MD;  Location: [facility];  Service: Gynecology;  Laterality: Bilateral;     OB History     Gravida  5   Para  2   Term  2   Preterm  0   AB  3   Living  2      SAB  3   IAB  0   Ectopic  0   Multiple  0   Live Births  2           Family History  Problem Relation Age of Onset   Hypertension Mother    Hypertension Maternal Grandmother    Hypertension Maternal Grandfather    Other Neg Hx     Social History   Tobacco Use   Smoking status: Every Day    Packs/day: 0.30    Years: 10.00    Pack years: 3.00    Types: Cigarettes   Smokeless tobacco: Never  Vaping Use   Vaping Use: Never used  Substance Use Topics   Alcohol  use: Yes    Comment: History of heavy alcohol use until 5/18   Drug use: No    Home Medications Prior to Admission medications   Medication Sig Start Date End Date Taking? Authorizing Provider  albuterol (VENTOLIN HFA) 108 (90 Base) MCG/ACT inhaler Inhale 2 puffs into the lungs every 4 (four) hours as needed for wheezing or shortness of breath (cough, shortness of breath or wheezing.). 01/19/20   Kallie Locks, FNP  budesonide-formoterol (SYMBICORT) 80-4.5 MCG/ACT inhaler Inhale 2 puffs into the lungs 2 (two) times daily. 01/19/20   Kallie Locks, FNP  buPROPion (WELLBUTRIN SR) 150 MG 12 hr tablet Take 1 tablet (150 mg total) by mouth 2 (two) times daily. 01/19/20   Kallie Locks, FNP  carvedilol (COREG)  25 MG tablet Take 1 tablet (25 mg total) by mouth 2 (two) times daily with a meal. 01/19/20 01/13/21  Kallie Locks, FNP  cyclobenzaprine (FLEXERIL) 10 MG tablet Take 1 tablet (10 mg total) by mouth 2 (two) times daily as needed for muscle spasms. 01/19/20   Kallie Locks, FNP  famotidine (PEPCID) 20 MG tablet Take 1 tablet (20 mg total) by mouth 2 (two) times daily. 07/21/19   Kallie Locks, FNP  furosemide (LASIX) 20 MG tablet Take 1 tablet (20 mg total) by mouth daily. 09/02/19 12/01/19  Rosalio Macadamia, NP  gabapentin (NEURONTIN) 300 MG capsule TAKE 1 CAPSULE BY MOUTH THREE TIMES A DAY 01/19/20   Kallie Locks, FNP  hydrALAZINE (APRESOLINE) 25 MG tablet Take 1 tablet (25 mg total) by mouth 3 (three) times daily. 01/19/20 01/13/21  Kallie Locks, FNP  ibuprofen (ADVIL) 800 MG tablet Take 1 tablet (800 mg total) by mouth every 8 (eight) hours as needed. 07/21/19   Kallie Locks, FNP  lidocaine (LIDODERM) 5 % Place 1 patch onto the skin daily. Remove & Discard patch within 12 hours or as directed by MD 01/19/20   Henderly, Britni A, PA-C  methocarbamol (ROBAXIN) 500 MG tablet Take 1 tablet (500 mg total) by mouth 2 (two) times daily. 01/19/20   Henderly, Britni A, PA-C  nitroGLYCERIN (NITROSTAT) 0.3 MG SL tablet Place 1 tablet (0.3 mg total) under the tongue every 5 (five) minutes as needed for chest pain. Patient not taking: Reported on 01/19/2020 11/14/18   Kallie Locks, FNP  RESTASIS 0.05 % ophthalmic emulsion Place 1 drop into both eyes as directed. 11/13/18   Kallie Locks, FNP  sacubitril-valsartan (ENTRESTO) 97-103 MG Take 1 tablet by mouth 2 (two) times daily. 02/03/19   Kallie Locks, FNP  Vitamin D, Ergocalciferol, (DRISDOL) 1.25 MG (50000 UNIT) CAPS capsule Take 1 capsule (50,000 Units total) by mouth every 7 (seven) days. 07/24/19   Kallie Locks, FNP    Allergies    Patient has no known allergies.  Review of Systems   Review of Systems  Constitutional:   Negative for chills and fever.  Respiratory:  Positive for shortness of breath.   Cardiovascular:  Positive for chest pain. Negative for leg swelling.  Gastrointestinal:  Negative for constipation, diarrhea, nausea and vomiting.  Genitourinary:  Negative for dysuria.  Neurological:  Positive for headaches. Negative for dizziness, syncope, weakness and numbness.       Positive for tingling all over.   All other systems reviewed and are negative.  Physical Exam Updated Vital Signs BP (!) 150/94   Pulse 73   Temp 98.7 F (37.1 C) (Oral)   Resp 16  SpO2 100%   Physical Exam Vitals and nursing note reviewed.  Constitutional:      General: She is not in acute distress.    Appearance: She is well-developed. She is not toxic-appearing.  HENT:     Head: Normocephalic and atraumatic.  Eyes:     General: Vision grossly intact. Gaze aligned appropriately.        Right eye: No discharge.        Left eye: No discharge.     Extraocular Movements: Extraocular movements intact.     Conjunctiva/sclera: Conjunctivae normal.     Comments: PERRL. No proptosis.   Cardiovascular:     Rate and Rhythm: Normal rate and regular rhythm.     Pulses:          Radial pulses are 2+ on the right side and 2+ on the left side.  Pulmonary:     Effort: Pulmonary effort is normal. No respiratory distress.     Breath sounds: Normal breath sounds. No wheezing, rhonchi or rales.  Chest:     Chest wall: Tenderness (lower chest wall) present.  Abdominal:     General: There is no distension.     Palpations: Abdomen is soft.     Tenderness: There is abdominal tenderness (mild epigastric). There is no guarding or rebound.     Comments: Negative murphys  Musculoskeletal:        General: No tenderness.     Cervical back: Normal range of motion and neck supple. No rigidity.     Right lower leg: No edema.     Left lower leg: No edema.  Skin:    General: Skin is warm and dry.     Findings: No rash.   Neurological:     Comments: Alert. Clear speech. No facial droop. CNIII-XII grossly intact. Bilateral upper and lower extremities' sensation grossly intact. 5/5 symmetric strength with grip strength and with plantar and dorsi flexion bilaterally . Normal finger to nose bilaterally. Negative pronator drift. Negative Romberg sign. Gait is steady and intact.   Psychiatric:        Mood and Affect: Mood is anxious.     Comments: Tearful.     ED Results / Procedures / Treatments   Labs (all labs ordered are listed, but only abnormal results are displayed) Labs Reviewed  COMPREHENSIVE METABOLIC PANEL - Abnormal; Notable for the following components:      Result Value   CO2 20 (*)    All other components within normal limits  CBC WITH DIFFERENTIAL/PLATELET  LIPASE, BLOOD  BRAIN NATRIURETIC PEPTIDE  TROPONIN I (HIGH SENSITIVITY)  TROPONIN I (HIGH SENSITIVITY)  TROPONIN I (HIGH SENSITIVITY)    EKG None  Radiology DG Chest 2 View  Result Date: 02/04/2021 CLINICAL DATA:  Chest pain and rib pain EXAM: CHEST - 2 VIEW COMPARISON:  09/05/2020 FINDINGS: The heart size and mediastinal contours are within normal limits. Both lungs are clear. The visualized skeletal structures are unremarkable. IMPRESSION: No active cardiopulmonary disease. Electronically Signed   By: Signa Kell M.D.   On: 02/04/2021 13:34    Procedures Procedures   Medications Ordered in ED Medications  aspirin chewable tablet 324 mg (324 mg Oral Given 02/04/21 1703)  alum & mag hydroxide-simeth (MAALOX/MYLANTA) 200-200-20 MG/5ML suspension 30 mL (30 mLs Oral Given 02/04/21 1733)    And  lidocaine (XYLOCAINE) 2 % viscous mouth solution 15 mL (15 mLs Oral Given 02/04/21 1734)  prochlorperazine (COMPAZINE) injection 10 mg (10 mg Intravenous Given 02/04/21 1735)  diphenhydrAMINE (BENADRYL) injection 12.5 mg (12.5 mg Intravenous Given 02/04/21 1734)  LORazepam (ATIVAN) injection 0.5 mg (0.5 mg Intravenous Given 02/04/21 1734)    ED  Course  I have reviewed the triage vital signs and the nursing notes.  Pertinent labs & imaging results that were available during my care of the patient were reviewed by me and considered in my medical decision making (see chart for details).    MDM Rules/Calculators/A&P                           Patient presents to the ED with complaints of chest pain, dyspnea, headache, & tingling all over.   Additional history obtained:  Additional history obtained from chart review & nursing note review.   Seen in the ED 09/05/20 for very similar complaints- had a d-dimer that was marginally elevated and underwent CT head & CTA of the chest- unremarkable. With similar presentation do not feel patient needs repeat imaging at this time.   Lab Tests:  I reviewed and interpreted labs, which included:  CBC/CMP/lipase: Unremarkable.  Troponins: Flat x 3 BNP: WNL  Imaging Studies ordered:  CXR ordered in triage, I independently reviewed, formal radiology impression shows:  No active cardiopulmonary disease  ED Course:  In terms of patient's headache-history of similar, gradual onset, steady progression, afebrile, no nuchal rigidity, no focal neurologic deficits, I overall have a low suspicion for acute CVA, acute SAH, meningitis, temporal arteritis, acute dural venous sinus thrombosis, or acute angle-closure glaucoma -recent head CT which was reassuring, given migraine cocktail with improvement.  In terms of patient's chest pain-troponins are flat x3, EKG without acute STEMI, feel that ACS is less likely.  Low risk Mcclish, PERC negative, recent CT angio for similar presentation was negative for PE, patient is not hypoxic or tachycardic at this time.  No widened mediastinum on chest x-ray, symmetric pulses, doubt dissection.  Mild lower chest wall and epigastric tenderness to palpation, no abdominal peritoneal signs on exam, do not suspect acute surgical abdominal process.  She was given a GI cocktail,  feels improved.  Recommend PCP/cardiology follow-up.  Given patient is out of her medications will provide 30-day refills with cardiology and primary care follow-up pending. I discussed results, treatment plan, need for follow-up, and return precautions with the patient. Provided opportunity for questions, patient confirmed understanding and is in agreement with plan.    Portions of this note were generated with Scientist, clinical (histocompatibility and immunogenetics). Dictation errors may occur despite best attempts at proofreading.  Final Clinical Impression(s) / ED Diagnoses Final diagnoses:  Chest pain, unspecified type  Acute nonintractable headache, unspecified headache type    Rx / DC Orders ED Discharge Orders          Ordered    carvedilol (COREG) 25 MG tablet  2 times daily with meals        02/04/21 2019    famotidine (PEPCID) 20 MG tablet  2 times daily        02/04/21 2019    furosemide (LASIX) 20 MG tablet  Daily        02/04/21 2019    sacubitril-valsartan (ENTRESTO) 97-103 MG  2 times daily        02/04/21 2019    gabapentin (NEURONTIN) 300 MG capsule  3 times daily        02/04/21 2019    hydrALAZINE (APRESOLINE) 25 MG tablet  3 times daily  02/04/21 2019             Desman Polak, [place] R, PA-C 02/04/21 2025    Vanetta Mulders, MD 02/11/21 1546

## 2021-02-21 ENCOUNTER — Encounter: Payer: Self-pay | Admitting: Nurse Practitioner

## 2021-04-11 ENCOUNTER — Encounter: Payer: Self-pay | Admitting: Nurse Practitioner

## 2021-04-11 ENCOUNTER — Other Ambulatory Visit: Payer: Self-pay

## 2021-04-11 ENCOUNTER — Ambulatory Visit (INDEPENDENT_AMBULATORY_CARE_PROVIDER_SITE_OTHER): Payer: Medicaid Other | Admitting: Nurse Practitioner

## 2021-04-11 VITALS — BP 168/101 | HR 92 | Temp 97.7°F | Ht 64.0 in | Wt 111.0 lb

## 2021-04-11 DIAGNOSIS — F4321 Adjustment disorder with depressed mood: Secondary | ICD-10-CM

## 2021-04-11 DIAGNOSIS — F3289 Other specified depressive episodes: Secondary | ICD-10-CM

## 2021-04-11 DIAGNOSIS — I160 Hypertensive urgency: Secondary | ICD-10-CM

## 2021-04-11 DIAGNOSIS — R0789 Other chest pain: Secondary | ICD-10-CM

## 2021-04-11 DIAGNOSIS — Z0000 Encounter for general adult medical examination without abnormal findings: Secondary | ICD-10-CM | POA: Diagnosis not present

## 2021-04-11 DIAGNOSIS — I10 Essential (primary) hypertension: Secondary | ICD-10-CM | POA: Diagnosis not present

## 2021-04-11 DIAGNOSIS — L732 Hidradenitis suppurativa: Secondary | ICD-10-CM

## 2021-04-11 DIAGNOSIS — M25559 Pain in unspecified hip: Secondary | ICD-10-CM

## 2021-04-11 DIAGNOSIS — I509 Heart failure, unspecified: Secondary | ICD-10-CM | POA: Diagnosis not present

## 2021-04-11 DIAGNOSIS — F1010 Alcohol abuse, uncomplicated: Secondary | ICD-10-CM

## 2021-04-11 DIAGNOSIS — R252 Cramp and spasm: Secondary | ICD-10-CM

## 2021-04-11 LAB — POCT GLYCOSYLATED HEMOGLOBIN (HGB A1C): Hemoglobin A1C: 4.8 % (ref 4.0–5.6)

## 2021-04-11 MED ORDER — HYDROCHLOROTHIAZIDE 25 MG PO TABS: 25.0000 mg | ORAL_TABLET | Freq: Every day | ORAL | 1 refills | Status: DC

## 2021-04-11 MED ORDER — NITROGLYCERIN 0.3 MG SL SUBL: 0.3000 mg | SUBLINGUAL_TABLET | SUBLINGUAL | 3 refills | Status: DC | PRN

## 2021-04-11 MED ORDER — GABAPENTIN 600 MG PO TABS: 600.0000 mg | ORAL_TABLET | Freq: Two times a day (BID) | ORAL | 1 refills | Status: DC

## 2021-04-11 MED ORDER — CARVEDILOL 25 MG PO TABS: 25.0000 mg | ORAL_TABLET | Freq: Two times a day (BID) | ORAL | 1 refills | Status: DC

## 2021-04-11 MED ORDER — DULOXETINE HCL 60 MG PO CPEP: 60.0000 mg | ORAL_CAPSULE | Freq: Every day | ORAL | 1 refills | Status: DC

## 2021-04-11 MED ORDER — FUROSEMIDE 20 MG PO TABS: 20.0000 mg | ORAL_TABLET | Freq: Every day | ORAL | 0 refills | Status: DC

## 2021-04-11 MED ORDER — CLONIDINE HCL 0.1 MG PO TABS
0.2000 mg | ORAL_TABLET | Freq: Once | ORAL | Status: AC
  Administered 2021-04-11: 0.2 mg via ORAL

## 2021-04-11 NOTE — Patient Instructions (Signed)
You were seen today in the [facility] for chronic illnesses, hip pain and medications refill. Labs were collected, results will be available via MyChart or, if abnormal, you will be contacted by clinic staff. You were prescribed medications, please take as directed. Please follow up in 1 mth for reevaluation.

## 2021-04-11 NOTE — Progress Notes (Signed)
[facility] [address] [place], [place]  [address] Phone:  [phone]   Fax:  [phone] Subjective:   Patient ID: [name], female    DOB: [birth year], 45 y.o.   MRN: [id number]  Chief Complaint  Patient presents with   Follow-up    Bilateral hip pain. Depression. Needing all medications on file refilled.    HPI [name] 45 y.o. female with extensive medical history, including  has a past medical history of Anemia, CHF (congestive heart failure) (HCC), Common migraine with intractable migraine (01/25/2017), GERD (gastroesophageal reflux disease), Hypertension, Miscarriage, Sickle cell trait (HCC), and Vitamin D deficiency (07/2019). To the [facility] for follow up of chronic illness, medication refill and multiple other complaints.   Patient states that she has not taken her medications in 1-2 mths. States that she is working to improve her health for her daughters and is wanting to restart all medications previously prescribed. Has not completed follow up with cardiologist in several months, due to unknown reasons, states that she will be scheduling a follow up in the upcoming weeks.  Complaining of chronic bilateral hip pain, which she was prescribed gabapentin for in the past. States that gabapentin was not effective for managing pain. When evaluated in the ED for pain, states, "I was told I have little cartilage left in my hips and may need a hip replacement. My brother just had both his hips replaced." Rates pain in hip 12/10 and describes as throbbing. Pain in right hip worst than left, increasing with ambulation. States," When I'm walking, it feels like my right hip pops in and out. Sometimes I fall."  Patient also endorses having recent depression, due to multiple deaths in the past month, including her best friend. Depression and grief have caused her to drink alcohol more frequently. Currently drinks 1 pint to 0.5 pint per day. Last drink 1 day ago.   Denies any withdrawal symptoms.  Endorses chest pain without shortness of breath. States that this is chronic and mild today. Has not had prescribed nitroglycerin in several weeks, requesting refill.   Also complaining of "bumps" under bilateral arms. States that she has had for 10 yrs, and recently worsened. Has not worn deodorant in 1 yr, due to worsening of symptoms with deodorant. Endorses some drainage from bumps in the past, but none recently. Effected are tender to palpation.   Endorses cramps in the BLE that she has had for multiple years. States that she would like reevaluation and treatment of cramps. Denies any during visit, mostly occurs at night. Was prescribed muscle relaxants in the past, with minimal relief.   Denies any recent trauma or injury. Denies any fever. Denies any fatigue, chest pain, shortness of breath, HA or dizziness. Denies any blurred vision, numbness or tingling.   Past Medical History:  Diagnosis Date   Anemia    CHF (congestive heart failure) (HCC)    Common migraine with intractable migraine 01/25/2017   GERD (gastroesophageal reflux disease)    Hypertension    Miscarriage    x3   Sickle cell trait (HCC)    Vitamin D deficiency 07/2019    Past Surgical History:  Procedure Laterality Date   BREAST SURGERY     cyst removal 1990   DILATION AND CURETTAGE OF UTERUS     four miscarriages   TUBAL LIGATION Bilateral 08/17/2012   Procedure: POST PARTUM TUBAL LIGATION;  Surgeon: Allie Bossier, MD;  Location: [facility];  Service: Gynecology;  Laterality: Bilateral;    Family History  Problem Relation Age of Onset   Hypertension Mother    Hypertension Maternal Grandmother    Hypertension Maternal Grandfather    Other Neg Hx     Social History   Socioeconomic History   Marital status: Single    Spouse name: Not on file   Number of children: 2   Years of education: 11   Highest education level: Not on file  Occupational History   Occupation:  Unemployed  Tobacco Use   Smoking status: Every Day    Packs/day: 0.30    Years: 10.00    Pack years: 3.00    Types: Cigarettes   Smokeless tobacco: Never  Vaping Use   Vaping Use: Never used  Substance and Sexual Activity   Alcohol use: Yes    Comment: History of heavy alcohol use until 5/18   Drug use: No   Sexual activity: Yes    Birth control/protection: None  Other Topics Concern   Not on file  Social History Narrative   Lives with mother, Elease Hashimoto   Caffeine use: Tea daily   Left handed   Social Determinants of Health   Financial Resource Strain: Not on file  Food Insecurity: Not on file  Transportation Needs: Not on file  Physical Activity: Not on file  Stress: Not on file  Social Connections: Not on file  Intimate Partner Violence: Not on file    Outpatient Medications Prior to Visit  Medication Sig Dispense Refill   albuterol (VENTOLIN HFA) 108 (90 Base) MCG/ACT inhaler Inhale 2 puffs into the lungs every 4 (four) hours as needed for wheezing or shortness of breath (cough, shortness of breath or wheezing.). 18 g 11   budesonide-formoterol (SYMBICORT) 80-4.5 MCG/ACT inhaler Inhale 2 puffs into the lungs 2 (two) times daily. 1 Inhaler 11   buPROPion (WELLBUTRIN SR) 150 MG 12 hr tablet Take 1 tablet (150 mg total) by mouth 2 (two) times daily. 180 tablet 3   cyclobenzaprine (FLEXERIL) 10 MG tablet Take 1 tablet (10 mg total) by mouth 2 (two) times daily as needed for muscle spasms. 30 tablet 3   ibuprofen (ADVIL) 800 MG tablet Take 1 tablet (800 mg total) by mouth every 8 (eight) hours as needed. 30 tablet 2   lidocaine (LIDODERM) 5 % Place 1 patch onto the skin daily. Remove & Discard patch within 12 hours or as directed by MD 30 patch 0   methocarbamol (ROBAXIN) 500 MG tablet Take 1 tablet (500 mg total) by mouth 2 (two) times daily. 20 tablet 0   RESTASIS 0.05 % ophthalmic emulsion Place 1 drop into both eyes as directed. 0.4 mL 3   Vitamin D, Ergocalciferol,  (DRISDOL) 1.25 MG (50000 UNIT) CAPS capsule Take 1 capsule (50,000 Units total) by mouth every 7 (seven) days. 5 capsule 6   nitroGLYCERIN (NITROSTAT) 0.3 MG SL tablet Place 1 tablet (0.3 mg total) under the tongue every 5 (five) minutes as needed for chest pain. 90 tablet 3   famotidine (PEPCID) 20 MG tablet Take 1 tablet (20 mg total) by mouth 2 (two) times daily. 60 tablet 0   hydrALAZINE (APRESOLINE) 25 MG tablet Take 1 tablet (25 mg total) by mouth 3 (three) times daily. 90 tablet 0   carvedilol (COREG) 25 MG tablet Take 1 tablet (25 mg total) by mouth 2 (two) times daily with a meal. 60 tablet 1   furosemide (LASIX) 20 MG tablet Take 1 tablet (20  mg total) by mouth daily. 30 tablet 0   gabapentin (NEURONTIN) 300 MG capsule Take 1 capsule (300 mg total) by mouth 3 (three) times daily. TAKE 1 CAPSULE BY MOUTH THREE TIMES A DAY 90 capsule 0   No facility-administered medications prior to visit.    No Known Allergies  Review of Systems  Constitutional:  Negative for chills, fever and malaise/fatigue.  HENT: Negative.    Eyes: Negative.   Respiratory:  Negative for cough and shortness of breath.   Cardiovascular:  Positive for chest pain. Negative for palpitations and leg swelling.  Gastrointestinal:  Negative for abdominal pain, blood in stool, constipation, diarrhea, nausea and vomiting.  Genitourinary: Negative.   Musculoskeletal:  Positive for falls and joint pain.       Muscle cramps  Skin: Negative.   Neurological: Negative.   Psychiatric/Behavioral:  Positive for depression and substance abuse. Negative for hallucinations and suicidal ideas. The patient is not nervous/anxious.   All other systems reviewed and are negative.     Objective:    Physical Exam Vitals reviewed.  Constitutional:      General: She is not in acute distress.    Appearance: Normal appearance. She is normal weight.     Comments: Patient tearful during visit. Ambulates with a limp due to pain in right  hip.  HENT:     Head: Normocephalic.     Right Ear: Tympanic membrane, ear canal and external ear normal.     Left Ear: Tympanic membrane, ear canal and external ear normal.     Nose: Nose normal.     Mouth/Throat:     Mouth: Mucous membranes are moist.     Pharynx: Oropharynx is clear.  Eyes:     Extraocular Movements: Extraocular movements intact.     Conjunctiva/sclera: Conjunctivae normal.     Pupils: Pupils are equal, round, and reactive to light.  Cardiovascular:     Rate and Rhythm: Normal rate and regular rhythm.     Pulses: Normal pulses.     Heart sounds: Normal heart sounds.     Comments: No obvious peripheral edema Pulmonary:     Effort: Pulmonary effort is normal.     Breath sounds: Normal breath sounds.  Musculoskeletal:        General: No swelling, tenderness, deformity or signs of injury. Normal range of motion.     Cervical back: Normal range of motion and neck supple.     Right lower leg: No edema.     Left lower leg: No edema.  Skin:    General: Skin is warm and dry.     Capillary Refill: Capillary refill takes less than 2 seconds.     Comments: Less than 0.5 cm palpable masses noted to bilateral axillary areas. Mild tenderness with palpation. No drainage or erythema noted.  Neurological:     General: No focal deficit present.     Mental Status: She is alert and oriented to person, place, and time. Mental status is at baseline.     Cranial Nerves: No cranial nerve deficit.     Motor: No weakness.     Coordination: Coordination normal.     Gait: Gait abnormal.     Deep Tendon Reflexes: Reflexes normal.  Psychiatric:        Mood and Affect: Mood normal.        Behavior: Behavior normal.        Thought Content: Thought content normal.        Judgment:  Judgment normal.    BP (!) 168/101 (BP Location: Left Arm, Patient Position: Sitting)   Pulse 92   Temp 97.7 F (36.5 C)   Ht 5\' 4"  (1.626 m)   Wt 111 lb 0.6 oz (50.4 kg)   LMP 03/24/2021   SpO2 100%    BMI 19.06 kg/m  Wt Readings from Last 3 Encounters:  04/11/21 111 lb 0.6 oz (50.4 kg)  04/12/20 118 lb (53.5 kg)  01/19/20 120 lb (54.4 kg)    Immunization History  Administered Date(s) Administered   Influenza Split 05/23/2012   Influenza,inj,Quad PF,6+ Mos 02/20/2017, 04/08/2018   Pneumococcal Polysaccharide-23 08/18/2012, 11/09/2016   Tdap 06/13/2012    Diabetic Foot Exam - Simple   No data filed     Lab Results  Component Value Date   TSH 1.540 01/19/2020   Lab Results  Component Value Date   WBC 5.7 02/04/2021   HGB 12.3 02/04/2021   HCT 37.2 02/04/2021   MCV 89.6 02/04/2021   PLT 268 02/04/2021   Lab Results  Component Value Date   NA 137 02/04/2021   K 4.3 02/04/2021   CO2 20 (L) 02/04/2021   GLUCOSE 97 02/04/2021   BUN 15 02/04/2021   CREATININE 0.70 02/04/2021   BILITOT 0.5 02/04/2021   ALKPHOS 67 02/04/2021   AST 22 02/04/2021   ALT 18 02/04/2021   PROT 6.6 02/04/2021   ALBUMIN 3.6 02/04/2021   CALCIUM 9.1 02/04/2021   ANIONGAP 11 02/04/2021   Lab Results  Component Value Date   CHOL 179 01/19/2020   CHOL 247 (H) 11/13/2018   CHOL 189 04/08/2018   Lab Results  Component Value Date   HDL 84 01/19/2020   HDL 91 11/13/2018   HDL 84 04/08/2018   Lab Results  Component Value Date   LDLCALC 80 01/19/2020   LDLCALC 108 (H) 11/13/2018   LDLCALC 82 04/08/2018   Lab Results  Component Value Date   TRIG 80 01/19/2020   TRIG 239 (H) 11/13/2018   TRIG 115 04/08/2018   Lab Results  Component Value Date   CHOLHDL 2.1 01/19/2020   CHOLHDL 2.7 11/13/2018   CHOLHDL 2.3 04/08/2018   Lab Results  Component Value Date   HGBA1C 4.8 04/11/2021   HGBA1C 4.7 01/19/2020   HGBA1C 4.7 01/19/2020   HGBA1C 4.7 (A) 01/19/2020   HGBA1C 4.7 01/19/2020       Assessment & Plan:   Problem List Items Addressed This Visit       Cardiovascular and Mediastinum   Hypertension   Relevant Medications   furosemide (LASIX) 20 MG tablet   carvedilol  (COREG) 25 MG tablet   nitroGLYCERIN (NITROSTAT) 0.3 MG SL tablet   hydrochlorothiazide (HYDRODIURIL) 25 MG tablet Hypertensive Urgency  Clonidine given with no improvement in B/P Patient signed waiver indicating she would seek additional assistance for B/P psot discharge and take prescribed medications  Discussed red flag symptoms that require immediate intervention in clinic and/ ED     Other   Chest pain   Relevant Medications   nitroGLYCERIN (NITROSTAT) 0.3 MG SL tablet   Other Relevant Orders   EKG 12-Lead: EKG: normal EKG, normal sinus rhythm, unchanged from previous tracings.    Chest discomfort   Relevant Medications   nitroGLYCERIN (NITROSTAT) 0.3 MG SL tablet   Other Visit Diagnoses     Health care maintenance    -  Primary   Relevant Orders   CBC with Differential/Platelet   Comprehensive metabolic panel  Lipid panel   POCT glycosylated hemoglobin (Hb A1C) (Completed)   Chronic congestive heart failure, unspecified heart failure type (HCC)       Relevant Medications   furosemide (LASIX) 20 MG tablet   carvedilol (COREG) 25 MG tablet   nitroGLYCERIN (NITROSTAT) 0.3 MG SL tablet   cloNIDine (CATAPRES) tablet 0.2 mg (Completed)   hydrochlorothiazide (HYDRODIURIL) 25 MG tablet   Hip pain       Relevant Medications   gabapentin (NEURONTIN) 600 MG tablet Increased dosage of gabapentin  Concerned for worsening arthralgia, referred to ortho further evaluation and management of pain   Other Relevant Orders   AMB referral to orthopedics   XR HIPS BILAT W OR W/O PELVIS MIN 5 VIEWS   Other depression       Relevant Medications   DULoxetine (CYMBALTA) 60 MG capsule   Other Relevant Orders   Ambulatory referral to Psychiatry   Alcohol abuse       Relevant Orders   Vitamin D, 25-hydroxy   Vitamin B12 Discussed importance of refraining from alcohol usage and potential risk factors of alcohol abuse    Essential hypertension       Relevant Medications   furosemide  (LASIX) 20 MG tablet   carvedilol (COREG) 25 MG tablet   nitroGLYCERIN (NITROSTAT) 0.3 MG SL tablet   cloNIDine (CATAPRES) tablet 0.2 mg (Completed)   hydrochlorothiazide (HYDRODIURIL) 25 MG tablet Added HCTZ to therapy  Encouraged medication compliance Discussed the importance of follow up with cardiology    Complicated grief     Referred to in clinic LCSW Darl Pikes for further evaluation of for grief with/ without depression  Referred to psychiatry  Bilateral leg Cramps, Chronic Concerned for electrolyte abnormality v other musculoskeletal etiology  Labs ordered  Patient informed to continue taking muscle relaxants as needed in addition to OTC medications  Will review completed labs and reevaluate symptoms at follow up    Hidradenitis suppurativa       Relevant Orders   Ambulatory referral to Dermatology No acute infection identified during exam, will continue to monitor until reevaluation and further treatment completed by dermatology   Follow up in 1 mth for reevaluation of symptoms, sooner as needed    I have discontinued Malak L. Dusek's gabapentin. I am also having her start on gabapentin, DULoxetine, and hydrochlorothiazide. Additionally, I am having her maintain her Restasis, ibuprofen, Vitamin D (Ergocalciferol), albuterol, budesonide-formoterol, buPROPion, cyclobenzaprine, lidocaine, methocarbamol, famotidine, hydrALAZINE, furosemide, carvedilol, and nitroGLYCERIN. We administered cloNIDine.  Meds ordered this encounter  Medications   furosemide (LASIX) 20 MG tablet    Sig: Take 1 tablet (20 mg total) by mouth daily.    Dispense:  30 tablet    Refill:  0   carvedilol (COREG) 25 MG tablet    Sig: Take 1 tablet (25 mg total) by mouth 2 (two) times daily with a meal.    Dispense:  60 tablet    Refill:  1   gabapentin (NEURONTIN) 600 MG tablet    Sig: Take 1 tablet (600 mg total) by mouth 2 (two) times daily.    Dispense:  60 tablet    Refill:  1   DULoxetine (CYMBALTA)  60 MG capsule    Sig: Take 1 capsule (60 mg total) by mouth daily.    Dispense:  30 capsule    Refill:  1   nitroGLYCERIN (NITROSTAT) 0.3 MG SL tablet    Sig: Place 1 tablet (0.3 mg total) under the tongue every 5 (five)  minutes as needed for chest pain.    Dispense:  90 tablet    Refill:  3   cloNIDine (CATAPRES) tablet 0.2 mg   hydrochlorothiazide (HYDRODIURIL) 25 MG tablet    Sig: Take 1 tablet (25 mg total) by mouth daily.    Dispense:  30 tablet    Refill:  1     Kathrynn Speed, NP

## 2021-04-12 ENCOUNTER — Other Ambulatory Visit: Payer: Self-pay | Admitting: Nurse Practitioner

## 2021-04-12 ENCOUNTER — Telehealth: Payer: Self-pay | Admitting: Clinical

## 2021-04-12 DIAGNOSIS — E559 Vitamin D deficiency, unspecified: Secondary | ICD-10-CM

## 2021-04-12 LAB — COMPREHENSIVE METABOLIC PANEL
ALT: 99 IU/L — ABNORMAL HIGH (ref 0–32)
AST: 78 IU/L — ABNORMAL HIGH (ref 0–40)
Albumin/Globulin Ratio: 1.5 (ref 1.2–2.2)
Albumin: 4.4 g/dL (ref 3.8–4.8)
Alkaline Phosphatase: 138 IU/L — ABNORMAL HIGH (ref 44–121)
BUN/Creatinine Ratio: 9 (ref 9–23)
BUN: 6 mg/dL (ref 6–24)
Bilirubin Total: 0.3 mg/dL (ref 0.0–1.2)
CO2: 17 mmol/L — ABNORMAL LOW (ref 20–29)
Calcium: 9.5 mg/dL (ref 8.7–10.2)
Chloride: 106 mmol/L (ref 96–106)
Creatinine, Ser: 0.65 mg/dL (ref 0.57–1.00)
Globulin, Total: 2.9 g/dL (ref 1.5–4.5)
Glucose: 76 mg/dL (ref 70–99)
Potassium: 4.2 mmol/L (ref 3.5–5.2)
Sodium: 145 mmol/L — ABNORMAL HIGH (ref 134–144)
Total Protein: 7.3 g/dL (ref 6.0–8.5)
eGFR: 112 mL/min/{1.73_m2} (ref >59)

## 2021-04-12 LAB — LIPID PANEL
Chol/HDL Ratio: 2 ratio (ref 0.0–4.4)
Cholesterol, Total: 197 mg/dL (ref 100–199)
HDL: 101 mg/dL (ref >39)
LDL Chol Calc (NIH): 79 mg/dL (ref 0–99)
Triglycerides: 96 mg/dL (ref 0–149)
VLDL Cholesterol Cal: 17 mg/dL (ref 5–40)

## 2021-04-12 LAB — CBC WITH DIFFERENTIAL/PLATELET
Basophils Absolute: 0.1 10*3/uL (ref 0.0–0.2)
Basos: 1 %
EOS (ABSOLUTE): 0.2 10*3/uL (ref 0.0–0.4)
Eos: 3 %
Hematocrit: 39.4 % (ref 34.0–46.6)
Hemoglobin: 12.5 g/dL (ref 11.1–15.9)
Immature Grans (Abs): 0 10*3/uL (ref 0.0–0.1)
Immature Granulocytes: 1 %
Lymphocytes Absolute: 1.6 10*3/uL (ref 0.7–3.1)
Lymphs: 22 %
MCH: 28.5 pg (ref 26.6–33.0)
MCHC: 31.7 g/dL (ref 31.5–35.7)
MCV: 90 fL (ref 79–97)
Monocytes Absolute: 0.6 10*3/uL (ref 0.1–0.9)
Monocytes: 8 %
Neutrophils Absolute: 4.8 10*3/uL (ref 1.4–7.0)
Neutrophils: 65 %
Platelets: 338 10*3/uL (ref 150–450)
RBC: 4.38 x10E6/uL (ref 3.77–5.28)
RDW: 13.8 % (ref 11.7–15.4)
WBC: 7.3 10*3/uL (ref 3.4–10.8)

## 2021-04-12 LAB — VITAMIN D 25 HYDROXY (VIT D DEFICIENCY, FRACTURES): Vit D, 25-Hydroxy: 19.1 ng/mL — ABNORMAL LOW (ref 30.0–100.0)

## 2021-04-12 LAB — VITAMIN B12: Vitamin B-12: 558 pg/mL (ref 232–1245)

## 2021-04-12 MED ORDER — VITAMIN D (ERGOCALCIFEROL) 1.25 MG (50000 UNIT) PO CAPS: 50000.0000 [IU] | ORAL_CAPSULE | ORAL | 1 refills | Status: AC

## 2021-04-12 NOTE — Telephone Encounter (Signed)
Integrated Behavioral Health Case Management Referral Note  04/12/2021 Name: [name] MRN: [id number] DOB: [birth year] [name] is a 45 y.o. year old female who sees [place], Enid Derry I, NP for primary care. LCSW was consulted to assess patient's needs and assist the patient with Mental Health Counseling and Resources.  Interpreter: No.   Interpreter Name & Language: none  Assessment: Patient experiencing Mental Health Concerns and substance use issues with alcohol.  Intervention: CSW called patient to follow up on referral from PCP after patient's visit yesterday. Patient reported she needs mental health support and is also interested in detox for her alcohol use. CSW and patient made appointment for tomorrow, 04/13/21 to discuss this in the [facility]) office.   Review of patient status, including review of consultants reports, relevant laboratory and other test results, and collaboration with appropriate care team members and the patient's provider was performed as part of comprehensive patient evaluation and provision of services.    Abigail Butts, LCSW [facility] [phone]

## 2021-04-13 ENCOUNTER — Encounter: Payer: Self-pay | Admitting: Orthopaedic Surgery

## 2021-04-13 ENCOUNTER — Other Ambulatory Visit: Payer: Self-pay

## 2021-04-13 ENCOUNTER — Ambulatory Visit: Payer: Self-pay

## 2021-04-13 ENCOUNTER — Other Ambulatory Visit: Payer: Self-pay | Admitting: Clinical

## 2021-04-13 ENCOUNTER — Ambulatory Visit (INDEPENDENT_AMBULATORY_CARE_PROVIDER_SITE_OTHER): Payer: Medicaid Other | Admitting: Orthopaedic Surgery

## 2021-04-13 DIAGNOSIS — M25551 Pain in right hip: Secondary | ICD-10-CM | POA: Diagnosis not present

## 2021-04-13 DIAGNOSIS — M25552 Pain in left hip: Secondary | ICD-10-CM | POA: Diagnosis not present

## 2021-04-13 DIAGNOSIS — M545 Low back pain, unspecified: Secondary | ICD-10-CM

## 2021-04-13 MED ORDER — HYDROCODONE-ACETAMINOPHEN 7.5-325 MG PO TABS: 1.0000 | ORAL_TABLET | Freq: Three times a day (TID) | ORAL | 0 refills | Status: DC | PRN

## 2021-04-13 NOTE — Progress Notes (Signed)
Office Visit Note   Patient: [name]           Date of Birth: Mar 28, 1976           MRN: [id number] Visit Date: 04/13/2021              Requested by: Orion Crook I, NP [address] [place],  [place] [address] PCP: Orion Crook I, NP   Assessment & Plan: Visit Diagnoses:  1. Low back pain, unspecified back pain laterality, unspecified chronicity, unspecified whether sciatica present   2. Pain in right hip   3. Pain in left hip     Plan: Impression is bilateral hip pain concerning for avascular necrosis with the patient having sickle cell trait.  We provided her with a prescription for a rolling walker and a handicap placard.  I have written her out of work for the rest of the week.  Hydrocodone prescribed for the pain.  Lumbar spine x-rays and exam are unrevealing.  We will need to obtain an MRI of the pelvis to assess for AVN.  Follow-up after the MRI.  Follow-Up Instructions: No follow-ups on file.   Orders:  Orders Placed This Encounter  Procedures   XR HIP UNILAT W OR W/O PELVIS 2-3 VIEWS RIGHT   XR HIP UNILAT W OR W/O PELVIS 2-3 VIEWS LEFT   XR Lumbar Spine 2-3 Views   MR Pelvis w/o contrast   Meds ordered this encounter  Medications   HYDROcodone-acetaminophen (NORCO) 7.5-325 MG tablet    Sig: Take 1-2 tablets by mouth 3 (three) times daily as needed for moderate pain.    Dispense:  30 tablet    Refill:  0      Procedures: No procedures performed   Clinical Data: No additional findings.   Subjective: Chief Complaint  Patient presents with   Right Hip - New Patient (Initial Visit)   Left Hip - New Patient (Initial Visit)    [name] is a 45 year old female with sickle cell trait who comes in for bilateral hip pain rated 10 out of 10 that is worse on the right side.  She originally started having pain about 5 years ago which is now gradually gotten worse.  She has been unable to work due to the severe pain.  She use to get relief from  gabapentin which is no longer effective.  Denies any trauma or prior sickle cell crisis events.  She is barely able to ambulate due to the pain.  Denies any back pain or radicular symptoms.   Review of Systems  Constitutional: Negative.   HENT: Negative.    Eyes: Negative.   Respiratory: Negative.    Cardiovascular: Negative.   Endocrine: Negative.   Musculoskeletal: Negative.   Neurological: Negative.   Hematological: Negative.   Psychiatric/Behavioral: Negative.    All other systems reviewed and are negative.   Objective: Vital Signs: LMP 03/24/2021   Physical Exam Vitals and nursing note reviewed.  Constitutional:      Appearance: She is well-developed.  Pulmonary:     Effort: Pulmonary effort is normal.  Skin:    General: Skin is warm.     Capillary Refill: Capillary refill takes less than 2 seconds.  Neurological:     Mental Status: She is alert and oriented to person, place, and time.  Psychiatric:        Behavior: Behavior normal.        Thought Content: Thought content normal.  Judgment: Judgment normal.    Ortho Exam  Bilateral hips show lots of guarding secondary to pain.  I am unable to really passively range her hips.  There is no neurovascular compromise.  No tenderness to palpation.  Specialty Comments:  No specialty comments available.  Imaging: XR HIP UNILAT W OR W/O PELVIS 2-3 VIEWS LEFT  Result Date: 04/13/2021 Questionable sclerotic margins in the femoral head.  Joint space is well-preserved  XR HIP UNILAT W OR W/O PELVIS 2-3 VIEWS RIGHT  Result Date: 04/13/2021 Questionable sclerotic margins of the femoral head.  Joint spaces well-preserved.  XR Lumbar Spine 2-3 Views  Result Date: 04/13/2021 No acute or structural abnormalities.    PMFS History: Patient Active Problem List   Diagnosis Date Noted   Acute bilateral low back pain without sciatica 01/22/2020   Bilateral leg cramps 12/19/2018   Vitamin D deficiency 12/19/2018    Chest discomfort 11/14/2018   Wrist strain, right, initial encounter 11/14/2018   Common migraine with intractable migraine 01/25/2017   Hypertension 12/27/2016   Chronic combined systolic and diastolic congestive heart failure (HCC) 11/09/2016   Chest pain 11/08/2016   Hypertensive urgency 11/08/2016   Admission for sterilization 08/17/2012   Spontaneous vaginal delivery 08/16/2012   Consultation for sterilization 07/25/2012   Poor fetal growth, affecting management of mother, antepartum condition or complication 07/11/2012   Chronic hypertension complicating or reason for care during pregnancy 04/30/2012   Supervision of high-risk pregnancy 04/30/2012   Sickle cell trait (HCC) 04/30/2012   Past Medical History:  Diagnosis Date   Anemia    CHF (congestive heart failure) (HCC)    Common migraine with intractable migraine 01/25/2017   GERD (gastroesophageal reflux disease)    Hypertension    Miscarriage    x3   Sickle cell trait (HCC)    Vitamin D deficiency 07/2019    Family History  Problem Relation Age of Onset   Hypertension Mother    Hypertension Maternal Grandmother    Hypertension Maternal Grandfather    Other Neg Hx     Past Surgical History:  Procedure Laterality Date   BREAST SURGERY     cyst removal 1990   DILATION AND CURETTAGE OF UTERUS     four miscarriages   TUBAL LIGATION Bilateral 08/17/2012   Procedure: POST PARTUM TUBAL LIGATION;  Surgeon: Allie Bossier, MD;  Location: [facility];  Service: Gynecology;  Laterality: Bilateral;   Social History   Occupational History   Occupation: Unemployed  Tobacco Use   Smoking status: Every Day    Packs/day: 0.30    Years: 10.00    Pack years: 3.00    Types: Cigarettes   Smokeless tobacco: Never  Vaping Use   Vaping Use: Never used  Substance and Sexual Activity   Alcohol use: Yes    Comment: History of heavy alcohol use until 5/18   Drug use: No   Sexual activity: Yes    Birth control/protection: None

## 2021-04-15 ENCOUNTER — Other Ambulatory Visit: Payer: Self-pay | Admitting: Clinical

## 2021-04-22 ENCOUNTER — Other Ambulatory Visit: Payer: Self-pay | Admitting: Physician Assistant

## 2021-04-22 ENCOUNTER — Telehealth: Payer: Self-pay | Admitting: *Deleted

## 2021-04-22 MED ORDER — DIAZEPAM 2 MG PO TABS: ORAL_TABLET | ORAL | 0 refills | Status: AC

## 2021-04-22 NOTE — Telephone Encounter (Signed)
Called patient. Patient aware.

## 2021-04-22 NOTE — Telephone Encounter (Signed)
Pt is having MRI done on 05/04/21 and states is claustrophobic and would like to have something called in to help relax her. Please advise  CB [phone]

## 2021-04-22 NOTE — Telephone Encounter (Signed)
Sent in

## 2021-04-24 IMAGING — DX DG CHEST 2V
2 series · 2 of 2 positions shown · non-contrast
Comparison: December 04, 2019

CLINICAL DATA: Chest pain

EXAM:
CHEST - 2 VIEW

[chest pa]
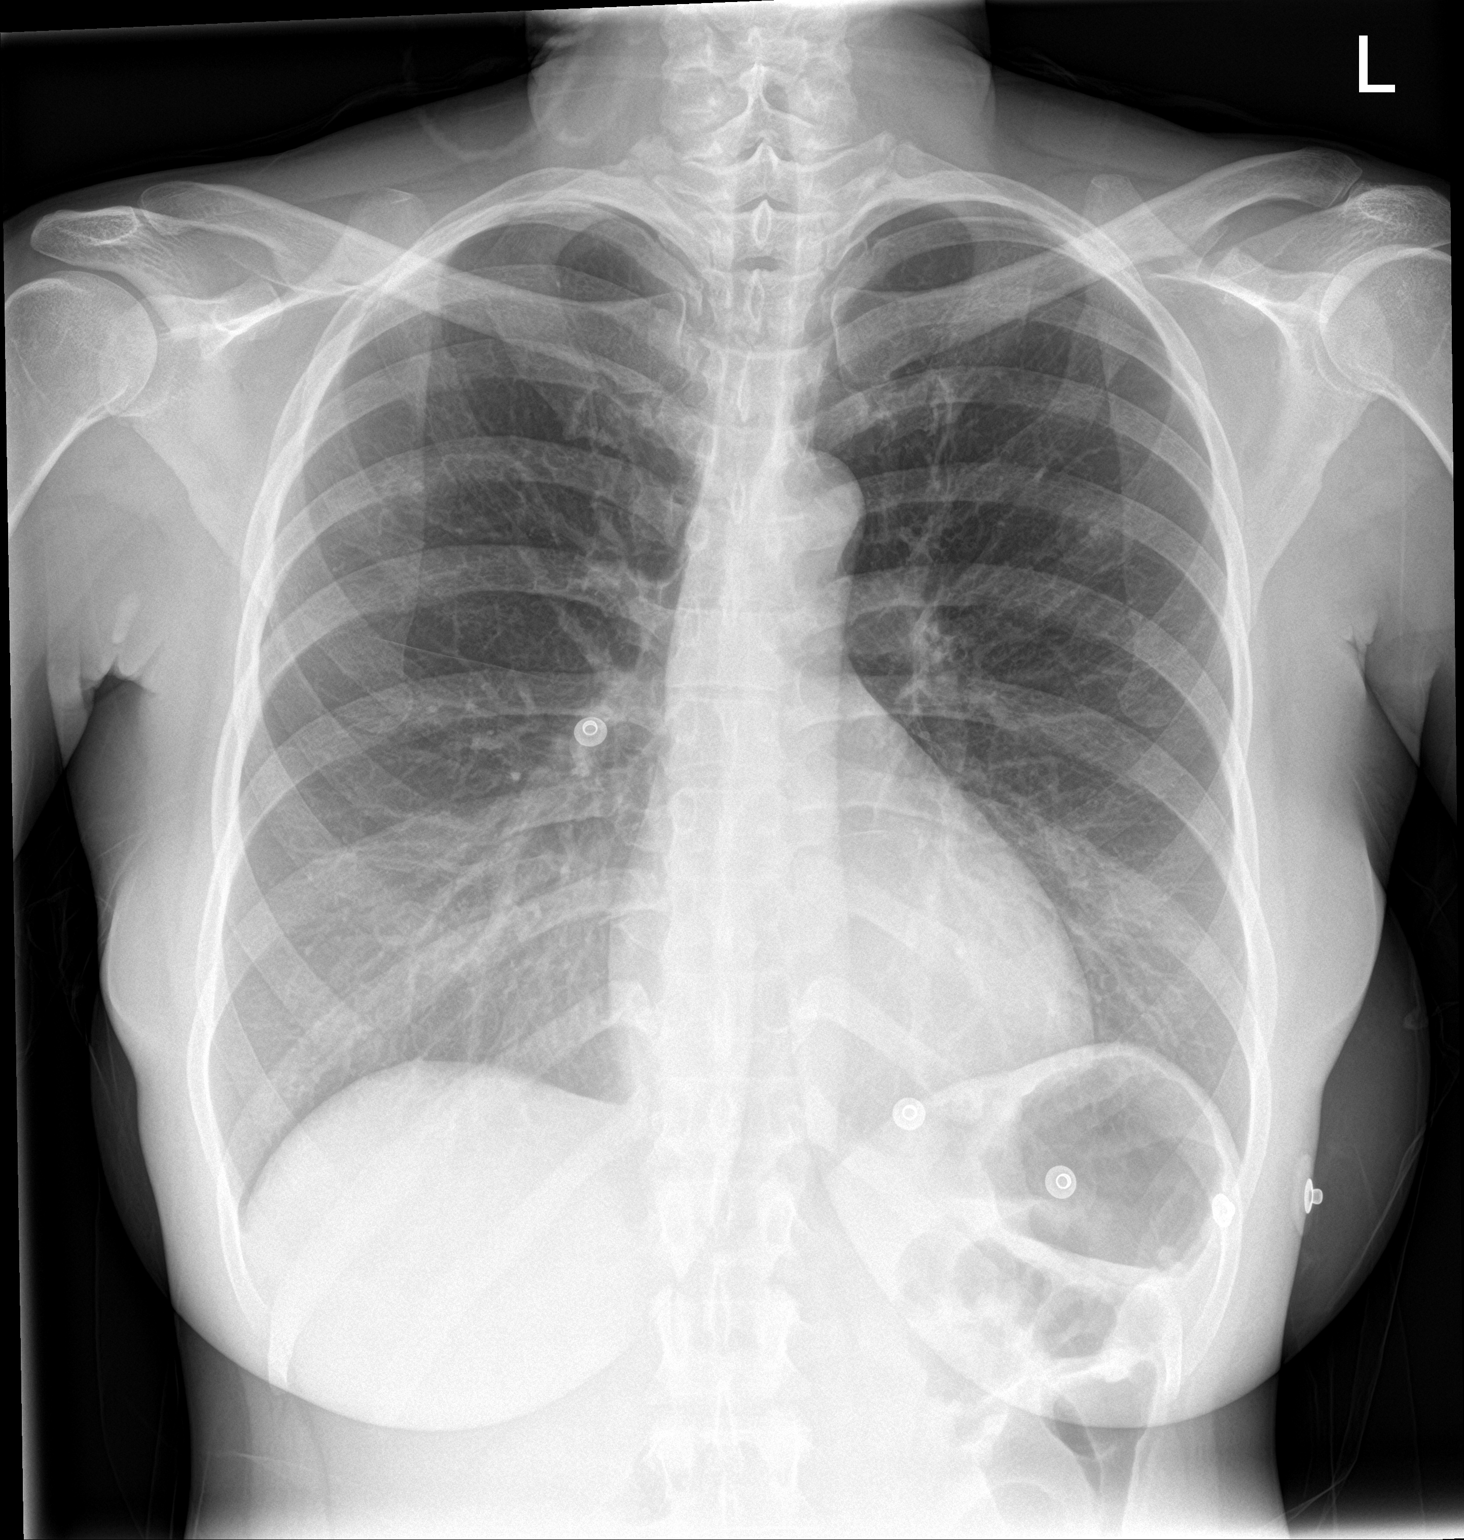

[chest lat]
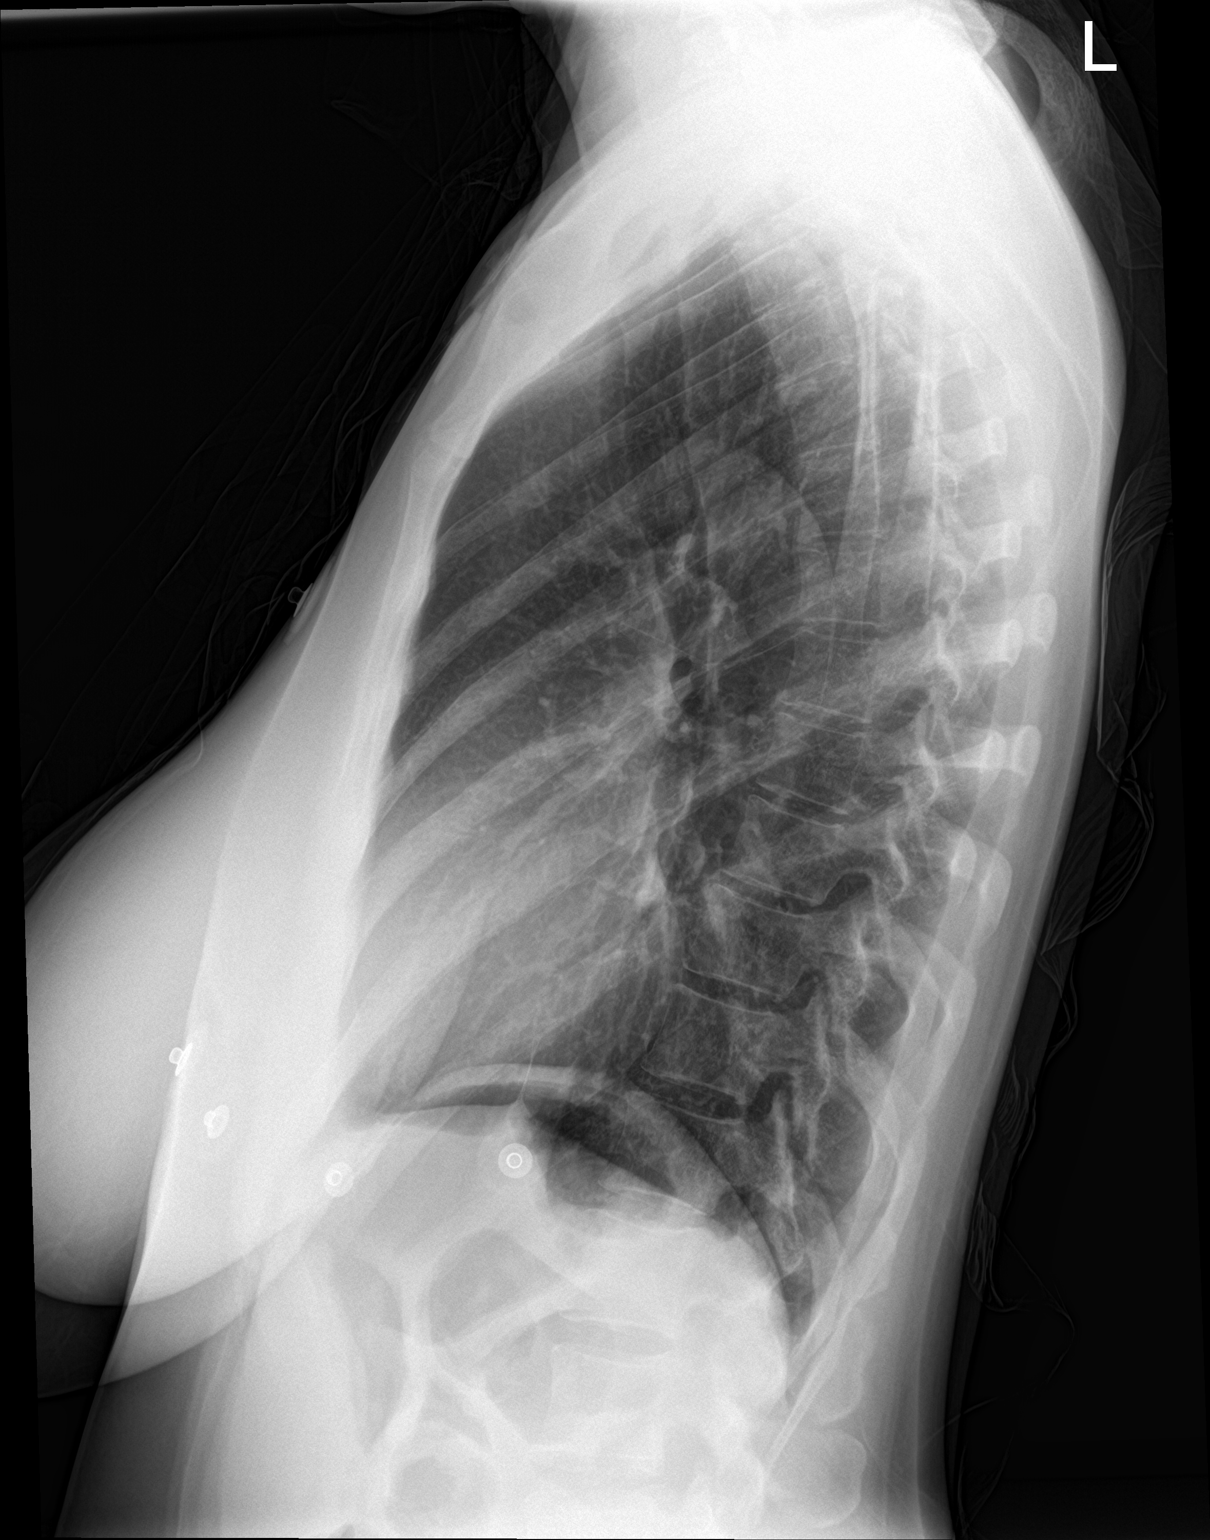

[2 of 2 positions shown; findings below may reference images not displayed]

FINDINGS: Lungs are clear. Heart size and pulmonary vascularity are normal. No
adenopathy. No pneumothorax. No bone lesions. There are overlying
monitor leads.
IMPRESSION: Lungs clear.  Cardiac silhouette normal.

## 2021-05-04 ENCOUNTER — Ambulatory Visit
Admission: RE | Admit: 2021-05-04 | Discharge: 2021-05-04 | Disposition: A | Discharge status: Home / Self Care | Payer: Medicaid Other | Source: Ambulatory Visit | Attending: Orthopaedic Surgery | Admitting: Orthopaedic Surgery

## 2021-05-04 DIAGNOSIS — M25551 Pain in right hip: Secondary | ICD-10-CM

## 2021-05-04 DIAGNOSIS — M25552 Pain in left hip: Secondary | ICD-10-CM

## 2021-05-06 ENCOUNTER — Encounter: Payer: Self-pay | Admitting: Orthopaedic Surgery

## 2021-05-06 ENCOUNTER — Other Ambulatory Visit: Payer: Self-pay

## 2021-05-06 ENCOUNTER — Ambulatory Visit: Payer: Medicaid Other | Admitting: Orthopaedic Surgery

## 2021-05-06 DIAGNOSIS — M25552 Pain in left hip: Secondary | ICD-10-CM

## 2021-05-06 DIAGNOSIS — M25551 Pain in right hip: Secondary | ICD-10-CM

## 2021-05-06 DIAGNOSIS — G8929 Other chronic pain: Secondary | ICD-10-CM

## 2021-05-06 MED ORDER — METHYLPREDNISOLONE ACETATE 40 MG/ML IJ SUSP
40.0000 mg | INTRAMUSCULAR | Status: AC | PRN
  Administered 2021-05-06: 40 mg via INTRA_ARTICULAR

## 2021-05-06 MED ORDER — BUPIVACAINE HCL 0.25 % IJ SOLN
2.0000 mL | INTRAMUSCULAR | Status: AC | PRN
  Administered 2021-05-06: 2 mL via INTRA_ARTICULAR

## 2021-05-06 MED ORDER — LIDOCAINE HCL 1 % IJ SOLN
3.0000 mL | INTRAMUSCULAR | Status: AC | PRN
  Administered 2021-05-06: 3 mL

## 2021-05-06 NOTE — Progress Notes (Signed)
Office Visit Note   Patient: [name]           Date of Birth: 11/23/1975           MRN: [id number] Visit Date: 05/06/2021              Requested by: Bo Merino I, NP [place] [address], [place],  [place] [address] PCP: Bo Merino I, NP   Assessment & Plan: Visit Diagnoses:  1. Chronic hip pain, bilateral     Plan: Impression is chronic bilateral lateral hip pain concerning for trochanteric bursitis versus lumbar radiculopathy.  We have discussed trying a diagnostic and hopefully therapeutic trochanter bursa injection to the right, more symptomatic hip today.  If her symptoms significantly improve, she will follow-up for left hip injection.  If she does not get any relief, she will follow-up for further evaluation of her lumbar spine.  Call with concerns or questions in the meantime.  Follow-Up Instructions: Return if symptoms worsen or fail to improve.   Orders:  Orders Placed This Encounter  Procedures   Large Joint Inj    No orders of the defined types were placed in this encounter.     Procedures: Large Joint Inj: R greater trochanter on 05/06/2021 10:27 AM Indications: pain Details: 22 G needle, lateral approach Medications: 3 mL lidocaine 1 %; 2 mL bupivacaine 0.25 %; 40 mg methylPREDNISolone acetate 40 MG/ML     Clinical Data: No additional findings.   Subjective: Chief Complaint  Patient presents with   Left Hip - Follow-up    MRI review   Right Hip - Follow-up    MRI review    HPI patient is a pleasant 45 year old female with underlying sickle cell trait who comes in today to discuss MRI results of her pelvis.  She has been dealing with severe bilateral hip pain for several years.  Recent MRI results show mild degenerative changes to both hips.  No evidence of AVN.  She continues to endorse pain, primarily to the lateral aspect right greater than left.  Pain is worse when she is sleeping on her sides.  She has been taking Norco with little  relief.  Review of Systems as detailed in HPI.  All others reviewed and are negative.   Objective: Vital Signs: There were no vitals taken for this visit.  Physical Exam well-developed well-nourished female in no acute distress.  Alert and oriented x3.  Ortho Exam bilateral hip exam shows marked tenderness to the greater trochanter both sides.  Negative logroll.  Negative straight leg raise.  No focal weakness.  She is neurovascular intact distally.  Specialty Comments:  No specialty comments available.  Imaging: No new imaging   PMFS History: Patient Active Problem List   Diagnosis Date Noted   Acute bilateral low back pain without sciatica 01/22/2020   Bilateral leg cramps 12/19/2018   Vitamin D deficiency 12/19/2018   Chest discomfort 11/14/2018   Wrist strain, right, initial encounter 11/14/2018   Common migraine with intractable migraine 01/25/2017   Hypertension 12/27/2016   Chronic combined systolic and diastolic congestive heart failure ([place]) 11/09/2016   Chest pain 11/08/2016   Hypertensive urgency 11/08/2016   Admission for sterilization 08/17/2012   Spontaneous vaginal delivery 08/16/2012   Consultation for sterilization 07/25/2012   Poor fetal growth, affecting management of mother, antepartum condition or complication [id number]   Chronic hypertension complicating or reason for care during pregnancy 04/30/2012   Supervision of high-risk pregnancy 04/30/2012   Sickle  cell trait (HCC) 04/30/2012   Past Medical History:  Diagnosis Date   Anemia    CHF (congestive heart failure) (HCC)    Common migraine with intractable migraine 01/25/2017   GERD (gastroesophageal reflux disease)    Hypertension    Miscarriage    x3   Sickle cell trait (HCC)    Vitamin D deficiency 07/2019    Family History  Problem Relation Age of Onset   Hypertension Mother    Hypertension Maternal Grandmother    Hypertension Maternal Grandfather    Other Neg Hx     Past Surgical  History:  Procedure Laterality Date   BREAST SURGERY     cyst removal 1990   DILATION AND CURETTAGE OF UTERUS     four miscarriages   TUBAL LIGATION Bilateral 08/17/2012   Procedure: POST PARTUM TUBAL LIGATION;  Surgeon: Allie Bossier, MD;  Location: [facility];  Service: Gynecology;  Laterality: Bilateral;   Social History   Occupational History   Occupation: Unemployed  Tobacco Use   Smoking status: Every Day    Packs/day: 0.30    Years: 10.00    Pack years: 3.00    Types: Cigarettes   Smokeless tobacco: Never  Vaping Use   Vaping Use: Never used  Substance and Sexual Activity   Alcohol use: Yes    Comment: History of heavy alcohol use until 5/18   Drug use: No   Sexual activity: Yes    Birth control/protection: None

## 2021-05-13 ENCOUNTER — Ambulatory Visit: Payer: Self-pay | Admitting: Nurse Practitioner

## 2021-08-17 ENCOUNTER — Ambulatory Visit (HOSPITAL_COMMUNITY): Payer: Medicaid Other | Admitting: Licensed Clinical Social Worker

## 2021-08-23 ENCOUNTER — Ambulatory Visit (HOSPITAL_COMMUNITY): Payer: Medicaid Other | Admitting: Physician Assistant

## 2021-09-02 ENCOUNTER — Other Ambulatory Visit: Payer: Self-pay

## 2021-09-02 ENCOUNTER — Emergency Department (HOSPITAL_COMMUNITY)
Admission: EM | Admit: 2021-09-02 | Discharge: 2021-09-03 | Disposition: A | Discharge status: Other Acute Inpatient Hospital | Payer: Medicaid Other | Attending: Emergency Medicine | Admitting: Emergency Medicine

## 2021-09-02 ENCOUNTER — Encounter (HOSPITAL_COMMUNITY): Payer: Self-pay

## 2021-09-02 DIAGNOSIS — I10 Essential (primary) hypertension: Secondary | ICD-10-CM

## 2021-09-02 DIAGNOSIS — F32A Depression, unspecified: Secondary | ICD-10-CM

## 2021-09-02 DIAGNOSIS — F1010 Alcohol abuse, uncomplicated: Secondary | ICD-10-CM | POA: Insufficient documentation

## 2021-09-02 DIAGNOSIS — F419 Anxiety disorder, unspecified: Secondary | ICD-10-CM | POA: Diagnosis not present

## 2021-09-02 DIAGNOSIS — F329 Major depressive disorder, single episode, unspecified: Secondary | ICD-10-CM | POA: Diagnosis present

## 2021-09-02 DIAGNOSIS — Y908 Blood alcohol level of 240 mg/100 ml or more: Secondary | ICD-10-CM | POA: Diagnosis not present

## 2021-09-02 DIAGNOSIS — K0889 Other specified disorders of teeth and supporting structures: Secondary | ICD-10-CM | POA: Insufficient documentation

## 2021-09-02 DIAGNOSIS — Z20822 Contact with and (suspected) exposure to covid-19: Secondary | ICD-10-CM | POA: Diagnosis not present

## 2021-09-02 LAB — CBC WITH DIFFERENTIAL/PLATELET
Abs Immature Granulocytes: 0.03 10*3/uL (ref 0.00–0.07)
Basophils Absolute: 0.1 10*3/uL (ref 0.0–0.1)
Basophils Relative: 1 %
Eosinophils Absolute: 0.2 10*3/uL (ref 0.0–0.5)
Eosinophils Relative: 3 %
HCT: 40.3 % (ref 36.0–46.0)
Hemoglobin: 13.6 g/dL (ref 12.0–15.0)
Immature Granulocytes: 1 %
Lymphocytes Relative: 30 %
Lymphs Abs: 2 10*3/uL (ref 0.7–4.0)
MCH: 29.1 pg (ref 26.0–34.0)
MCHC: 33.7 g/dL (ref 30.0–36.0)
MCV: 86.3 fL (ref 80.0–100.0)
Monocytes Absolute: 0.3 10*3/uL (ref 0.1–1.0)
Monocytes Relative: 4 %
Neutro Abs: 4 10*3/uL (ref 1.7–7.7)
Neutrophils Relative %: 61 %
Platelets: 344 10*3/uL (ref 150–400)
RBC: 4.67 MIL/uL (ref 3.87–5.11)
RDW: 14.9 % (ref 11.5–15.5)
WBC: 6.5 10*3/uL (ref 4.0–10.5)
nRBC: 0 % (ref 0.0–0.2)

## 2021-09-02 LAB — RAPID URINE DRUG SCREEN, HOSP PERFORMED
Amphetamines: NOT DETECTED
Barbiturates: NOT DETECTED
Benzodiazepines: NOT DETECTED
Cocaine: NOT DETECTED
Opiates: NOT DETECTED
Tetrahydrocannabinol: POSITIVE — AB

## 2021-09-02 LAB — COMPREHENSIVE METABOLIC PANEL
ALT: 43 U/L (ref 0–44)
AST: 36 U/L (ref 15–41)
Albumin: 3.5 g/dL (ref 3.5–5.0)
Alkaline Phosphatase: 78 U/L (ref 38–126)
Anion gap: 7 (ref 5–15)
BUN: 11 mg/dL (ref 6–20)
CO2: 23 mmol/L (ref 22–32)
Calcium: 9 mg/dL (ref 8.9–10.3)
Chloride: 107 mmol/L (ref 98–111)
Creatinine, Ser: 0.7 mg/dL (ref 0.44–1.00)
GFR, Estimated: >60 mL/min (ref >60)
Glucose, Bld: 83 mg/dL (ref 70–99)
Potassium: 3.9 mmol/L (ref 3.5–5.1)
Sodium: 137 mmol/L (ref 135–145)
Total Bilirubin: 0.2 mg/dL — ABNORMAL LOW (ref 0.3–1.2)
Total Protein: 7 g/dL (ref 6.5–8.1)

## 2021-09-02 LAB — RESP PANEL BY RT-PCR (FLU A&B, COVID) ARPGX2
Influenza A by PCR: NEGATIVE
Influenza B by PCR: NEGATIVE
SARS Coronavirus 2 by RT PCR: NEGATIVE

## 2021-09-02 LAB — I-STAT BETA HCG BLOOD, ED (MC, WL, AP ONLY): I-stat hCG, quantitative: <5 m[IU]/mL (ref <5)

## 2021-09-02 LAB — ETHANOL: Alcohol, Ethyl (B): 241 mg/dL — ABNORMAL HIGH (ref <10)

## 2021-09-02 MED ORDER — AMOXICILLIN-POT CLAVULANATE 875-125 MG PO TABS
1.0000 | ORAL_TABLET | Freq: Two times a day (BID) | ORAL | Status: DC
  Administered 2021-09-02 – 2021-09-03 (×2): 1 via ORAL
  Filled 2021-09-02 (×2): qty 1

## 2021-09-02 MED ORDER — OXYCODONE-ACETAMINOPHEN 5-325 MG PO TABS
1.0000 | ORAL_TABLET | Freq: Once | ORAL | Status: AC
  Administered 2021-09-02: 1 via ORAL
  Filled 2021-09-02: qty 1

## 2021-09-02 MED ORDER — THIAMINE HCL 100 MG/ML IJ SOLN
100.0000 mg | Freq: Every day | INTRAMUSCULAR | Status: DC
  Filled 2021-09-02: qty 2

## 2021-09-02 MED ORDER — THIAMINE HCL 100 MG PO TABS
100.0000 mg | ORAL_TABLET | Freq: Every day | ORAL | Status: DC
  Administered 2021-09-02 – 2021-09-03 (×2): 100 mg via ORAL
  Filled 2021-09-02 (×2): qty 1

## 2021-09-02 MED ORDER — LORAZEPAM 2 MG/ML IJ SOLN: 0.0000 mg | Freq: Four times a day (QID) | INTRAMUSCULAR | Status: DC

## 2021-09-02 MED ORDER — AMOXICILLIN 500 MG PO CAPS: 1000.0000 mg | ORAL_CAPSULE | Freq: Once | ORAL | Status: DC

## 2021-09-02 MED ORDER — LORAZEPAM 1 MG PO TABS: 0.0000 mg | ORAL_TABLET | Freq: Two times a day (BID) | ORAL | Status: DC

## 2021-09-02 MED ORDER — LORAZEPAM 1 MG PO TABS
1.0000 mg | ORAL_TABLET | Freq: Once | ORAL | Status: AC
  Administered 2021-09-02: 1 mg via ORAL
  Filled 2021-09-02: qty 1

## 2021-09-02 MED ORDER — IBUPROFEN 400 MG PO TABS
600.0000 mg | ORAL_TABLET | Freq: Once | ORAL | Status: AC
  Administered 2021-09-02: 600 mg via ORAL
  Filled 2021-09-02: qty 1

## 2021-09-02 MED ORDER — LORAZEPAM 2 MG/ML IJ SOLN: 0.0000 mg | Freq: Two times a day (BID) | INTRAMUSCULAR | Status: DC

## 2021-09-02 MED ORDER — LORAZEPAM 1 MG PO TABS: 0.0000 mg | ORAL_TABLET | Freq: Four times a day (QID) | ORAL | Status: DC

## 2021-09-02 NOTE — ED Provider Triage Note (Signed)
Emergency Medicine Provider Triage Evaluation Note ? ?[name] , a 46 y.o. female  was evaluated in triage.  Pt complains of not feeling like herself.  Feels depressed and expresses interest in hurting herself and possibly others.  Has been drinking more, history of at least 30 years of EtOH use.  Also uses marijuana and tobacco.  Experienced recent deaths of family/relatives, feeling more depressed.  Wants help.  Denies A/V hallucinations.  Hx CHF and sickle cell trait.  No other medical complaints. ? ?Security reports witnessing the pt responding to internal stimuli after I examined the patient ? ?Review of Systems  ?Positive: As above ?Negative: As above ? ?Physical Exam  ?BP (!) 151/98   Pulse 84   Temp 98.6 ?F (37 ?C) (Oral)   Resp 18   SpO2 98%  ?Gen:   Awake, no distress, alternating between flat and tearful affect ?Resp:  Normal effort, CTAB ?MSK:   Moves extremities without difficulty  ?Other:  RRR w/o M/R/G ? ?Medical Decision Making  ?Medically screening exam initiated at 12:26 PM.  Appropriate orders placed.  [name] was informed that the remainder of the evaluation will be completed by another provider, this initial triage assessment does not replace that evaluation, and the importance of remaining in the ED until their evaluation is complete. ? ?Medical clearance orderset ?  ?Cecil Cobbs, PA-C ?09/02/21 1243 ? ?

## 2021-09-02 NOTE — ED Notes (Signed)
Patient's belongings are in locker #3. ?

## 2021-09-02 NOTE — ED Notes (Signed)
All belongings labeled and placed in locker 3 ?

## 2021-09-02 NOTE — ED Notes (Signed)
Sort called for vitals and no response x2  ?

## 2021-09-02 NOTE — ED Triage Notes (Signed)
Patient states she drinks and has medicine in bag for tooth ache. She considers herself an alcoholic. She is depressed now and seeks to hurt herself and others. She has drank for 30 years.  ?

## 2021-09-02 NOTE — ED Provider Notes (Signed)
Abx and ibuporofen ordered for patient's tooth pain ?  ?Saddie Benders, PA-C ?09/02/21 1742 ? ?  ?Milagros Loll, MD ?09/03/21 [phone] ? ?

## 2021-09-02 NOTE — ED Provider Notes (Signed)
[facility] EMERGENCY DEPARTMENT Provider Note   CSN: [id number] Arrival date & time: 09/02/21  1149     History  Chief Complaint  Patient presents with   Medical Clearance    [name] is a 46 y.o. female.  Presenting to the emergency room with concern for multiple different issues.  States her primary concern is depression.  Has been very sad since she lost a loved one a couple months ago.  Had endorsed suicidal thoughts to prior provider but denies ongoing suicidal thoughts right now.  No plan.  No prior attempts at self-harm.  She does endorse history of alcohol abuse, drinking 1 pint of alcohol daily.  He has gone through some withdrawal symptoms before but denies prior history of delirium tremens.,  Seizures.  States that she is having tooth pain, and has seen the dentist and was started on antibiotics though she does not know what antibiotics she is supposed to be taking right now.  She has plans to follow-up with his dentist and have a 3 teeth removed on the right side in a couple weeks.  HPI     Home Medications Prior to Admission medications   Medication Sig Start Date End Date Taking? Authorizing Provider  albuterol (VENTOLIN HFA) 108 (90 Base) MCG/ACT inhaler Inhale 2 puffs into the lungs every 4 (four) hours as needed for wheezing or shortness of breath (cough, shortness of breath or wheezing.). 01/19/20   Kallie Locks, FNP  budesonide-formoterol (SYMBICORT) 80-4.5 MCG/ACT inhaler Inhale 2 puffs into the lungs 2 (two) times daily. 01/19/20   Kallie Locks, FNP  buPROPion (WELLBUTRIN SR) 150 MG 12 hr tablet Take 1 tablet (150 mg total) by mouth 2 (two) times daily. 01/19/20   Kallie Locks, FNP  carvedilol (COREG) 25 MG tablet Take 1 tablet (25 mg total) by mouth 2 (two) times daily with a meal. 04/11/21 06/10/21  Passmore, Enid Derry I, NP  cyclobenzaprine (FLEXERIL) 10 MG tablet Take 1 tablet (10 mg total) by mouth 2 (two) times daily as  needed for muscle spasms. 01/19/20   Kallie Locks, FNP  diazepam (VALIUM) 2 MG tablet Take one pill one hour prior to [facility] and then repeat just prior to mri if needed 04/22/21   Cristie Hem, PA-C  DULoxetine (CYMBALTA) 60 MG capsule Take 1 capsule (60 mg total) by mouth daily. 04/11/21   Passmore, Enid Derry I, NP  famotidine (PEPCID) 20 MG tablet Take 1 tablet (20 mg total) by mouth 2 (two) times daily. 02/04/21 03/06/21  Petrucelli, Samantha R, PA-C  furosemide (LASIX) 20 MG tablet Take 1 tablet (20 mg total) by mouth daily. 04/11/21 05/11/21  Passmore, Enid Derry I, NP  gabapentin (NEURONTIN) 600 MG tablet Take 1 tablet (600 mg total) by mouth 2 (two) times daily. 04/11/21 06/10/21  Orion Crook I, NP  hydrALAZINE (APRESOLINE) 25 MG tablet Take 1 tablet (25 mg total) by mouth 3 (three) times daily. 02/04/21 03/06/21  Petrucelli, Pleas Koch, PA-C  hydrochlorothiazide (HYDRODIURIL) 25 MG tablet Take 1 tablet (25 mg total) by mouth daily. 04/11/21 06/10/21  Orion Crook I, NP  HYDROcodone-acetaminophen (NORCO) 7.5-325 MG tablet Take 1-2 tablets by mouth 3 (three) times daily as needed for moderate pain. 04/13/21   Tarry Kos, MD  ibuprofen (ADVIL) 800 MG tablet Take 1 tablet (800 mg total) by mouth every 8 (eight) hours as needed. 07/21/19   Kallie Locks, FNP  lidocaine (LIDODERM) 5 % Place 1 patch onto the  skin daily. Remove & Discard patch within 12 hours or as directed by MD 01/19/20   Henderly, Britni A, PA-C  methocarbamol (ROBAXIN) 500 MG tablet Take 1 tablet (500 mg total) by mouth 2 (two) times daily. 01/19/20   Henderly, Britni A, PA-C  nitroGLYCERIN (NITROSTAT) 0.3 MG SL tablet Place 1 tablet (0.3 mg total) under the tongue every 5 (five) minutes as needed for chest pain. 04/11/21   Passmore, Enid Derry I, NP  RESTASIS 0.05 % ophthalmic emulsion Place 1 drop into both eyes as directed. 11/13/18   Kallie Locks, FNP      Allergies    Patient has no known allergies.    Review of Systems    Review of Systems  Constitutional:  Negative for chills and fever.  HENT:  Positive for dental problem. Negative for ear pain and sore throat.   Eyes:  Negative for pain and visual disturbance.  Respiratory:  Negative for cough and shortness of breath.   Cardiovascular:  Negative for chest pain and palpitations.  Gastrointestinal:  Negative for abdominal pain and vomiting.  Genitourinary:  Negative for dysuria and hematuria.  Musculoskeletal:  Negative for arthralgias and back pain.  Skin:  Negative for color change and rash.  Neurological:  Negative for seizures and syncope.  All other systems reviewed and are negative.  Physical Exam Updated Vital Signs BP (!) 151/98    Pulse 84    Temp 98.6 F (37 C) (Oral)    Resp 18    SpO2 98%  Physical Exam Vitals and nursing note reviewed.  Constitutional:      General: She is not in acute distress.    Appearance: She is well-developed.  HENT:     Head: Normocephalic and atraumatic.     Mouth/Throat:     Comments: Extensive dental caries throughout her mouth including her right upper and right lower molar regions, no dental abscess noted are identified Eyes:     Conjunctiva/sclera: Conjunctivae normal.  Cardiovascular:     Rate and Rhythm: Normal rate and regular rhythm.     Heart sounds: No murmur heard. Pulmonary:     Effort: Pulmonary effort is normal. No respiratory distress.     Breath sounds: Normal breath sounds.  Abdominal:     Palpations: Abdomen is soft.     Tenderness: There is no abdominal tenderness.  Musculoskeletal:        General: No swelling.     Cervical back: Neck supple.  Skin:    General: Skin is warm and dry.     Capillary Refill: Capillary refill takes less than 2 seconds.  Neurological:     Mental Status: She is alert.  Psychiatric:        Mood and Affect: Mood normal.    ED Results / Procedures / Treatments   Labs (all labs ordered are listed, but only abnormal results are displayed) Labs Reviewed   COMPREHENSIVE METABOLIC PANEL - Abnormal; Notable for the following components:      Result Value   Total Bilirubin 0.2 (*)    All other components within normal limits  ETHANOL - Abnormal; Notable for the following components:   Alcohol, Ethyl (B) 241 (*)    All other components within normal limits  RAPID URINE DRUG SCREEN, HOSP PERFORMED - Abnormal; Notable for the following components:   Tetrahydrocannabinol POSITIVE (*)    All other components within normal limits  RESP PANEL BY RT-PCR (FLU A&B, COVID) ARPGX2  CBC WITH DIFFERENTIAL/PLATELET  I-STAT  BETA HCG BLOOD, ED ([facility], [facility], [facility] ONLY)    EKG None  Radiology No results found.  Procedures Procedures    Medications Ordered in ED Medications  LORazepam (ATIVAN) injection 0-4 mg (0 mg Intravenous Hold 09/02/21 2215)    Or  LORazepam (ATIVAN) tablet 0-4 mg ( Oral See Alternative 09/02/21 2215)  LORazepam (ATIVAN) injection 0-4 mg (has no administration in time range)    Or  LORazepam (ATIVAN) tablet 0-4 mg (has no administration in time range)  thiamine tablet 100 mg (100 mg Oral Given 09/02/21 2154)    Or  thiamine (B-1) injection 100 mg ( Intravenous See Alternative 09/02/21 2154)  amoxicillin-clavulanate (AUGMENTIN) 875-125 MG per tablet 1 tablet (1 tablet Oral Given 09/02/21 2153)  ibuprofen (ADVIL) tablet 600 mg (600 mg Oral Given 09/02/21 2154)  LORazepam (ATIVAN) tablet 1 mg (1 mg Oral Given 09/02/21 2154)  oxyCODONE-acetaminophen (PERCOCET/ROXICET) 5-325 MG per tablet 1 tablet (1 tablet Oral Given 09/02/21 2154)    ED Course/ Medical Decision Making/ A&P                           Medical Decision Making Risk OTC drugs. Prescription drug management.   46 year old lady with multiple concerns.  Regarding alcohol abuse, chronic alcoholic, drinking up to 1 pint daily.  Currently mildly anxious but her vital signs are grossly stable, no shaking on exam, will provide oral dose of Ativan, placed on CIWA protocol.  Regarding  tooth pain, followed by outpatient dentist who had prescribed an antibiotic though she is unsure what antibiotic she was taking.  Will order Augmentin while she is in ER waiting for psych eval.  Feel she is still appropriate for out pt management of her dentition.   Regarding her depression, patient is currently voluntary.  She does not endorse ongoing suicidal or homicidal ideation at this time however given the significance of her depression, tearfulness, will consult TTS to evaluate and discuss further options.  Place psych hold orders while waiting for TTS.        Final Clinical Impression(s) / ED Diagnoses Final diagnoses:  Depression, unspecified depression type  Alcohol abuse  Pain, dental    Rx / DC Orders ED Discharge Orders     None         Milagros Loll, MD 09/03/21 [phone]

## 2021-09-03 MED ORDER — DULOXETINE HCL 60 MG PO CPEP
60.0000 mg | ORAL_CAPSULE | Freq: Every day | ORAL | Status: DC
  Administered 2021-09-03: 60 mg via ORAL
  Filled 2021-09-03: qty 1

## 2021-09-03 MED ORDER — HYDRALAZINE HCL 25 MG PO TABS
25.0000 mg | ORAL_TABLET | Freq: Three times a day (TID) | ORAL | Status: DC
Start: 2021-09-03 — End: 2021-09-03
  Administered 2021-09-03: 25 mg via ORAL
  Filled 2021-09-03: qty 1

## 2021-09-03 MED ORDER — ACETAMINOPHEN 325 MG PO TABS
650.0000 mg | ORAL_TABLET | Freq: Once | ORAL | Status: AC
  Administered 2021-09-03: 650 mg via ORAL
  Filled 2021-09-03: qty 2

## 2021-09-03 MED ORDER — CARVEDILOL 12.5 MG PO TABS
25.0000 mg | ORAL_TABLET | Freq: Two times a day (BID) | ORAL | Status: DC
  Administered 2021-09-03: 25 mg via ORAL
  Filled 2021-09-03: qty 2

## 2021-09-03 MED ORDER — HYDROCHLOROTHIAZIDE 25 MG PO TABS
25.0000 mg | ORAL_TABLET | Freq: Every day | ORAL | Status: DC
  Administered 2021-09-03: 25 mg via ORAL
  Filled 2021-09-03: qty 1

## 2021-09-03 NOTE — ED Notes (Signed)
Safe transport called to pick up patient.  

## 2021-09-03 NOTE — ED Provider Notes (Signed)
Emergency Medicine Observation Re-evaluation Note ? ?[name] is a 46 y.o. female, seen on rounds today.  Pt initially presented to the ED for complaints of hx etoh use disorder and depression/SI. Pt currently alert, no distress.  ? ?Physical Exam  ?BP (!) 189/103 (BP Location: Right Arm)   Pulse 72   Temp 98.5 ?F (36.9 ?C)   Resp 20   SpO2 98%  ?Physical Exam ?General: alert, content.  ?Cardiac: regular rate ?Lungs: breathing comfortably ?Psych: depressed mood.  ? ?ED Course / MDM  ? ?I have reviewed the labs performed to date as well as medications administered while in observation.  Recent changes in the last 24 hours include ED obs, BP assessment.  ? ?Plan  ?Patient started back on her bp meds this AM. ? ?No headache. No cp or sob.  ? ?Pt has been accepted at [facility], Dr Loyola Mast. ? ?Pt currently appears stable for transport. ? ? ? ?  ?Cathren Laine, MD ?09/03/21 1002 ? ?

## 2021-09-03 NOTE — ED Notes (Signed)
TTS in process 

## 2021-09-03 NOTE — ED Notes (Signed)
Attempted to call report to [facility] [phone]) without success. Will attempt again.  ?

## 2021-09-03 NOTE — ED Notes (Signed)
Message sent to MD regarding BP and home meds needing to be ordered for HTN.  ?

## 2021-09-03 NOTE — Progress Notes (Signed)
Per Ysidro Evert Bobbitt,NP, patient meets criteria for inpatient treatment. There are no available or appropriate beds at [facility] today. CSW faxed referrals to the following facilities for review: ? ?[facility]  Pending - No Request Sent N/A [address], [place] [address] [phone] --  ?[facility]  Pending - No Request Sent N/A [address] [place] [address] [phone] --  ?[facility]  Pending - No Request Sent N/A [address] [place] [address] [phone] --  ?[facility]  Pending - No Request Sent N/A [address] [place] [address] [phone] --  ?[facility]  Pending - No Request Sent N/A [address] [place] [address] [phone] --  ?[facility]  Pending - No Request Sent N/A [address] [place], [place] [address] [phone] --  ?[facility]  Pending - No Request Sent N/A [address] [place] [address] [phone] --  ?[facility]  Pending - No Request Sent N/A [address] [place] [address] [phone] --  ?[facility]  Pending - No Request Sent N/A [address] [place] [address] [phone] --  ?[facility]  Pending - No Request Sent N/A [address] [place] [address] [phone] --  ?[facility]  Pending - No Request Sent N/A [address] [place] [address] [phone] --  ?[facility]  Pending - No Request Sent N/A [address] [place] [address] [phone] --  ?[facility]  Pending - No Request Sent N/A [name] [place] [address] [phone] --  ?[facility]   Pending - No Request Sent N/A [address], [place] [address] [phone] --  ?[facility]  Pending - No Request Sent N/A [address] [place] [address] [phone] --  ? ? ?TTS will continue to seek bed placement. ? ?Crissie Reese, MSW, LCSW-A, LCAS-A ?Phone: [phone] ?Disposition/TOC ? ?

## 2021-09-03 NOTE — Discharge Instructions (Signed)
Transport to [facility] ?

## 2021-09-03 NOTE — Progress Notes (Signed)
Per [facility],  pt has been accepted to [facility] main campus. Accepting provider is Dr. Estill Cotta. Patient can arrive anytime within the next 24 hours. Number for report is [phone] or [phone]. ? ? ?Crissie Reese, MSW, LCSW-A ?Phone: [phone] ?Disposition/TOC ? ? ?

## 2021-09-03 NOTE — Progress Notes (Signed)
Transport form provided completed to RN station and CSW will send copy to Transportation. ?

## 2021-09-03 NOTE — BH Assessment (Addendum)
Comprehensive Clinical Assessment (CCA) Note  09/03/2021 [name] [id number]  Disposition: Brittany Bering, NP, patient meets inpatient treatment. [name], [facility] at [facility] to review for placement on [facility] Unit. Bed placement to be determined. [name], RN, informed of disposition.  The patient demonstrates the following risk factors for suicide: Chronic risk factors for suicide include: psychiatric disorder of depression and substance use disorder. Acute risk factors for suicide include:  grief/loss . Protective factors for this patient include: responsibility to others (children, family), coping skills, and hope for the future. Considering these factors, the overall suicide risk at this point appears to be moderate. Patient is not appropriate for outpatient follow up.  Flowsheet Row ED from 09/02/2021 in [facility] EMERGENCY DEPARTMENT ED from 02/04/2021 in [facility] EMERGENCY DEPARTMENT  C-SSRS RISK CATEGORY High Risk No Risk      [name] is a 46 year old female presenting voluntary to [facility] due to alcohol intoxication and HI. Patient denied SI to TTS clinician, however in the ED patient reported SI to provider in the ED, according to patient chart. Patient denied HI with plan, stating I just get so angry that I want to harm someone, no identified persons. Patient reported onset of worsening depressive symptoms and HI after childhood friend died 3 months ago. Patient reported currently 1 pint of alcohol daily. Patient reported drinking for 30 years. Patient denied prior psych hospitalizations, suicidal attempts and self-harming behaviors.   Patient reported being prescribed medication for depression from PCP, patient unable to recall name of medication. Patient denied receiving outpatient mental health services.   Patient resides with mother, brother and 69 year old daughter. Patient currently is employed at [employer] and reports no work-related stressors. Patient  reported no access to guns. Patient unable to contract for safety. Patient was calm and cooperative during assessment.   BAL 241  Chief Complaint:  Chief Complaint  Patient presents with   Alcohol Problem   Homicidal   Visit Diagnosis:  Alcohol dependence Major depressive disorder   CCA Screening, Triage and Referral (STR)  Patient Reported Information How did you hear about [place]? Self  What Is the Reason for Your Visit/Call Today? Alcohol intoxication and HI  How Long Has This Been Causing You Problems? 1-6 months  What Do You Feel Would Help You the Most Today? Alcohol or Drug Use Treatment; Treatment for Depression or other mood problem   Have You Recently Had Any Thoughts About Hurting Yourself? No  Are You Planning to Commit Suicide/Harm Yourself At This time? No   Have you Recently Had Thoughts About Hurting Someone [name]? Yes  Are You Planning to Harm Someone at This Time? No  Explanation: No data recorded  Have You Used Any Alcohol or Drugs in the Past 24 Hours? Yes  How Long Ago Did You Use Drugs or Alcohol? No data recorded What Did You Use and How Much? alcohol, amounts unknown   Do You Currently Have a Therapist/Psychiatrist? No  Name of Therapist/Psychiatrist: No data recorded  Have You Been Recently Discharged From Any Office Practice or Programs? No  Explanation of Discharge From Practice/Program: No data recorded    CCA Screening Triage Referral Assessment Type of Contact: Tele-Assessment  Telemedicine Service Delivery:   Is this Initial or Reassessment? Initial Assessment  Date Telepsych consult ordered in [facility]:  09/02/21  Time Telepsych consult ordered in [facility]:  2037  Location of Assessment: [facility] ED  Provider Location: [facility] Assessment Services  Collateral Involvement: none reported   Does Patient Have a Stage manager Guardian? No data recorded Name and Contact of Legal Guardian: No data recorded If Minor and Not Living  with Parent(s), Who has Custody? No data recorded Is CPS involved or ever been involved? Never  Is APS involved or ever been involved? Never   Patient Determined To Be At Risk for Harm To Self or Others Based on Review of Patient Reported Information or Presenting Complaint? No data recorded Method: No data recorded Availability of Means: No data recorded Intent: No data recorded Notification Required: No data recorded Additional Information for Danger to Others Potential: No data recorded Additional Comments for Danger to Others Potential: No data recorded Are There Guns or Other Weapons in Your Home? No data recorded Types of Guns/Weapons: No data recorded Are These Weapons Safely Secured?                            No data recorded Who Could Verify You Are Able To Have These Secured: No data recorded Do You Have any Outstanding Charges, Pending Court Dates, Parole/Probation? No data recorded Contacted To Inform of Risk of Harm To Self or Others: No data recorded   Does Patient Present under Involuntary Commitment? No  IVC Papers Initial File Date: No data recorded  [place] of Residence: [place]   Patient Currently Receiving the Following Services: Not Receiving Services   Determination of Need: Emergent (2 hours)   Options For Referral: Outpatient Therapy; Inpatient Hospitalization; Medication Management     CCA Biopsychosocial Patient Reported Schizophrenia/Schizoaffective Diagnosis in Past: No data recorded  Strengths: No data recorded  Mental Health Symptoms Depression:   Hopelessness; Fatigue; Increase/decrease in appetite; Worthlessness; Sleep (too much or little); Change in energy/activity   Duration of Depressive symptoms:  Duration of Depressive Symptoms: Greater than two weeks   Mania:   None   Anxiety:    Worrying; Sleep; Restlessness; Tension   Psychosis:   None; Hallucinations   Duration of Psychotic symptoms:    Trauma:   None    Obsessions:   None   Compulsions:   None   Inattention:   None   Hyperactivity/Impulsivity:   None   Oppositional/Defiant Behaviors:   None   Emotional Irregularity:   None   Other Mood/Personality Symptoms:  No data recorded   Mental Status Exam Appearance and self-care  Stature:   Average   Weight:   Average weight   Clothing:   Casual   Grooming:   Normal   Cosmetic use:   None   Posture/gait:   Normal   Motor activity:   Not Remarkable   Sensorium  Attention:   Distractible   Concentration:  No data recorded  Orientation:   X5   Recall/memory:   Normal   Affect and Mood  Affect:   Depressed   Mood:   Depressed; Hopeless; Worthless   Relating  Eye contact:   Normal   Facial expression:   Depressed; Sad   Attitude toward examiner:   Cooperative   Thought and Language  Speech flow:  Normal   Thought content:   Appropriate to Mood and Circumstances   Preoccupation:   None   Hallucinations:   Auditory; Visual   Organization:  No data recorded  [employer] of Knowledge:   Average   Intelligence:   Average   Abstraction:   Normal   Judgement:   Normal  Reality Testing:   -- Pincus Badder)   Insight:   Lacking   Decision Making:   Impulsive   Social Functioning  Social Maturity:  No data recorded  Social Judgement:   Normal   Stress  Stressors:   Grief/losses   Coping Ability:   Exhausted; Overwhelmed   Skill Deficits:   Decision making; Self-control   Supports:   Family     Religion: Religion/Spirituality Are You A Religious Person?:  Special educational needs teacher)  Leisure/Recreation: Leisure / Recreation Do You Have Hobbies?: Yes Leisure and Hobbies: "coming home from work and drinking"  Exercise/Diet: Exercise/Diet Do You Exercise?:  (uta) Do You Follow a Special Diet?: No Do You Have Any Trouble Sleeping?: Yes Explanation of Sleeping Difficulties: 3   CCA  Employment/Education Employment/Work Situation: Employment / Work Situation Employment Situation: Employed Work Stressors: none reported Has Patient ever Been in Passenger transport manager?: No  Education: Education Is Patient Currently Attending School?: No Last Grade Completed: 70 Did [place]?: No Did You Have An Individualized Education Program (IIEP):  Pincus Badder) Did You Have Any Difficulty At [employer]?:  (uta) Patient's Education Has Been Impacted by Current Illness:  (uta)   CCA Family/Childhood History Family and Relationship History: Family history Does patient have children?: Yes How many children?: 3 How is patient's relationship with their children?: good  Childhood History:  Childhood History By whom was/is the patient raised?: Mother Did patient suffer any verbal/emotional/physical/sexual abuse as a child?: No Did patient suffer from severe childhood neglect?: No Has patient ever been sexually abused/assaulted/raped as an adolescent or adult?: No Was the patient ever a victim of a crime or a disaster?: No  Child/Adolescent Assessment:     CCA Substance Use Alcohol/Drug Use: Alcohol / Drug Use Pain Medications: see MAR Prescriptions: see MAR Over the Counter: see MAR History of alcohol / drug use?: Yes Substance #1 Name of Substance 1: alcohol 1 - Age of First Use: 15 1 - Amount (size/oz): currently 1 pint 1 - Frequency: daily 1 - Last Use / Amount: today                       ASAM's:  Six Dimensions of Multidimensional Assessment  Dimension 1:  Acute Intoxication and/or Withdrawal Potential:      Dimension 2:  Biomedical Conditions and Complications:      Dimension 3:  Emotional, Behavioral, or Cognitive Conditions and Complications:     Dimension 4:  Readiness to Change:     Dimension 5:  Relapse, Continued use, or Continued Problem Potential:     Dimension 6:  Recovery/Living Environment:     ASAM Severity Score:    ASAM Recommended Level  of Treatment:     Substance use Disorder (SUD) Substance Use Disorder (SUD)  Checklist Symptoms of Substance Use: Social, occupational, recreational activities given up or reduced due to use, Evidence of tolerance  Recommendations for Services/Supports/Treatments: Recommendations for Services/Supports/Treatments Recommendations For Services/Supports/Treatments: Facility Based Crisis, Individual Therapy, Inpatient Hospitalization, Medication Management  Discharge Disposition:    DSM5 Diagnoses: Patient Active Problem List   Diagnosis Date Noted   Acute bilateral low back pain without sciatica 01/22/2020   Bilateral leg cramps 12/19/2018   Vitamin D deficiency 12/19/2018   Chest discomfort 11/14/2018   Wrist strain, right, initial encounter 11/14/2018   Common migraine with intractable migraine 01/25/2017   Hypertension 12/27/2016   Chronic combined systolic and diastolic congestive heart failure ([place]) 11/09/2016   Chest pain 11/08/2016   Hypertensive urgency  11/08/2016   Admission for sterilization 08/17/2012   Spontaneous vaginal delivery 08/16/2012   Consultation for sterilization 07/25/2012   Poor fetal growth, affecting management of mother, antepartum condition or complication [id number]   Chronic hypertension complicating or reason for care during pregnancy 04/30/2012   Supervision of high-risk pregnancy 04/30/2012   Sickle cell trait ([place]) 04/30/2012     Referrals to Alternative Service(s): Referred to Alternative Service(s):   Place:   Date:   Time:    Referred to Alternative Service(s):   Place:   Date:   Time:    Referred to Alternative Service(s):   Place:   Date:   Time:    Referred to Alternative Service(s):   Place:   Date:   Time:     Venora Maples, [facility]

## 2021-10-24 ENCOUNTER — Ambulatory Visit (HOSPITAL_COMMUNITY): Payer: Medicaid Other | Admitting: Student in an Organized Health Care Education/Training Program

## 2021-12-19 ENCOUNTER — Encounter: Payer: Self-pay | Admitting: Nurse Practitioner

## 2021-12-19 ENCOUNTER — Ambulatory Visit (INDEPENDENT_AMBULATORY_CARE_PROVIDER_SITE_OTHER): Payer: Medicaid Other | Admitting: Nurse Practitioner

## 2021-12-19 VITALS — BP 180/96 | HR 64 | Temp 98.2°F | Ht 64.0 in | Wt 115.0 lb

## 2021-12-19 DIAGNOSIS — I10 Essential (primary) hypertension: Secondary | ICD-10-CM | POA: Diagnosis not present

## 2021-12-19 DIAGNOSIS — I509 Heart failure, unspecified: Secondary | ICD-10-CM | POA: Diagnosis not present

## 2021-12-19 DIAGNOSIS — M25551 Pain in right hip: Secondary | ICD-10-CM

## 2021-12-19 DIAGNOSIS — Z1231 Encounter for screening mammogram for malignant neoplasm of breast: Secondary | ICD-10-CM

## 2021-12-19 DIAGNOSIS — R2231 Localized swelling, mass and lump, right upper limb: Secondary | ICD-10-CM

## 2021-12-19 DIAGNOSIS — M25552 Pain in left hip: Secondary | ICD-10-CM

## 2021-12-19 DIAGNOSIS — F3289 Other specified depressive episodes: Secondary | ICD-10-CM | POA: Diagnosis not present

## 2021-12-19 DIAGNOSIS — K219 Gastro-esophageal reflux disease without esophagitis: Secondary | ICD-10-CM

## 2021-12-19 DIAGNOSIS — R2232 Localized swelling, mass and lump, left upper limb: Secondary | ICD-10-CM

## 2021-12-19 DIAGNOSIS — J4520 Mild intermittent asthma, uncomplicated: Secondary | ICD-10-CM

## 2021-12-19 MED ORDER — CLONIDINE HCL 0.1 MG PO TABS
0.1000 mg | ORAL_TABLET | Freq: Once | ORAL | Status: AC
  Administered 2021-12-19: 0.1 mg via ORAL

## 2021-12-19 MED ORDER — DULOXETINE HCL 60 MG PO CPEP: 60.0000 mg | ORAL_CAPSULE | Freq: Every day | ORAL | 1 refills | Status: DC

## 2021-12-19 MED ORDER — BUPROPION HCL ER (SR) 150 MG PO TB12: 150.0000 mg | ORAL_TABLET | Freq: Two times a day (BID) | ORAL | 3 refills | Status: AC

## 2021-12-19 MED ORDER — CYCLOBENZAPRINE HCL 10 MG PO TABS
10.0000 mg | ORAL_TABLET | Freq: Two times a day (BID) | ORAL | 3 refills | Status: DC | PRN
Start: 2021-12-19 — End: 2022-07-05

## 2021-12-19 MED ORDER — METHOCARBAMOL 500 MG PO TABS: 500.0000 mg | ORAL_TABLET | Freq: Two times a day (BID) | ORAL | 0 refills | Status: DC

## 2021-12-19 MED ORDER — BUDESONIDE-FORMOTEROL FUMARATE 80-4.5 MCG/ACT IN AERO: 2.0000 | INHALATION_SPRAY | Freq: Two times a day (BID) | RESPIRATORY_TRACT | 11 refills | Status: DC

## 2021-12-19 MED ORDER — FAMOTIDINE 20 MG PO TABS: 20.0000 mg | ORAL_TABLET | Freq: Two times a day (BID) | ORAL | 0 refills | Status: DC

## 2021-12-19 MED ORDER — FUROSEMIDE 20 MG PO TABS: 20.0000 mg | ORAL_TABLET | Freq: Every day | ORAL | 0 refills | Status: DC

## 2021-12-19 MED ORDER — ALBUTEROL SULFATE HFA 108 (90 BASE) MCG/ACT IN AERS: 2.0000 | INHALATION_SPRAY | RESPIRATORY_TRACT | 11 refills | Status: DC | PRN

## 2021-12-19 MED ORDER — HYDROCHLOROTHIAZIDE 25 MG PO TABS: 25.0000 mg | ORAL_TABLET | Freq: Every day | ORAL | 1 refills | Status: DC

## 2021-12-19 MED ORDER — HYDRALAZINE HCL 25 MG PO TABS: 25.0000 mg | ORAL_TABLET | Freq: Three times a day (TID) | ORAL | 0 refills | Status: DC

## 2021-12-19 MED ORDER — CARVEDILOL 25 MG PO TABS
25.0000 mg | ORAL_TABLET | Freq: Two times a day (BID) | ORAL | 1 refills | Status: DC
Start: 2021-12-19 — End: 2022-10-04

## 2021-12-19 MED ORDER — GABAPENTIN 600 MG PO TABS: 600.0000 mg | ORAL_TABLET | Freq: Two times a day (BID) | ORAL | 1 refills | Status: DC

## 2021-12-19 NOTE — Patient Instructions (Addendum)
1. Primary hypertension  - cloNIDine (CATAPRES) tablet 0.1 mg - budesonide-formoterol (SYMBICORT) 80-4.5 MCG/ACT inhaler; Inhale 2 puffs into the lungs 2 (two) times daily.  Dispense: 1 each; Refill: 11 - carvedilol (COREG) 25 MG tablet; Take 1 tablet (25 mg total) by mouth 2 (two) times daily with a meal.  Dispense: 60 tablet; Refill: 1 - CBC - Comprehensive metabolic panel  Low salt diet  Recheck BP this afternoon after taking BP meds - if still elevated go to the ED  2. Chronic congestive heart failure, unspecified heart failure type (HCC)  - furosemide (LASIX) 20 MG tablet; Take 1 tablet (20 mg total) by mouth daily.  Dispense: 30 tablet; Refill: 0 - carvedilol (COREG) 25 MG tablet; Take 1 tablet (25 mg total) by mouth 2 (two) times daily with a meal.  Dispense: 60 tablet; Refill: 1  3. Essential hypertension  - hydrochlorothiazide (HYDRODIURIL) 25 MG tablet; Take 1 tablet (25 mg total) by mouth daily.  Dispense: 30 tablet; Refill: 1  4. Other depression  - buPROPion (WELLBUTRIN SR) 150 MG 12 hr tablet; Take 1 tablet (150 mg total) by mouth 2 (two) times daily.  Dispense: 180 tablet; Refill: 3 - DULoxetine (CYMBALTA) 60 MG capsule; Take 1 capsule (60 mg total) by mouth daily.  Dispense: 30 capsule; Refill: 1  5. Hip pain  - gabapentin (NEURONTIN) 600 MG tablet; Take 1 tablet (600 mg total) by mouth 2 (two) times daily.  Dispense: 60 tablet; Refill: 1  6. Gastroesophageal reflux disease without esophagitis  - famotidine (PEPCID) 20 MG tablet; Take 1 tablet (20 mg total) by mouth 2 (two) times daily.  Dispense: 60 tablet; Refill: 0  7. Mild intermittent asthma without complication  - budesonide-formoterol (SYMBICORT) 80-4.5 MCG/ACT inhaler; Inhale 2 puffs into the lungs 2 (two) times daily.  Dispense: 1 each; Refill: 11  8. Lump of axilla, right  - [place] AXILLA RIGHT; Future - [place] AXILLA LEFT; Future - MM Digital Screening; Future  9. Lump of axilla, left  - [place] AXILLA  RIGHT; Future - [place] AXILLA LEFT; Future - MM Digital Screening; Future  10. Encounter for screening mammogram for malignant neoplasm of breast  - [place] AXILLA RIGHT; Future - [place] AXILLA LEFT; Future - MM Digital Screening; Future    Follow up:  Follow up in 3 months BP - 1 week  - nurse recheck BP

## 2021-12-19 NOTE — Assessment & Plan Note (Signed)
-   cloNIDine (CATAPRES) tablet 0.1 mg - budesonide-formoterol (SYMBICORT) 80-4.5 MCG/ACT inhaler; Inhale 2 puffs into the lungs 2 (two) times daily.  Dispense: 1 each; Refill: 11 - carvedilol (COREG) 25 MG tablet; Take 1 tablet (25 mg total) by mouth 2 (two) times daily with a meal.  Dispense: 60 tablet; Refill: 1 - CBC - Comprehensive metabolic panel  2. Chronic congestive heart failure, unspecified heart failure type (HCC)  - furosemide (LASIX) 20 MG tablet; Take 1 tablet (20 mg total) by mouth daily.  Dispense: 30 tablet; Refill: 0 - carvedilol (COREG) 25 MG tablet; Take 1 tablet (25 mg total) by mouth 2 (two) times daily with a meal.  Dispense: 60 tablet; Refill: 1  3. Essential hypertension  - hydrochlorothiazide (HYDRODIURIL) 25 MG tablet; Take 1 tablet (25 mg total) by mouth daily.  Dispense: 30 tablet; Refill: 1  4. Other depression  - buPROPion (WELLBUTRIN SR) 150 MG 12 hr tablet; Take 1 tablet (150 mg total) by mouth 2 (two) times daily.  Dispense: 180 tablet; Refill: 3 - DULoxetine (CYMBALTA) 60 MG capsule; Take 1 capsule (60 mg total) by mouth daily.  Dispense: 30 capsule; Refill: 1  5. Hip pain  - gabapentin (NEURONTIN) 600 MG tablet; Take 1 tablet (600 mg total) by mouth 2 (two) times daily.  Dispense: 60 tablet; Refill: 1  6. Gastroesophageal reflux disease without esophagitis  - famotidine (PEPCID) 20 MG tablet; Take 1 tablet (20 mg total) by mouth 2 (two) times daily.  Dispense: 60 tablet; Refill: 0  7. Mild intermittent asthma without complication  - budesonide-formoterol (SYMBICORT) 80-4.5 MCG/ACT inhaler; Inhale 2 puffs into the lungs 2 (two) times daily.  Dispense: 1 each; Refill: 11  8. Lump of axilla, right  - [place] AXILLA RIGHT; Future - [place] AXILLA LEFT; Future - MM Digital Screening; Future  9. Lump of axilla, left  - [place] AXILLA RIGHT; Future - [place] AXILLA LEFT; Future - MM Digital Screening; Future  10. Encounter for screening mammogram for malignant  neoplasm of breast  - [place] AXILLA RIGHT; Future - [place] AXILLA LEFT; Future - MM Digital Screening; Future  Follow up:  Follow up:  Follow up in 3 months

## 2021-12-19 NOTE — Progress Notes (Signed)
@Patient  ID: , female    DOB: February 10, 1976, 46 y.o.   MRN: [id number]  Chief Complaint  Patient presents with   Follow-up    Pt is here for BP follow up visit. Pt stated she has 2 knots under both arms its been 15 year's with this issue. Pt is requesting refill on all medications     Referring provider: [id number] I, NP   HPI  Patient presents today for follow-up visit. Blood pressure is elevated in office. Clonidine given in office.  Patient states that she has been out of BP medications for the past few days. Needs refills on all medications.  Patient states that she has multiple lumps to bilateral axilla. This has been an issue for a while now. No drainage noted. Patient has never had a mammogram. Will order bilateral axilla Orion Crook and mammogram.   Denies f/c/s, n/v/d, hemoptysis, PND, leg swelling Denies chest pain or edema   Note: Patient was administered 2 clonidine tablets in office..blood pressure remains elevated. Patient instructed to pick up blood pressure medications from the pharmacy and take this morning. Patient is asymptomatic. If BP is still elevated this afternoon please go to the ED.   No Known Allergies  Immunization History  Administered Date(s) Administered   Influenza Split 05/23/2012   Influenza,inj,Quad PF,6+ Mos 02/20/2017, 04/08/2018   Pneumococcal Polysaccharide-23 08/18/2012, 11/09/2016   Tdap 06/13/2012    Past Medical History:  Diagnosis Date   Anemia    CHF (congestive heart failure) (HCC)    Common migraine with intractable migraine 01/25/2017   GERD (gastroesophageal reflux disease)    Hypertension    Miscarriage    x3   Sickle cell trait (HCC)    Vitamin D deficiency 07/2019    Tobacco History: Social History   Tobacco Use  Smoking Status Every Day   Packs/day: 0.30   Years: 10.00   Total pack years: 3.00   Types: Cigarettes  Smokeless Tobacco Never   Ready to quit: Not Answered Counseling given: Not  Answered   Outpatient Encounter Medications as of 12/19/2021  Medication Sig   diazepam (VALIUM) 2 MG tablet Take one pill one hour prior to mri and then repeat just prior to mri if needed   HYDROcodone-acetaminophen (NORCO) 7.5-325 MG tablet Take 1-2 tablets by mouth 3 (three) times daily as needed for moderate pain.   ibuprofen (ADVIL) 800 MG tablet Take 1 tablet (800 mg total) by mouth every 8 (eight) hours as needed.   nitroGLYCERIN (NITROSTAT) 0.3 MG SL tablet Place 1 tablet (0.3 mg total) under the tongue every 5 (five) minutes as needed for chest pain.   RESTASIS 0.05 % ophthalmic emulsion Place 1 drop into both eyes as directed.   [DISCONTINUED] albuterol (VENTOLIN HFA) 108 (90 Base) MCG/ACT inhaler Inhale 2 puffs into the lungs every 4 (four) hours as needed for wheezing or shortness of breath (cough, shortness of breath or wheezing.).   [DISCONTINUED] budesonide-formoterol (SYMBICORT) 80-4.5 MCG/ACT inhaler Inhale 2 puffs into the lungs 2 (two) times daily.   [DISCONTINUED] buPROPion (WELLBUTRIN SR) 150 MG 12 hr tablet Take 1 tablet (150 mg total) by mouth 2 (two) times daily.   [DISCONTINUED] cyclobenzaprine (FLEXERIL) 10 MG tablet Take 1 tablet (10 mg total) by mouth 2 (two) times daily as needed for muscle spasms.   [DISCONTINUED] DULoxetine (CYMBALTA) 60 MG capsule Take 1 capsule (60 mg total) by mouth daily.   [DISCONTINUED] methocarbamol (ROBAXIN) 500 MG tablet Take 1 tablet (500 mg total)  by mouth 2 (two) times daily.   albuterol (VENTOLIN HFA) 108 (90 Base) MCG/ACT inhaler Inhale 2 puffs into the lungs every 4 (four) hours as needed for wheezing or shortness of breath (cough, shortness of breath or wheezing.).   budesonide-formoterol (SYMBICORT) 80-4.5 MCG/ACT inhaler Inhale 2 puffs into the lungs 2 (two) times daily.   buPROPion (WELLBUTRIN SR) 150 MG 12 hr tablet Take 1 tablet (150 mg total) by mouth 2 (two) times daily.   carvedilol (COREG) 25 MG tablet Take 1 tablet (25 mg  total) by mouth 2 (two) times daily with a meal.   cyclobenzaprine (FLEXERIL) 10 MG tablet Take 1 tablet (10 mg total) by mouth 2 (two) times daily as needed for muscle spasms.   DULoxetine (CYMBALTA) 60 MG capsule Take 1 capsule (60 mg total) by mouth daily.   famotidine (PEPCID) 20 MG tablet Take 1 tablet (20 mg total) by mouth 2 (two) times daily.   furosemide (LASIX) 20 MG tablet Take 1 tablet (20 mg total) by mouth daily.   gabapentin (NEURONTIN) 600 MG tablet Take 1 tablet (600 mg total) by mouth 2 (two) times daily.   hydrALAZINE (APRESOLINE) 25 MG tablet Take 1 tablet (25 mg total) by mouth 3 (three) times daily.   hydrochlorothiazide (HYDRODIURIL) 25 MG tablet Take 1 tablet (25 mg total) by mouth daily.   lidocaine (LIDODERM) 5 % Place 1 patch onto the skin daily. Remove & Discard patch within 12 hours or as directed by MD (Patient not taking: Reported on 12/19/2021)   methocarbamol (ROBAXIN) 500 MG tablet Take 1 tablet (500 mg total) by mouth 2 (two) times daily.   [DISCONTINUED] carvedilol (COREG) 25 MG tablet Take 1 tablet (25 mg total) by mouth 2 (two) times daily with a meal.   [DISCONTINUED] famotidine (PEPCID) 20 MG tablet Take 1 tablet (20 mg total) by mouth 2 (two) times daily.   [DISCONTINUED] furosemide (LASIX) 20 MG tablet Take 1 tablet (20 mg total) by mouth daily.   [DISCONTINUED] gabapentin (NEURONTIN) 600 MG tablet Take 1 tablet (600 mg total) by mouth 2 (two) times daily.   [DISCONTINUED] hydrALAZINE (APRESOLINE) 25 MG tablet Take 1 tablet (25 mg total) by mouth 3 (three) times daily.   [DISCONTINUED] hydrochlorothiazide (HYDRODIURIL) 25 MG tablet Take 1 tablet (25 mg total) by mouth daily.   [EXPIRED] cloNIDine (CATAPRES) tablet 0.1 mg    No facility-administered encounter medications on file as of 12/19/2021.     Review of Systems  Review of Systems  Constitutional: Negative.   HENT: Negative.    Cardiovascular: Negative.   Gastrointestinal: Negative.   Skin:         Multiple lumps to bilateral axilla  Allergic/Immunologic: Negative.   Neurological: Negative.   Psychiatric/Behavioral: Negative.         Physical Exam  BP (!) 193/117 (BP Location: Left Arm, Patient Position: Sitting, Cuff Size: Small)   Pulse 82   Temp 98.2 F (36.8 C)   Ht 5\' 4"  (1.626 m)   Wt 115 lb (52.2 kg)   SpO2 100%   BMI 19.74 kg/m   Wt Readings from Last 5 Encounters:  12/19/21 115 lb (52.2 kg)  04/11/21 111 lb 0.6 oz (50.4 kg)  04/12/20 118 lb (53.5 kg)  01/19/20 120 lb (54.4 kg)  01/19/20 116 lb 12.8 oz (53 kg)     Physical Exam Vitals and nursing note reviewed.  Constitutional:      General: She is not in acute distress.  Appearance: She is well-developed.  Cardiovascular:     Rate and Rhythm: Normal rate and regular rhythm.  Pulmonary:     Effort: Pulmonary effort is normal.     Breath sounds: Normal breath sounds.  Chest:       Comments: Multiple lumps to bilateral axilla noted.  Neurological:     Mental Status: She is alert and oriented to person, place, and time.      Lab Results:  CBC    Component Value Date/Time   WBC 6.5 09/02/2021 1240   RBC 4.67 09/02/2021 1240   HGB 13.6 09/02/2021 1240   HGB 12.5 04/11/2021 1217   HCT 40.3 09/02/2021 1240   HCT 39.4 04/11/2021 1217   PLT 344 09/02/2021 1240   PLT 338 04/11/2021 1217   MCV 86.3 09/02/2021 1240   MCV 90 04/11/2021 1217   MCH 29.1 09/02/2021 1240   MCHC 33.7 09/02/2021 1240   RDW 14.9 09/02/2021 1240   RDW 13.8 04/11/2021 1217   LYMPHSABS 2.0 09/02/2021 1240   LYMPHSABS 1.6 04/11/2021 1217   MONOABS 0.3 09/02/2021 1240   EOSABS 0.2 09/02/2021 1240   EOSABS 0.2 04/11/2021 1217   BASOSABS 0.1 09/02/2021 1240   BASOSABS 0.1 04/11/2021 1217    BMET    Component Value Date/Time   NA 137 09/02/2021 1240   NA 145 (H) 04/11/2021 1217   K 3.9 09/02/2021 1240   CL 107 09/02/2021 1240   CO2 23 09/02/2021 1240   GLUCOSE 83 09/02/2021 1240   BUN 11 09/02/2021 1240    BUN 6 04/11/2021 1217   CREATININE 0.70 09/02/2021 1240   CREATININE 0.81 02/20/2017 1343   CALCIUM 9.0 09/02/2021 1240   GFRNONAA >60 09/02/2021 1240   GFRNONAA >89 11/14/2016 1513   GFRAA 110 01/19/2020 1132   GFRAA >89 11/14/2016 1513    BNP    Component Value Date/Time   BNP 50.4 02/04/2021 1247   BNP 11.4 02/20/2017 1343    ProBNP    Component Value Date/Time   PROBNP 23 10/24/2019 1035    Imaging: No results found.   Assessment & Plan:   Hypertension - cloNIDine (CATAPRES) tablet 0.1 mg - budesonide-formoterol (SYMBICORT) 80-4.5 MCG/ACT inhaler; Inhale 2 puffs into the lungs 2 (two) times daily.  Dispense: 1 each; Refill: 11 - carvedilol (COREG) 25 MG tablet; Take 1 tablet (25 mg total) by mouth 2 (two) times daily with a meal.  Dispense: 60 tablet; Refill: 1 - CBC - Comprehensive metabolic panel  2. Chronic congestive heart failure, unspecified heart failure type (HCC)  - furosemide (LASIX) 20 MG tablet; Take 1 tablet (20 mg total) by mouth daily.  Dispense: 30 tablet; Refill: 0 - carvedilol (COREG) 25 MG tablet; Take 1 tablet (25 mg total) by mouth 2 (two) times daily with a meal.  Dispense: 60 tablet; Refill: 1  3. Essential hypertension  - hydrochlorothiazide (HYDRODIURIL) 25 MG tablet; Take 1 tablet (25 mg total) by mouth daily.  Dispense: 30 tablet; Refill: 1  4. Other depression  - buPROPion (WELLBUTRIN SR) 150 MG 12 hr tablet; Take 1 tablet (150 mg total) by mouth 2 (two) times daily.  Dispense: 180 tablet; Refill: 3 - DULoxetine (CYMBALTA) 60 MG capsule; Take 1 capsule (60 mg total) by mouth daily.  Dispense: 30 capsule; Refill: 1  5. Hip pain  - gabapentin (NEURONTIN) 600 MG tablet; Take 1 tablet (600 mg total) by mouth 2 (two) times daily.  Dispense: 60 tablet; Refill: 1  6. Gastroesophageal reflux  disease without esophagitis  - famotidine (PEPCID) 20 MG tablet; Take 1 tablet (20 mg total) by mouth 2 (two) times daily.  Dispense: 60 tablet;  Refill: 0  7. Mild intermittent asthma without complication  - budesonide-formoterol (SYMBICORT) 80-4.5 MCG/ACT inhaler; Inhale 2 puffs into the lungs 2 (two) times daily.  Dispense: 1 each; Refill: 11  8. Lump of axilla, right  - [place] AXILLA RIGHT; Future - [place] AXILLA LEFT; Future - MM Digital Screening; Future  9. Lump of axilla, left  - [place] AXILLA RIGHT; Future - [place] AXILLA LEFT; Future - MM Digital Screening; Future  10. Encounter for screening mammogram for malignant neoplasm of breast  - [place] AXILLA RIGHT; Future - [place] AXILLA LEFT; Future - MM Digital Screening; Future  Follow up:  Follow up:  Follow up in 3 months   Patient Instructions  1. Primary hypertension  - cloNIDine (CATAPRES) tablet 0.1 mg - budesonide-formoterol (SYMBICORT) 80-4.5 MCG/ACT inhaler; Inhale 2 puffs into the lungs 2 (two) times daily.  Dispense: 1 each; Refill: 11 - carvedilol (COREG) 25 MG tablet; Take 1 tablet (25 mg total) by mouth 2 (two) times daily with a meal.  Dispense: 60 tablet; Refill: 1 - CBC - Comprehensive metabolic panel  Low salt diet  Recheck BP this afternoon after taking BP meds - if still elevated go to the ED  2. Chronic congestive heart failure, unspecified heart failure type (HCC)  - furosemide (LASIX) 20 MG tablet; Take 1 tablet (20 mg total) by mouth daily.  Dispense: 30 tablet; Refill: 0 - carvedilol (COREG) 25 MG tablet; Take 1 tablet (25 mg total) by mouth 2 (two) times daily with a meal.  Dispense: 60 tablet; Refill: 1  3. Essential hypertension  - hydrochlorothiazide (HYDRODIURIL) 25 MG tablet; Take 1 tablet (25 mg total) by mouth daily.  Dispense: 30 tablet; Refill: 1  4. Other depression  - buPROPion (WELLBUTRIN SR) 150 MG 12 hr tablet; Take 1 tablet (150 mg total) by mouth 2 (two) times daily.  Dispense: 180 tablet; Refill: 3 - DULoxetine (CYMBALTA) 60 MG capsule; Take 1 capsule (60 mg total) by mouth daily.  Dispense: 30 capsule; Refill: 1  5. Hip  pain  - gabapentin (NEURONTIN) 600 MG tablet; Take 1 tablet (600 mg total) by mouth 2 (two) times daily.  Dispense: 60 tablet; Refill: 1  6. Gastroesophageal reflux disease without esophagitis  - famotidine (PEPCID) 20 MG tablet; Take 1 tablet (20 mg total) by mouth 2 (two) times daily.  Dispense: 60 tablet; Refill: 0  7. Mild intermittent asthma without complication  - budesonide-formoterol (SYMBICORT) 80-4.5 MCG/ACT inhaler; Inhale 2 puffs into the lungs 2 (two) times daily.  Dispense: 1 each; Refill: 11  8. Lump of axilla, right  - [place] AXILLA RIGHT; Future - [place] AXILLA LEFT; Future - MM Digital Screening; Future  9. Lump of axilla, left  - [place] AXILLA RIGHT; Future - [place] AXILLA LEFT; Future - MM Digital Screening; Future  10. Encounter for screening mammogram for malignant neoplasm of breast  - [place] AXILLA RIGHT; Future - [place] AXILLA LEFT; Future - MM Digital Screening; Future    Follow up:  Follow up in 3 months BP - 1 week  - nurse recheck BP     Ivonne Andrew, NP 12/19/2021

## 2021-12-20 LAB — CBC
Hematocrit: 37.1 % (ref 34.0–46.6)
Hemoglobin: 12 g/dL (ref 11.1–15.9)
MCH: 28.4 pg (ref 26.6–33.0)
MCHC: 32.3 g/dL (ref 31.5–35.7)
MCV: 88 fL (ref 79–97)
Platelets: 298 10*3/uL (ref 150–450)
RBC: 4.22 x10E6/uL (ref 3.77–5.28)
RDW: 15 % (ref 11.7–15.4)
WBC: 5.8 10*3/uL (ref 3.4–10.8)

## 2021-12-20 LAB — COMPREHENSIVE METABOLIC PANEL WITH GFR
ALT: 29 IU/L (ref 0–32)
AST: 25 IU/L (ref 0–40)
Albumin/Globulin Ratio: 1.6 (ref 1.2–2.2)
Albumin: 4.7 g/dL (ref 3.8–4.8)
Alkaline Phosphatase: 82 IU/L (ref 44–121)
BUN/Creatinine Ratio: 14 (ref 9–23)
BUN: 11 mg/dL (ref 6–24)
Bilirubin Total: 0.5 mg/dL (ref 0.0–1.2)
CO2: 21 mmol/L (ref 20–29)
Calcium: 9.8 mg/dL (ref 8.7–10.2)
Chloride: 104 mmol/L (ref 96–106)
Creatinine, Ser: 0.76 mg/dL (ref 0.57–1.00)
Globulin, Total: 3 g/dL (ref 1.5–4.5)
Glucose: 85 mg/dL (ref 70–99)
Potassium: 4.7 mmol/L (ref 3.5–5.2)
Sodium: 141 mmol/L (ref 134–144)
Total Protein: 7.7 g/dL (ref 6.0–8.5)
eGFR: 100 mL/min/1.73

## 2021-12-26 ENCOUNTER — Ambulatory Visit: Payer: Self-pay

## 2021-12-28 ENCOUNTER — Emergency Department (HOSPITAL_COMMUNITY): Payer: Medicaid Other

## 2021-12-28 ENCOUNTER — Encounter (HOSPITAL_COMMUNITY): Payer: Self-pay

## 2021-12-28 ENCOUNTER — Other Ambulatory Visit: Payer: Self-pay

## 2021-12-28 ENCOUNTER — Emergency Department (HOSPITAL_COMMUNITY)
Admission: EM | Admit: 2021-12-28 | Discharge: 2021-12-28 | Disposition: A | Discharge status: Home / Self Care | Payer: Medicaid Other | Attending: Emergency Medicine | Admitting: Emergency Medicine

## 2021-12-28 DIAGNOSIS — R519 Headache, unspecified: Secondary | ICD-10-CM | POA: Diagnosis not present

## 2021-12-28 DIAGNOSIS — Z79899 Other long term (current) drug therapy: Secondary | ICD-10-CM | POA: Diagnosis not present

## 2021-12-28 DIAGNOSIS — R634 Abnormal weight loss: Secondary | ICD-10-CM | POA: Insufficient documentation

## 2021-12-28 DIAGNOSIS — R55 Syncope and collapse: Secondary | ICD-10-CM | POA: Diagnosis present

## 2021-12-28 DIAGNOSIS — I10 Essential (primary) hypertension: Secondary | ICD-10-CM | POA: Diagnosis not present

## 2021-12-28 DIAGNOSIS — W01198A Fall on same level from slipping, tripping and stumbling with subsequent striking against other object, initial encounter: Secondary | ICD-10-CM | POA: Diagnosis not present

## 2021-12-28 DIAGNOSIS — E876 Hypokalemia: Secondary | ICD-10-CM | POA: Insufficient documentation

## 2021-12-28 LAB — BASIC METABOLIC PANEL
Anion gap: 12 (ref 5–15)
BUN: 27 mg/dL — ABNORMAL HIGH (ref 6–20)
CO2: 30 mmol/L (ref 22–32)
Calcium: 9.4 mg/dL (ref 8.9–10.3)
Chloride: 96 mmol/L — ABNORMAL LOW (ref 98–111)
Creatinine, Ser: 1.05 mg/dL — ABNORMAL HIGH (ref 0.44–1.00)
GFR, Estimated: >60 mL/min (ref >60)
Glucose, Bld: 99 mg/dL (ref 70–99)
Potassium: 2.3 mmol/L — CL (ref 3.5–5.1)
Sodium: 138 mmol/L (ref 135–145)

## 2021-12-28 LAB — TROPONIN I (HIGH SENSITIVITY): Troponin I (High Sensitivity): 3 ng/L (ref <18)

## 2021-12-28 LAB — CBC
HCT: 40.5 % (ref 36.0–46.0)
Hemoglobin: 13.4 g/dL (ref 12.0–15.0)
MCH: 28.1 pg (ref 26.0–34.0)
MCHC: 33.1 g/dL (ref 30.0–36.0)
MCV: 84.9 fL (ref 80.0–100.0)
Platelets: 301 10*3/uL (ref 150–400)
RBC: 4.77 MIL/uL (ref 3.87–5.11)
RDW: 15.8 % — ABNORMAL HIGH (ref 11.5–15.5)
WBC: 5.2 10*3/uL (ref 4.0–10.5)
nRBC: 0 % (ref 0.0–0.2)

## 2021-12-28 MED ORDER — POTASSIUM CHLORIDE CRYS ER 20 MEQ PO TBCR: 40.0000 meq | EXTENDED_RELEASE_TABLET | Freq: Two times a day (BID) | ORAL | 0 refills | Status: DC

## 2021-12-28 MED ORDER — LACTATED RINGERS IV BOLUS
500.0000 mL | Freq: Once | INTRAVENOUS | Status: AC
  Administered 2021-12-28: 500 mL via INTRAVENOUS

## 2021-12-28 MED ORDER — POTASSIUM CHLORIDE 20 MEQ PO PACK
60.0000 meq | PACK | Freq: Two times a day (BID) | ORAL | Status: DC
Start: 2021-12-28 — End: 2021-12-28
  Administered 2021-12-28: 60 meq via ORAL
  Filled 2021-12-28: qty 3

## 2021-12-28 NOTE — ED Notes (Signed)
Pt states she felt dizzy upon standing for vs

## 2021-12-28 NOTE — ED Notes (Signed)
Pt ambulatory to bathroom

## 2021-12-28 NOTE — ED Provider Notes (Signed)
[facility] Provider Note   CSN: [id number] Arrival date & time: 12/28/21  0930     History {Add pertinent medical, surgical, social history, OB history to HPI:1} Chief Complaint  Patient presents with   Loss of Consciousness    [name] is a 46 y.o. female.  HPI 46 yo female prsents today with syncope.  Patient reports that she got lightheaded when she stood up this morning.  She has a known history of hypertension and reports being on for blood pressure medications which she takes regularly.  She has had some mild weight loss.  She reports eating and drinking as usual.  She denies chest pain or dyspnea.  States that she fell and struck her head.  She was awake immediately afterwards.  She does have some headache.     Home Medications Prior to Admission medications   Medication Sig Start Date End Date Taking? Authorizing Provider  albuterol (VENTOLIN HFA) 108 (90 Base) MCG/ACT inhaler Inhale 2 puffs into the lungs every 4 (four) hours as needed for wheezing or shortness of breath (cough, shortness of breath or wheezing.). 12/19/21   Ivonne Andrew, NP  budesonide-formoterol (SYMBICORT) 80-4.5 MCG/ACT inhaler Inhale 2 puffs into the lungs 2 (two) times daily. 12/19/21   Ivonne Andrew, NP  buPROPion (WELLBUTRIN SR) 150 MG 12 hr tablet Take 1 tablet (150 mg total) by mouth 2 (two) times daily. 12/19/21   Ivonne Andrew, NP  carvedilol (COREG) 25 MG tablet Take 1 tablet (25 mg total) by mouth 2 (two) times daily with a meal. 12/19/21 02/17/22  Ivonne Andrew, NP  cyclobenzaprine (FLEXERIL) 10 MG tablet Take 1 tablet (10 mg total) by mouth 2 (two) times daily as needed for muscle spasms. 12/19/21   Ivonne Andrew, NP  diazepam (VALIUM) 2 MG tablet Take one pill one hour prior to [facility] and then repeat just prior to mri if needed 04/22/21   Cristie Hem, PA-C  DULoxetine (CYMBALTA) 60 MG capsule Take 1 capsule (60 mg total) by mouth daily.  12/19/21   Ivonne Andrew, NP  famotidine (PEPCID) 20 MG tablet Take 1 tablet (20 mg total) by mouth 2 (two) times daily. 12/19/21 01/18/22  Ivonne Andrew, NP  furosemide (LASIX) 20 MG tablet Take 1 tablet (20 mg total) by mouth daily. 12/19/21 01/18/22  Ivonne Andrew, NP  gabapentin (NEURONTIN) 600 MG tablet Take 1 tablet (600 mg total) by mouth 2 (two) times daily. 12/19/21 02/17/22  Ivonne Andrew, NP  hydrALAZINE (APRESOLINE) 25 MG tablet Take 1 tablet (25 mg total) by mouth 3 (three) times daily. 12/19/21 01/18/22  Ivonne Andrew, NP  hydrochlorothiazide (HYDRODIURIL) 25 MG tablet Take 1 tablet (25 mg total) by mouth daily. 12/19/21 02/17/22  Ivonne Andrew, NP  HYDROcodone-acetaminophen (NORCO) 7.5-325 MG tablet Take 1-2 tablets by mouth 3 (three) times daily as needed for moderate pain. 04/13/21   Tarry Kos, MD  ibuprofen (ADVIL) 800 MG tablet Take 1 tablet (800 mg total) by mouth every 8 (eight) hours as needed. 07/21/19   Kallie Locks, FNP  lidocaine (LIDODERM) 5 % Place 1 patch onto the skin daily. Remove & Discard patch within 12 hours or as directed by MD Patient not taking: Reported on 12/19/2021 01/19/20   Henderly, Britni A, PA-C  methocarbamol (ROBAXIN) 500 MG tablet Take 1 tablet (500 mg total) by mouth 2 (two) times daily. 12/19/21   Ivonne Andrew, NP  nitroGLYCERIN (NITROSTAT) 0.3 MG SL tablet Place 1 tablet (0.3 mg total) under the tongue every 5 (five) minutes as needed for chest pain. 04/11/21   Passmore, Enid Derry I, NP  RESTASIS 0.05 % ophthalmic emulsion Place 1 drop into both eyes as directed. 11/13/18   Kallie Locks, FNP      Allergies    Patient has no known allergies.    Review of Systems   Review of Systems  Physical Exam Updated Vital Signs BP (!) 141/86 (BP Location: Right Arm)   Pulse (!) 57   Temp 98.8 F (37.1 C) (Oral)   Resp 18   Ht 1.626 m (5\' 4" )   Wt 51.2 kg   SpO2 99%   BMI 19.38 kg/m  Physical Exam  ED Results / Procedures /  Treatments   Labs (all labs ordered are listed, but only abnormal results are displayed) Labs Reviewed - No data to display  EKG None  Radiology No results found.  Procedures Procedures  {Document cardiac monitor, telemetry assessment procedure when appropriate:1}  Medications Ordered in ED Medications - No data to display  ED Course/ Medical Decision Making/ A&P Clinical Course as of 12/28/21 1250  Wed Dec 28, 2021  1247 CBC reviewed interpreted within normal limit [DR]  1247 Basic metabolic panel(!!) Pain better reviewed interpreted and significant for hypokalemia at 2.3 and potassium ordered for oral repletion [DR]  1249 Some orthostatic changes noted [DR]  1250 Troponin reviewed interpreted normal [DR]    Clinical Course User Index [DR] Dec 30, 2021, MD                           Medical Decision Making 46 year old female presents today after syncopal episode.  Patient on multiple blood pressure medications She does not report any nausea vomiting or volume depletion Differential diagnosis includes but is not limited to cardiac etiology such as arrhythmias, volume depletion, medication reaction, metabolic abnormalities. Patient evaluated with EKG which does not show evidence of acute ischemia or severe arrhythmia BC was obtained and patient is not significantly anemic Electrolytes returned and significant for hypokalemia with potassium 2.3 She is being orally repleted here in the ED She has some orthostasis She appears stable for discharge She will be discharged on potassium and advised regarding continue oral repletion and oral hydration.  She is advised to have a recheck of her potassium at her primary care tomorrow.  Amount and/or Complexity of Data Reviewed Labs: ordered. Decision-making details documented in ED Course. Radiology: ordered.  Risk Prescription drug management.   ***  {Document critical care time when appropriate:1} {Document review of labs  and clinical decision tools ie heart score, Chads2Vasc2 etc:1}  {Document your independent review of radiology images, and any outside records:1} {Document your discussion with family members, caretakers, and with consultants:1} {Document social determinants of health affecting pt's care:1} {Document your decision making why or why not admission, treatments were needed:1} Final Clinical Impression(s) / ED Diagnoses Final diagnoses:  None    Rx / DC Orders ED Discharge Orders     None

## 2021-12-28 NOTE — Discharge Instructions (Signed)
You were given 60 mEq of potassium here in the emergency department. Prescription for potassium has been sent to your pharmacy please pick it up and began this afternoon. Drink plenty of fluids Call your doctor and have recheck tomorrow. Return if you are having worsening symptoms

## 2021-12-28 NOTE — ED Triage Notes (Signed)
Pt bib ems from home after syncopal episode. Pt states she started feeling dizzy and next thing she remembers is waking up on the bathroom floor. Pt c/o right forehead, right hip and right knee pain. Pt not on blood thinners.   EMS initial bp 80/62 136/72 after 500cc NS bolus 78 HR 16 RR 99% room air 165 cbg

## 2021-12-30 ENCOUNTER — Ambulatory Visit: Payer: Self-pay

## 2022-01-04 NOTE — Progress Notes (Deleted)
Cardiology Office Note:    Date:  01/04/2022   ID:  [name], DOB Apr 09, 1976, MRN [id number]  PCP:  Kathrynn Speed, NP   [facility] Providers Cardiologist:  Charlton Haws, MD { Click to update primary MD,subspecialty MD or APP then REFRESH:1}    Referring MD: Kathrynn Speed, NP   Chief Complaint: ***  History of Present Illness:    [name] is a *** 46 y.o. female with a hx of hypertension, sickle cell trait, tobacco abuse, dilated cardiomyopathy, GERD, and medical noncompliance.  In 2019 she was admitted with chest pain and found to have been markedly hypertensive secondary to noncompliance.  Echocardiogram showed LVEF 35 to 40%, mild LVH, G2 DD, mild RV dysfunction.  LV dysfunction felt to be hypertensive cardiomyopathy and she was started on Entresto, however she is not always compliant with therapy. Sepetember 2019, referred for stress testing which was low risk, no ischemia, EF 48% but visually estimated at 55%.   On 09/21/19 had telehealth visit with Norma Fredrickson, NP, but no BP available.  She described more swelling and dyspnea.  Reported better coverage for medications with Medicaid.  She was prescribed Lasix and told to follow-up in person for labs and echo.  Seen by Dr. Eden Emms on 10/24/2019. Lab work revealed normal BNP, mildly elevated transaminases, and stable kidney function and electrolytes. She was advised to get echo and follow-up in 6 months.  ED visit 12/28/21 for syncope. Reports lightheadedness when she stood. Had taken BP meds. Recent mild weight loss. Potassium was found to be very low, 2.3. No evidence of acute ischemia or arrhythmia on EKG. She was discharged home and advised to follow-up with PCP.   Today, she is here    Past Medical History:  Diagnosis Date   Anemia    CHF (congestive heart failure) (HCC)    Common migraine with intractable migraine 01/25/2017   GERD (gastroesophageal reflux disease)    Hypertension    Miscarriage     x3   Sickle cell trait (HCC)    Vitamin D deficiency 07/2019    Past Surgical History:  Procedure Laterality Date   BREAST SURGERY     cyst removal 1990   DILATION AND CURETTAGE OF UTERUS     four miscarriages   TUBAL LIGATION Bilateral 08/17/2012   Procedure: POST PARTUM TUBAL LIGATION;  Surgeon: Allie Bossier, MD;  Location: [facility];  Service: Gynecology;  Laterality: Bilateral;    Current Medications: No outpatient medications have been marked as taking for the 01/05/22 encounter (Appointment) with Lissa Hoard, Zachary George, NP.     Allergies:   Patient has no known allergies.   Social History   Socioeconomic History   Marital status: Single    Spouse name: Not on file   Number of children: 2   Years of education: 11   Highest education level: Not on file  Occupational History   Occupation: Unemployed  Tobacco Use   Smoking status: Every Day    Packs/day: 0.30    Years: 10.00    Total pack years: 3.00    Types: Cigarettes   Smokeless tobacco: Never  Vaping Use   Vaping Use: Never used  Substance and Sexual Activity   Alcohol use: Yes    Alcohol/week: 15.0 - 19.0 standard drinks of alcohol    Types: 10 - 12 Cans of beer, 5 - 7 Shots of liquor per week    Comment: heavy alcohol use   Drug use:  Yes    Frequency: 7.0 times per week    Types: Marijuana    Comment: Every day   Sexual activity: Yes    Birth control/protection: None  Other Topics Concern   Not on file  Social History Narrative   Lives with mother, Elease Hashimoto   Caffeine use: Tea daily   Left handed   Social Determinants of Health   Financial Resource Strain: Not on file  Food Insecurity: Not on file  Transportation Needs: Not on file  Physical Activity: Not on file  Stress: Not on file  Social Connections: Not on file     Family History: The patient's ***family history includes Hypertension in her maternal grandfather, maternal grandmother, and mother. There is no history of Other.  ROS:   Please  see the history of present illness.    *** All other systems reviewed and are negative.  Labs/Other Studies Reviewed:    The following studies were reviewed today:  Lexiscan myoview 04/2018  Nuclear stress EF is calculated at 48% but visually appears normal at 55%. There was no ST segment deviation noted during stress. There is evidence of apical thinning but no ischemia noted. This is a low risk study.  Echo 10/2016  Left ventricle: The cavity size was normal. Wall thickness was    increased in a pattern of mild LVH. Systolic function was    moderately reduced. The estimated ejection fraction was in the    range of 35% to 40%. Features are consistent with a pseudonormal    left ventricular filling pattern, with concomitant abnormal    relaxation and increased filling pressure (grade 2 diastolic    dysfunction).  - Right ventricle: Systolic function was mildly reduced.   Recent Labs: 02/04/2021: B Natriuretic Peptide 50.4 12/19/2021: ALT 29 12/28/2021: BUN 27; Creatinine, Ser 1.05; Hemoglobin 13.4; Platelets 301; Potassium 2.3; Sodium 138  Recent Lipid Panel    Component Value Date/Time   CHOL 197 04/11/2021 1217   TRIG 96 04/11/2021 1217   HDL 101 04/11/2021 1217   CHOLHDL 2.0 04/11/2021 1217   LDLCALC 79 04/11/2021 1217     Risk Assessment/Calculations:   {Does this patient have ATRIAL FIBRILLATION?[phone]       Physical Exam:    VS:  There were no vitals taken for this visit.    Wt Readings from Last 3 Encounters:  12/28/21 112 lb 14 oz (51.2 kg)  12/19/21 115 lb (52.2 kg)  04/11/21 111 lb 0.6 oz (50.4 kg)     GEN: *** Well nourished, well developed in no acute distress HEENT: Normal NECK: No JVD; No carotid bruits CARDIAC: ***RRR, no murmurs, rubs, gallops RESPIRATORY:  Clear to auscultation without rales, wheezing or rhonchi  ABDOMEN: Soft, non-tender, non-distended MUSCULOSKELETAL:  No edema; No deformity. *** pedal pulses, ***bilaterally SKIN: Warm  and dry NEUROLOGIC:  Alert and oriented x 3 PSYCHIATRIC:  Normal affect   EKG:  EKG is *** ordered today.  The ekg ordered today demonstrates ***  Diagnoses:    No diagnosis found. Assessment and Plan:     NICM: Hypertension: Hypokalemia:  {Are you ordering a CV Procedure (e.g. stress test, cath, DCCV, TEE, etc)?   Press F2        :[id number]}    Medication Adjustments/Labs and Tests Ordered: Current medicines are reviewed at length with the patient today.  Concerns regarding medicines are outlined above.  No orders of the defined types were placed in this encounter.  No orders of the defined types  were placed in this encounter.   There are no Patient Instructions on file for this visit.   Signed, Levi Aland, NP  01/04/2022 5:20 AM    [facility]

## 2022-01-05 ENCOUNTER — Ambulatory Visit: Payer: Medicaid Other | Admitting: Nurse Practitioner

## 2022-01-12 ENCOUNTER — Other Ambulatory Visit: Payer: Self-pay | Admitting: Nurse Practitioner

## 2022-01-12 DIAGNOSIS — R2231 Localized swelling, mass and lump, right upper limb: Secondary | ICD-10-CM

## 2022-01-12 DIAGNOSIS — R2232 Localized swelling, mass and lump, left upper limb: Secondary | ICD-10-CM

## 2022-03-22 ENCOUNTER — Ambulatory Visit: Payer: Self-pay | Admitting: Nurse Practitioner

## 2022-07-05 ENCOUNTER — Ambulatory Visit (INDEPENDENT_AMBULATORY_CARE_PROVIDER_SITE_OTHER): Payer: Medicaid Other | Admitting: Nurse Practitioner

## 2022-07-05 ENCOUNTER — Encounter: Payer: Self-pay | Admitting: Nurse Practitioner

## 2022-07-05 VITALS — BP 156/92 | HR 80 | Ht 64.0 in | Wt 114.0 lb

## 2022-07-05 DIAGNOSIS — I10 Essential (primary) hypertension: Secondary | ICD-10-CM

## 2022-07-05 DIAGNOSIS — F3289 Other specified depressive episodes: Secondary | ICD-10-CM

## 2022-07-05 DIAGNOSIS — I509 Heart failure, unspecified: Secondary | ICD-10-CM | POA: Diagnosis not present

## 2022-07-05 DIAGNOSIS — Z13220 Encounter for screening for lipoid disorders: Secondary | ICD-10-CM | POA: Diagnosis not present

## 2022-07-05 DIAGNOSIS — R0789 Other chest pain: Secondary | ICD-10-CM | POA: Diagnosis not present

## 2022-07-05 DIAGNOSIS — Z1231 Encounter for screening mammogram for malignant neoplasm of breast: Secondary | ICD-10-CM

## 2022-07-05 MED ORDER — ALBUTEROL SULFATE HFA 108 (90 BASE) MCG/ACT IN AERS
2.0000 | INHALATION_SPRAY | RESPIRATORY_TRACT | 11 refills | Status: AC | PRN
Start: 2022-07-05 — End: ?

## 2022-07-05 MED ORDER — FUROSEMIDE 20 MG PO TABS: 20.0000 mg | ORAL_TABLET | Freq: Every day | ORAL | 0 refills | Status: DC

## 2022-07-05 MED ORDER — CYCLOBENZAPRINE HCL 10 MG PO TABS: 10.0000 mg | ORAL_TABLET | Freq: Two times a day (BID) | ORAL | 3 refills | Status: DC | PRN

## 2022-07-05 MED ORDER — NITROGLYCERIN 0.3 MG SL SUBL: 0.3000 mg | SUBLINGUAL_TABLET | SUBLINGUAL | 3 refills | Status: AC | PRN

## 2022-07-05 MED ORDER — POTASSIUM CHLORIDE CRYS ER 20 MEQ PO TBCR
40.0000 meq | EXTENDED_RELEASE_TABLET | Freq: Two times a day (BID) | ORAL | 0 refills | Status: AC
Start: 2022-07-05 — End: ?

## 2022-07-05 MED ORDER — DULOXETINE HCL 60 MG PO CPEP: 60.0000 mg | ORAL_CAPSULE | Freq: Every day | ORAL | 1 refills | Status: AC

## 2022-07-05 MED ORDER — HYDROCHLOROTHIAZIDE 25 MG PO TABS: 25.0000 mg | ORAL_TABLET | Freq: Every day | ORAL | 1 refills | Status: DC

## 2022-07-05 NOTE — Assessment & Plan Note (Signed)
-   nitroGLYCERIN (NITROSTAT) 0.3 MG SL tablet; Place 1 tablet (0.3 mg total) under the tongue every 5 (five) minutes as needed for chest pain.  Dispense: 90 tablet; Refill: 3  2. Chest discomfort  - nitroGLYCERIN (NITROSTAT) 0.3 MG SL tablet; Place 1 tablet (0.3 mg total) under the tongue every 5 (five) minutes as needed for chest pain.  Dispense: 90 tablet; Refill: 3  3. Chronic congestive heart failure, unspecified heart failure type (HCC)  - furosemide (LASIX) 20 MG tablet; Take 1 tablet (20 mg total) by mouth daily.  Dispense: 30 tablet; Refill: 0  4. Essential hypertension  - hydrochlorothiazide (HYDRODIURIL) 25 MG tablet; Take 1 tablet (25 mg total) by mouth daily.  Dispense: 30 tablet; Refill: 1 - CBC - Comprehensive metabolic panel - Lipid Panel  5. Other depression  - DULoxetine (CYMBALTA) 60 MG capsule; Take 1 capsule (60 mg total) by mouth daily.  Dispense: 30 capsule; Refill: 1  6. Lipid screening  - Lipid Panel  7. Encounter for screening mammogram for malignant neoplasm of breast  - MM Digital Screening  Follow up:  Follow up in 6 months

## 2022-07-05 NOTE — Progress Notes (Signed)
@Patient  ID: , female    DOB: Jul 15, 1975, 47 y.o.   MRN: [id number]  Chief Complaint  Patient presents with   Follow-up    Referring provider: [id number], NP   HPI  [name] 47 y.o. female with extensive medical history, including  has a past medical history of Anemia, CHF (congestive heart failure) (HCC), Common migraine with intractable migraine (01/25/2017), GERD (gastroesophageal reflux disease), Hypertension, Miscarriage, Sickle cell trait (HCC), and Vitamin D deficiency (07/2019).    Patient presents today for follow-up on chronic conditions.  Overall she states that she has been doing well since her last visit here.  She is due for blood work today.  She does need medication refills today. Denies f/c/s, n/v/d, hemoptysis, PND, leg swelling Denies chest pain or edema      No Known Allergies  Immunization History  Administered Date(s) Administered   Influenza Split 05/23/2012   Influenza,inj,Quad PF,6+ Mos 02/20/2017, 04/08/2018   Pneumococcal Polysaccharide-23 08/18/2012, 11/09/2016   Tdap 06/13/2012    Past Medical History:  Diagnosis Date   Anemia    CHF (congestive heart failure) (HCC)    Common migraine with intractable migraine 01/25/2017   GERD (gastroesophageal reflux disease)    Hypertension    Miscarriage    x3   Sickle cell trait (HCC)    Vitamin D deficiency 07/2019    Tobacco History: Social History   Tobacco Use  Smoking Status Every Day   Packs/day: 0.30   Years: 10.00   Total pack years: 3.00   Types: Cigarettes  Smokeless Tobacco Never   Ready to quit: Not Answered Counseling given: Not Answered   Outpatient Encounter Medications as of 07/05/2022  Medication Sig   budesonide-formoterol (SYMBICORT) 80-4.5 MCG/ACT inhaler Inhale 2 puffs into the lungs 2 (two) times daily.   buPROPion (WELLBUTRIN SR) 150 MG 12 hr tablet Take 1 tablet (150 mg total) by mouth 2 (two) times daily.   diazepam (VALIUM) 2 MG  tablet Take one pill one hour prior to mri and then repeat just prior to mri if needed   HYDROcodone-acetaminophen (NORCO) 7.5-325 MG tablet Take 1-2 tablets by mouth 3 (three) times daily as needed for moderate pain.   ibuprofen (ADVIL) 800 MG tablet Take 1 tablet (800 mg total) by mouth every 8 (eight) hours as needed.   lidocaine (LIDODERM) 5 % Place 1 patch onto the skin daily. Remove & Discard patch within 12 hours or as directed by MD   methocarbamol (ROBAXIN) 500 MG tablet Take 1 tablet (500 mg total) by mouth 2 (two) times daily.   RESTASIS 0.05 % ophthalmic emulsion Place 1 drop into both eyes as directed.   [DISCONTINUED] albuterol (VENTOLIN HFA) 108 (90 Base) MCG/ACT inhaler Inhale 2 puffs into the lungs every 4 (four) hours as needed for wheezing or shortness of breath (cough, shortness of breath or wheezing.).   [DISCONTINUED] cyclobenzaprine (FLEXERIL) 10 MG tablet Take 1 tablet (10 mg total) by mouth 2 (two) times daily as needed for muscle spasms.   [DISCONTINUED] DULoxetine (CYMBALTA) 60 MG capsule Take 1 capsule (60 mg total) by mouth daily.   [DISCONTINUED] nitroGLYCERIN (NITROSTAT) 0.3 MG SL tablet Place 1 tablet (0.3 mg total) under the tongue every 5 (five) minutes as needed for chest pain.   [DISCONTINUED] potassium chloride SA (KLOR-CON M) 20 MEQ tablet Take 2 tablets (40 mEq total) by mouth 2 (two) times daily.   albuterol (VENTOLIN HFA) 108 (90 Base) MCG/ACT inhaler Inhale 2  puffs into the lungs every 4 (four) hours as needed for wheezing or shortness of breath (cough, shortness of breath or wheezing.).   carvedilol (COREG) 25 MG tablet Take 1 tablet (25 mg total) by mouth 2 (two) times daily with a meal.   cyclobenzaprine (FLEXERIL) 10 MG tablet Take 1 tablet (10 mg total) by mouth 2 (two) times daily as needed for muscle spasms.   DULoxetine (CYMBALTA) 60 MG capsule Take 1 capsule (60 mg total) by mouth daily.   famotidine (PEPCID) 20 MG tablet Take 1 tablet (20 mg total)  by mouth 2 (two) times daily.   furosemide (LASIX) 20 MG tablet Take 1 tablet (20 mg total) by mouth daily.   gabapentin (NEURONTIN) 600 MG tablet Take 1 tablet (600 mg total) by mouth 2 (two) times daily.   hydrALAZINE (APRESOLINE) 25 MG tablet Take 1 tablet (25 mg total) by mouth 3 (three) times daily.   hydrochlorothiazide (HYDRODIURIL) 25 MG tablet Take 1 tablet (25 mg total) by mouth daily.   nitroGLYCERIN (NITROSTAT) 0.3 MG SL tablet Place 1 tablet (0.3 mg total) under the tongue every 5 (five) minutes as needed for chest pain.   potassium chloride SA (KLOR-CON M) 20 MEQ tablet Take 2 tablets (40 mEq total) by mouth 2 (two) times daily.   [DISCONTINUED] furosemide (LASIX) 20 MG tablet Take 1 tablet (20 mg total) by mouth daily.   [DISCONTINUED] hydrochlorothiazide (HYDRODIURIL) 25 MG tablet Take 1 tablet (25 mg total) by mouth daily.   No facility-administered encounter medications on file as of 07/05/2022.     Review of Systems  Review of Systems  Constitutional: Negative.   HENT: Negative.    Cardiovascular: Negative.   Gastrointestinal: Negative.   Allergic/Immunologic: Negative.   Neurological: Negative.   Psychiatric/Behavioral: Negative.         Physical Exam  BP (!) 156/92   Pulse 80   Ht 5\' 4"  (1.626 m)   Wt 114 lb (51.7 kg)   SpO2 98%   BMI 19.57 kg/m   Wt Readings from Last 5 Encounters:  07/05/22 114 lb (51.7 kg)  12/28/21 112 lb 14 oz (51.2 kg)  12/19/21 115 lb (52.2 kg)  04/11/21 111 lb 0.6 oz (50.4 kg)  04/12/20 118 lb (53.5 kg)     Physical Exam Vitals and nursing note reviewed.  Constitutional:      General: She is not in acute distress.    Appearance: She is well-developed.  Cardiovascular:     Rate and Rhythm: Normal rate and regular rhythm.  Pulmonary:     Effort: Pulmonary effort is normal.     Breath sounds: Normal breath sounds.  Neurological:     Mental Status: She is alert and oriented to person, place, and time.      Lab  Results:  CBC    Component Value Date/Time   WBC 5.2 12/28/2021 1150   RBC 4.77 12/28/2021 1150   HGB 13.4 12/28/2021 1150   HGB 12.0 12/19/2021 1144   HCT 40.5 12/28/2021 1150   HCT 37.1 12/19/2021 1144   PLT 301 12/28/2021 1150   PLT 298 12/19/2021 1144   MCV 84.9 12/28/2021 1150   MCV 88 12/19/2021 1144   MCH 28.1 12/28/2021 1150   MCHC 33.1 12/28/2021 1150   RDW 15.8 (H) 12/28/2021 1150   RDW 15.0 12/19/2021 1144   LYMPHSABS 2.0 09/02/2021 1240   LYMPHSABS 1.6 04/11/2021 1217   MONOABS 0.3 09/02/2021 1240   EOSABS 0.2 09/02/2021 1240  EOSABS 0.2 04/11/2021 1217   BASOSABS 0.1 09/02/2021 1240   BASOSABS 0.1 04/11/2021 1217    BMET    Component Value Date/Time   NA 138 12/28/2021 1150   NA 141 12/19/2021 1144   K 2.3 (LL) 12/28/2021 1150   CL 96 (L) 12/28/2021 1150   CO2 30 12/28/2021 1150   GLUCOSE 99 12/28/2021 1150   BUN 27 (H) 12/28/2021 1150   BUN 11 12/19/2021 1144   CREATININE 1.05 (H) 12/28/2021 1150   CREATININE 0.81 02/20/2017 1343   CALCIUM 9.4 12/28/2021 1150   GFRNONAA >60 12/28/2021 1150   GFRNONAA >89 11/14/2016 1513   GFRAA 110 01/19/2020 1132   GFRAA >89 11/14/2016 1513    BNP    Component Value Date/Time   BNP 50.4 02/04/2021 1247   BNP 11.4 02/20/2017 1343    ProBNP    Component Value Date/Time   PROBNP 23 10/24/2019 1035    Imaging: No results found.   Assessment & Plan:   Chest pain - nitroGLYCERIN (NITROSTAT) 0.3 MG SL tablet; Place 1 tablet (0.3 mg total) under the tongue every 5 (five) minutes as needed for chest pain.  Dispense: 90 tablet; Refill: 3  2. Chest discomfort  - nitroGLYCERIN (NITROSTAT) 0.3 MG SL tablet; Place 1 tablet (0.3 mg total) under the tongue every 5 (five) minutes as needed for chest pain.  Dispense: 90 tablet; Refill: 3  3. Chronic congestive heart failure, unspecified heart failure type (HCC)  - furosemide (LASIX) 20 MG tablet; Take 1 tablet (20 mg total) by mouth daily.  Dispense: 30  tablet; Refill: 0  4. Essential hypertension  - hydrochlorothiazide (HYDRODIURIL) 25 MG tablet; Take 1 tablet (25 mg total) by mouth daily.  Dispense: 30 tablet; Refill: 1 - CBC - Comprehensive metabolic panel - Lipid Panel  5. Other depression  - DULoxetine (CYMBALTA) 60 MG capsule; Take 1 capsule (60 mg total) by mouth daily.  Dispense: 30 capsule; Refill: 1  6. Lipid screening  - Lipid Panel  7. Encounter for screening mammogram for malignant neoplasm of breast  - MM Digital Screening  Follow up:  Follow up in 6 months     Fenton Foy, NP 07/05/2022

## 2022-07-05 NOTE — Patient Instructions (Signed)
1. Other chest pain  - nitroGLYCERIN (NITROSTAT) 0.3 MG SL tablet; Place 1 tablet (0.3 mg total) under the tongue every 5 (five) minutes as needed for chest pain.  Dispense: 90 tablet; Refill: 3  2. Chest discomfort  - nitroGLYCERIN (NITROSTAT) 0.3 MG SL tablet; Place 1 tablet (0.3 mg total) under the tongue every 5 (five) minutes as needed for chest pain.  Dispense: 90 tablet; Refill: 3  3. Chronic congestive heart failure, unspecified heart failure type (HCC)  - furosemide (LASIX) 20 MG tablet; Take 1 tablet (20 mg total) by mouth daily.  Dispense: 30 tablet; Refill: 0  4. Essential hypertension  - hydrochlorothiazide (HYDRODIURIL) 25 MG tablet; Take 1 tablet (25 mg total) by mouth daily.  Dispense: 30 tablet; Refill: 1 - CBC - Comprehensive metabolic panel - Lipid Panel  5. Other depression  - DULoxetine (CYMBALTA) 60 MG capsule; Take 1 capsule (60 mg total) by mouth daily.  Dispense: 30 capsule; Refill: 1  6. Lipid screening  - Lipid Panel  7. Encounter for screening mammogram for malignant neoplasm of breast  - MM Digital Screening  Follow up:  Follow up in 6 months

## 2022-07-06 LAB — CBC
Hematocrit: 38.7 % (ref 34.0–46.6)
Hemoglobin: 12.3 g/dL (ref 11.1–15.9)
MCH: 28.1 pg (ref 26.6–33.0)
MCHC: 31.8 g/dL (ref 31.5–35.7)
MCV: 88 fL (ref 79–97)
Platelets: 261 10*3/uL (ref 150–450)
RBC: 4.38 x10E6/uL (ref 3.77–5.28)
RDW: 15.4 % (ref 11.7–15.4)
WBC: 4.1 10*3/uL (ref 3.4–10.8)

## 2022-07-06 LAB — COMPREHENSIVE METABOLIC PANEL
ALT: 35 IU/L — ABNORMAL HIGH (ref 0–32)
AST: 28 IU/L (ref 0–40)
Albumin/Globulin Ratio: 1.9 (ref 1.2–2.2)
Albumin: 4.3 g/dL (ref 3.9–4.9)
Alkaline Phosphatase: 80 IU/L (ref 44–121)
BUN/Creatinine Ratio: 17 (ref 9–23)
BUN: 12 mg/dL (ref 6–24)
Bilirubin Total: 0.3 mg/dL (ref 0.0–1.2)
CO2: 22 mmol/L (ref 20–29)
Calcium: 9.8 mg/dL (ref 8.7–10.2)
Chloride: 105 mmol/L (ref 96–106)
Creatinine, Ser: 0.71 mg/dL (ref 0.57–1.00)
Globulin, Total: 2.3 g/dL (ref 1.5–4.5)
Glucose: 104 mg/dL — ABNORMAL HIGH (ref 70–99)
Potassium: 3.9 mmol/L (ref 3.5–5.2)
Sodium: 142 mmol/L (ref 134–144)
Total Protein: 6.6 g/dL (ref 6.0–8.5)
eGFR: 107 mL/min/{1.73_m2} (ref >59)

## 2022-07-06 LAB — LIPID PANEL
Chol/HDL Ratio: 2.5 ratio (ref 0.0–4.4)
Cholesterol, Total: 229 mg/dL — ABNORMAL HIGH (ref 100–199)
HDL: 91 mg/dL (ref >39)
LDL Chol Calc (NIH): 125 mg/dL — ABNORMAL HIGH (ref 0–99)
Triglycerides: 75 mg/dL (ref 0–149)
VLDL Cholesterol Cal: 13 mg/dL (ref 5–40)

## 2022-09-25 ENCOUNTER — Encounter: Payer: Self-pay | Admitting: Pharmacist

## 2022-10-04 ENCOUNTER — Encounter: Payer: Self-pay | Admitting: Nurse Practitioner

## 2022-10-04 ENCOUNTER — Ambulatory Visit (INDEPENDENT_AMBULATORY_CARE_PROVIDER_SITE_OTHER): Payer: Medicaid Other | Admitting: Nurse Practitioner

## 2022-10-04 DIAGNOSIS — I509 Heart failure, unspecified: Secondary | ICD-10-CM

## 2022-10-04 DIAGNOSIS — I10 Essential (primary) hypertension: Secondary | ICD-10-CM | POA: Diagnosis not present

## 2022-10-04 MED ORDER — HYDROCHLOROTHIAZIDE 25 MG PO TABS: 25.0000 mg | ORAL_TABLET | Freq: Every day | ORAL | 1 refills | Status: DC

## 2022-10-04 MED ORDER — CYCLOBENZAPRINE HCL 10 MG PO TABS: 10.0000 mg | ORAL_TABLET | Freq: Two times a day (BID) | ORAL | 3 refills | Status: AC | PRN

## 2022-10-04 MED ORDER — CARVEDILOL 25 MG PO TABS
25.0000 mg | ORAL_TABLET | Freq: Two times a day (BID) | ORAL | 1 refills | Status: AC
Start: 2022-10-04 — End: 2022-12-03

## 2022-10-04 NOTE — Assessment & Plan Note (Signed)
-   hydrochlorothiazide (HYDRODIURIL) 25 MG tablet; Take 1 tablet (25 mg total) by mouth daily.  Dispense: 30 tablet; Refill: 1 - CBC - Comprehensive metabolic panel  2. Primary hypertension  - carvedilol (COREG) 25 MG tablet; Take 1 tablet (25 mg total) by mouth 2 (two) times daily with a meal.  Dispense: 60 tablet; Refill: 1  3. Chronic congestive heart failure, unspecified heart failure type  - carvedilol (COREG) 25 MG tablet; Take 1 tablet (25 mg total) by mouth 2 (two) times daily with a meal.  Dispense: 60 tablet; Refill: 1  Follow up:  Follow up in 6 months

## 2022-10-04 NOTE — Progress Notes (Signed)
@Patient  ID: [name], female    DOB: 02-04-1976, 47 y.o.   MRN: [id number]  Chief Complaint  Patient presents with   Hypertension   Hyperlipidemia    Referring provider: Bo Merino I, NP   HPI  [name] 47 y.o. female with extensive medical history, including  has a past medical history of Anemia, CHF (congestive heart failure) ([place]), Common migraine with intractable migraine (01/25/2017), GERD (gastroesophageal reflux disease), Hypertension, Miscarriage, Sickle cell trait ([place]), and Vitamin D deficiency (07/2019).       Patient presents today for follow-up on chronic conditions.  Overall she states that she has been doing well since her last visit here.  She is due for blood work today.  No new issues or concerns today.  Denies f/c/s, n/v/d, hemoptysis, PND, leg swelling Denies chest pain or edema     No Known Allergies  Immunization History  Administered Date(s) Administered   Influenza Split 05/23/2012   Influenza,inj,Quad PF,6+ Mos 02/20/2017, 04/08/2018   Pneumococcal Polysaccharide-23 08/18/2012, 11/09/2016   Tdap 06/13/2012    Past Medical History:  Diagnosis Date   Anemia    CHF (congestive heart failure)    Common migraine with intractable migraine 01/25/2017   GERD (gastroesophageal reflux disease)    Hypertension    Miscarriage    x3   Sickle cell trait    Vitamin D deficiency 07/2019    Tobacco History: Social History   Tobacco Use  Smoking Status Every Day   Packs/day: 0.30   Years: 10.00   Additional pack years: 0.00   Total pack years: 3.00   Types: Cigarettes  Smokeless Tobacco Never   Ready to quit: Not Answered Counseling given: Not Answered   Outpatient Encounter Medications as of 10/04/2022  Medication Sig   albuterol (VENTOLIN HFA) 108 (90 Base) MCG/ACT inhaler Inhale 2 puffs into the lungs every 4 (four) hours as needed for wheezing or shortness of breath (cough, shortness of breath or wheezing.).    budesonide-formoterol (SYMBICORT) 80-4.5 MCG/ACT inhaler Inhale 2 puffs into the lungs 2 (two) times daily.   buPROPion (WELLBUTRIN SR) 150 MG 12 hr tablet Take 1 tablet (150 mg total) by mouth 2 (two) times daily.   diazepam (VALIUM) 2 MG tablet Take one pill one hour prior to mri and then repeat just prior to mri if needed   DULoxetine (CYMBALTA) 60 MG capsule Take 1 capsule (60 mg total) by mouth daily.   famotidine (PEPCID) 20 MG tablet Take 1 tablet (20 mg total) by mouth 2 (two) times daily.   gabapentin (NEURONTIN) 600 MG tablet Take 1 tablet (600 mg total) by mouth 2 (two) times daily.   hydrALAZINE (APRESOLINE) 25 MG tablet Take 1 tablet (25 mg total) by mouth 3 (three) times daily.   HYDROcodone-acetaminophen (NORCO) 7.5-325 MG tablet Take 1-2 tablets by mouth 3 (three) times daily as needed for moderate pain.   ibuprofen (ADVIL) 800 MG tablet Take 1 tablet (800 mg total) by mouth every 8 (eight) hours as needed.   methocarbamol (ROBAXIN) 500 MG tablet Take 1 tablet (500 mg total) by mouth 2 (two) times daily.   nitroGLYCERIN (NITROSTAT) 0.3 MG SL tablet Place 1 tablet (0.3 mg total) under the tongue every 5 (five) minutes as needed for chest pain.   potassium chloride SA (KLOR-CON M) 20 MEQ tablet Take 2 tablets (40 mEq total) by mouth 2 (two) times daily.   RESTASIS 0.05 % ophthalmic emulsion Place 1 drop into both eyes as  directed.   [DISCONTINUED] carvedilol (COREG) 25 MG tablet Take 1 tablet (25 mg total) by mouth 2 (two) times daily with a meal.   [DISCONTINUED] cyclobenzaprine (FLEXERIL) 10 MG tablet Take 1 tablet (10 mg total) by mouth 2 (two) times daily as needed for muscle spasms.   [DISCONTINUED] hydrochlorothiazide (HYDRODIURIL) 25 MG tablet Take 1 tablet (25 mg total) by mouth daily.   carvedilol (COREG) 25 MG tablet Take 1 tablet (25 mg total) by mouth 2 (two) times daily with a meal.   cyclobenzaprine (FLEXERIL) 10 MG tablet Take 1 tablet (10 mg total) by mouth 2 (two)  times daily as needed for muscle spasms.   furosemide (LASIX) 20 MG tablet Take 1 tablet (20 mg total) by mouth daily. (Patient not taking: Reported on 10/04/2022)   hydrochlorothiazide (HYDRODIURIL) 25 MG tablet Take 1 tablet (25 mg total) by mouth daily.   lidocaine (LIDODERM) 5 % Place 1 patch onto the skin daily. Remove & Discard patch within 12 hours or as directed by MD (Patient not taking: Reported on 10/04/2022)   No facility-administered encounter medications on file as of 10/04/2022.     Review of Systems  Review of Systems  Constitutional: Negative.   HENT: Negative.    Cardiovascular: Negative.   Gastrointestinal: Negative.   Allergic/Immunologic: Negative.   Neurological: Negative.   Psychiatric/Behavioral: Negative.         Physical Exam  BP (!) 144/89   Pulse 71   Temp (!) 97.2 F (36.2 C)   Wt 117 lb 12.8 oz (53.4 kg)   SpO2 100%   BMI 20.22 kg/m   Wt Readings from Last 5 Encounters:  10/04/22 117 lb 12.8 oz (53.4 kg)  07/05/22 114 lb (51.7 kg)  12/28/21 112 lb 14 oz (51.2 kg)  12/19/21 115 lb (52.2 kg)  04/11/21 111 lb 0.6 oz (50.4 kg)     Physical Exam Vitals and nursing note reviewed.  Constitutional:      General: She is not in acute distress.    Appearance: She is well-developed.  Cardiovascular:     Rate and Rhythm: Normal rate and regular rhythm.  Pulmonary:     Effort: Pulmonary effort is normal.     Breath sounds: Normal breath sounds.  Neurological:     Mental Status: She is alert and oriented to person, place, and time.      Lab Results:  CBC    Component Value Date/Time   WBC 4.1 07/05/2022 1016   WBC 5.2 12/28/2021 1150   RBC 4.38 07/05/2022 1016   RBC 4.77 12/28/2021 1150   HGB 12.3 07/05/2022 1016   HCT 38.7 07/05/2022 1016   PLT 261 07/05/2022 1016   MCV 88 07/05/2022 1016   MCH 28.1 07/05/2022 1016   MCH 28.1 12/28/2021 1150   MCHC 31.8 07/05/2022 1016   MCHC 33.1 12/28/2021 1150   RDW 15.4 07/05/2022 1016    LYMPHSABS 2.0 09/02/2021 1240   LYMPHSABS 1.6 04/11/2021 1217   MONOABS 0.3 09/02/2021 1240   EOSABS 0.2 09/02/2021 1240   EOSABS 0.2 04/11/2021 1217   BASOSABS 0.1 09/02/2021 1240   BASOSABS 0.1 04/11/2021 1217    BMET    Component Value Date/Time   NA 142 07/05/2022 1016   K 3.9 07/05/2022 1016   CL 105 07/05/2022 1016   CO2 22 07/05/2022 1016   GLUCOSE 104 (H) 07/05/2022 1016   GLUCOSE 99 12/28/2021 1150   BUN 12 07/05/2022 1016   CREATININE 0.71 07/05/2022 1016  CREATININE 0.81 02/20/2017 1343   CALCIUM 9.8 07/05/2022 1016   GFRNONAA >60 12/28/2021 1150   GFRNONAA >89 11/14/2016 1513   GFRAA 110 01/19/2020 1132   GFRAA >89 11/14/2016 1513    BNP    Component Value Date/Time   BNP 50.4 02/04/2021 1247   BNP 11.4 02/20/2017 1343    ProBNP    Component Value Date/Time   PROBNP 23 10/24/2019 1035    Imaging: No results found.   Assessment & Plan:   Hypertension - hydrochlorothiazide (HYDRODIURIL) 25 MG tablet; Take 1 tablet (25 mg total) by mouth daily.  Dispense: 30 tablet; Refill: 1 - CBC - Comprehensive metabolic panel  2. Primary hypertension  - carvedilol (COREG) 25 MG tablet; Take 1 tablet (25 mg total) by mouth 2 (two) times daily with a meal.  Dispense: 60 tablet; Refill: 1  3. Chronic congestive heart failure, unspecified heart failure type  - carvedilol (COREG) 25 MG tablet; Take 1 tablet (25 mg total) by mouth 2 (two) times daily with a meal.  Dispense: 60 tablet; Refill: 1  Follow up:  Follow up in 6 months     Fenton Foy, NP 10/04/2022

## 2022-10-04 NOTE — Patient Instructions (Addendum)
1. Essential hypertension  - hydrochlorothiazide (HYDRODIURIL) 25 MG tablet; Take 1 tablet (25 mg total) by mouth daily.  Dispense: 30 tablet; Refill: 1 - CBC - Comprehensive metabolic panel  2. Primary hypertension  - carvedilol (COREG) 25 MG tablet; Take 1 tablet (25 mg total) by mouth 2 (two) times daily with a meal.  Dispense: 60 tablet; Refill: 1  3. Chronic congestive heart failure, unspecified heart failure type  - carvedilol (COREG) 25 MG tablet; Take 1 tablet (25 mg total) by mouth 2 (two) times daily with a meal.  Dispense: 60 tablet; Refill: 1  Follow up:  Follow up in 6 months

## 2022-10-05 LAB — COMPREHENSIVE METABOLIC PANEL
ALT: 35 IU/L — ABNORMAL HIGH (ref 0–32)
AST: 54 IU/L — ABNORMAL HIGH (ref 0–40)
Albumin/Globulin Ratio: 1.5 (ref 1.2–2.2)
Albumin: 3.7 g/dL — ABNORMAL LOW (ref 3.9–4.9)
Alkaline Phosphatase: 123 IU/L — ABNORMAL HIGH (ref 44–121)
BUN/Creatinine Ratio: 19 (ref 9–23)
BUN: 13 mg/dL (ref 6–24)
Bilirubin Total: <0.2 mg/dL (ref 0.0–1.2)
CO2: 19 mmol/L — ABNORMAL LOW (ref 20–29)
Calcium: 8.7 mg/dL (ref 8.7–10.2)
Chloride: 110 mmol/L — ABNORMAL HIGH (ref 96–106)
Creatinine, Ser: 0.68 mg/dL (ref 0.57–1.00)
Globulin, Total: 2.4 g/dL (ref 1.5–4.5)
Glucose: 80 mg/dL (ref 70–99)
Potassium: 4.1 mmol/L (ref 3.5–5.2)
Sodium: 144 mmol/L (ref 134–144)
Total Protein: 6.1 g/dL (ref 6.0–8.5)
eGFR: 110 mL/min/{1.73_m2} (ref >59)

## 2022-10-05 LAB — CBC
Hematocrit: 34.5 % (ref 34.0–46.6)
Hemoglobin: 10.6 g/dL — ABNORMAL LOW (ref 11.1–15.9)
MCH: 28.6 pg (ref 26.6–33.0)
MCHC: 30.7 g/dL — ABNORMAL LOW (ref 31.5–35.7)
MCV: 93 fL (ref 79–97)
Platelets: 253 10*3/uL (ref 150–450)
RBC: 3.71 x10E6/uL — ABNORMAL LOW (ref 3.77–5.28)
RDW: 15.9 % — ABNORMAL HIGH (ref 11.7–15.4)
WBC: 3.5 10*3/uL (ref 3.4–10.8)

## 2023-03-09 ENCOUNTER — Other Ambulatory Visit: Payer: Self-pay

## 2023-03-09 NOTE — Progress Notes (Signed)
Patient attempted to be outreached by Sofie Rower, PharmD to discuss hypertension. Unable to leave voicemail for patient to return our call.   Sofie Rower, PharmD, [employer] PGY1

## 2023-04-05 ENCOUNTER — Ambulatory Visit (INDEPENDENT_AMBULATORY_CARE_PROVIDER_SITE_OTHER): Payer: 59 | Admitting: Nurse Practitioner

## 2023-04-05 ENCOUNTER — Encounter: Payer: Self-pay | Admitting: Nurse Practitioner

## 2023-04-05 VITALS — BP 160/98 | HR 74 | Temp 97.5°F | Wt 123.0 lb

## 2023-04-05 DIAGNOSIS — Z13220 Encounter for screening for lipoid disorders: Secondary | ICD-10-CM | POA: Diagnosis not present

## 2023-04-05 DIAGNOSIS — M25551 Pain in right hip: Secondary | ICD-10-CM | POA: Diagnosis not present

## 2023-04-05 DIAGNOSIS — I10 Essential (primary) hypertension: Secondary | ICD-10-CM | POA: Diagnosis not present

## 2023-04-05 DIAGNOSIS — J4520 Mild intermittent asthma, uncomplicated: Secondary | ICD-10-CM

## 2023-04-05 DIAGNOSIS — K219 Gastro-esophageal reflux disease without esophagitis: Secondary | ICD-10-CM | POA: Diagnosis not present

## 2023-04-05 DIAGNOSIS — I509 Heart failure, unspecified: Secondary | ICD-10-CM

## 2023-04-05 DIAGNOSIS — Z1211 Encounter for screening for malignant neoplasm of colon: Secondary | ICD-10-CM

## 2023-04-05 DIAGNOSIS — M25552 Pain in left hip: Secondary | ICD-10-CM

## 2023-04-05 MED ORDER — CARVEDILOL 25 MG PO TABS: 25.0000 mg | ORAL_TABLET | Freq: Two times a day (BID) | ORAL | 1 refills | Status: AC

## 2023-04-05 MED ORDER — FAMOTIDINE 20 MG PO TABS: 20.0000 mg | ORAL_TABLET | Freq: Two times a day (BID) | ORAL | 0 refills | Status: AC

## 2023-04-05 MED ORDER — ALBUTEROL SULFATE HFA 108 (90 BASE) MCG/ACT IN AERS
2.0000 | INHALATION_SPRAY | RESPIRATORY_TRACT | 11 refills | Status: AC | PRN
Start: 2023-04-05 — End: ?

## 2023-04-05 MED ORDER — HYDRALAZINE HCL 25 MG PO TABS: 25.0000 mg | ORAL_TABLET | Freq: Three times a day (TID) | ORAL | 0 refills | Status: AC

## 2023-04-05 MED ORDER — HYDROCHLOROTHIAZIDE 25 MG PO TABS: 25.0000 mg | ORAL_TABLET | Freq: Every day | ORAL | 1 refills | Status: AC

## 2023-04-05 MED ORDER — BUDESONIDE-FORMOTEROL FUMARATE 80-4.5 MCG/ACT IN AERO: 2.0000 | INHALATION_SPRAY | Freq: Two times a day (BID) | RESPIRATORY_TRACT | 11 refills | Status: AC

## 2023-04-05 MED ORDER — GABAPENTIN 600 MG PO TABS: 600.0000 mg | ORAL_TABLET | Freq: Two times a day (BID) | ORAL | 1 refills | Status: AC

## 2023-04-05 NOTE — Patient Instructions (Signed)
1. Primary hypertension  - CBC - Basic Metabolic Panel - carvedilol (COREG) 25 MG tablet; Take 1 tablet (25 mg total) by mouth 2 (two) times daily with a meal.  Dispense: 60 tablet; Refill: 1  2. Lipid screening  - Lipid Panel  3. Gastroesophageal reflux disease without esophagitis  - famotidine (PEPCID) 20 MG tablet; Take 1 tablet (20 mg total) by mouth 2 (two) times daily.  Dispense: 60 tablet; Refill: 0  4. Bilateral hip pain  - gabapentin (NEURONTIN) 600 MG tablet; Take 1 tablet (600 mg total) by mouth 2 (two) times daily.  Dispense: 60 tablet; Refill: 1  5. Chronic congestive heart failure, unspecified heart failure type (HCC)  - carvedilol (COREG) 25 MG tablet; Take 1 tablet (25 mg total) by mouth 2 (two) times daily with a meal.  Dispense: 60 tablet; Refill: 1  6. Essential hypertension  - hydrochlorothiazide (HYDRODIURIL) 25 MG tablet; Take 1 tablet (25 mg total) by mouth daily.  Dispense: 30 tablet; Refill: 1  Follow up:  Follow up in 6 months

## 2023-04-05 NOTE — Progress Notes (Signed)
Cologuard   Subjective   Patient ID: [name], female    DOB: 06/16/1976, 47 y.o.   MRN: [id number]  Chief Complaint  Patient presents with  . Hypertension    Referring provider: Ivonne Andrew, NP  [name] is a 47 y.o. female with Past Medical History: No date: Anemia No date: CHF (congestive heart failure) (HCC) 01/25/2017: Common migraine with intractable migraine No date: GERD (gastroesophageal reflux disease) No date: Hypertension No date: Miscarriage     Comment:  x3 No date: Sickle cell trait (H[facility]) 07/2019: Vitamin D deficiency   HPI  Patient presents today for follow-up on chronic conditions.  Overall she states that she has been doing well since her last visit here.  She is due for blood work today.  No new issues or concerns today.  Blood pressure was elevated in office today, Patient did not take medications today. Denies f/c/s, n/v/d, hemoptysis, PND, leg swelling. Denies chest pain or edema.    No Known Allergies  Immunization History  Administered Date(s) Administered  . Influenza Split 05/23/2012  . Influenza,inj,Quad PF,6+ Mos 02/20/2017, 04/08/2018  . Pneumococcal Polysaccharide-23 08/18/2012, 11/09/2016  . Tdap 06/13/2012    Tobacco History: Social History   Tobacco Use  Smoking Status Every Day  . Current packs/day: 0.30  . Average packs/day: 0.3 packs/day for 10.0 years (3.0 ttl pk-yrs)  . Types: Cigarettes  Smokeless Tobacco Never   Ready to quit: Not Answered Counseling given: Not Answered   Outpatient Encounter Medications as of 04/05/2023  Medication Sig  . buPROPion (WELLBUTRIN SR) 150 MG 12 hr tablet Take 1 tablet (150 mg total) by mouth 2 (two) times daily.  . cyclobenzaprine (FLEXERIL) 10 MG tablet Take 1 tablet (10 mg total) by mouth 2 (two) times daily as needed for muscle spasms.  . diazepam (VALIUM) 2 MG tablet Take one pill one hour prior to mri and then repeat just prior to mri if needed  . DULoxetine  (CYMBALTA) 60 MG capsule Take 1 capsule (60 mg total) by mouth daily.  Marland Kitchen ibuprofen (ADVIL) 800 MG tablet Take 1 tablet (800 mg total) by mouth every 8 (eight) hours as needed.  . nitroGLYCERIN (NITROSTAT) 0.3 MG SL tablet Place 1 tablet (0.3 mg total) under the tongue every 5 (five) minutes as needed for chest pain.  Marland Kitchen RESTASIS 0.05 % ophthalmic emulsion Place 1 drop into both eyes as directed.  . [DISCONTINUED] albuterol (VENTOLIN HFA) 108 (90 Base) MCG/ACT inhaler Inhale 2 puffs into the lungs every 4 (four) hours as needed for wheezing or shortness of breath (cough, shortness of breath or wheezing.).  . [DISCONTINUED] budesonide-formoterol (SYMBICORT) 80-4.5 MCG/ACT inhaler Inhale 2 puffs into the lungs 2 (two) times daily.  . [DISCONTINUED] carvedilol (COREG) 25 MG tablet Take 1 tablet (25 mg total) by mouth 2 (two) times daily with a meal.  . [DISCONTINUED] famotidine (PEPCID) 20 MG tablet Take 1 tablet (20 mg total) by mouth 2 (two) times daily.  . [DISCONTINUED] gabapentin (NEURONTIN) 600 MG tablet Take 1 tablet (600 mg total) by mouth 2 (two) times daily.  . [DISCONTINUED] hydrochlorothiazide (HYDRODIURIL) 25 MG tablet Take 1 tablet (25 mg total) by mouth daily.  . [DISCONTINUED] potassium chloride SA (KLOR-CON M) 20 MEQ tablet Take 2 tablets (40 mEq total) by mouth 2 (two) times daily.  Marland Kitchen albuterol (VENTOLIN HFA) 108 (90 Base) MCG/ACT inhaler Inhale 2 puffs into the lungs every 4 (four) hours as needed for wheezing or shortness of breath (cough,  shortness of breath or wheezing.).  Marland Kitchen budesonide-formoterol (SYMBICORT) 80-4.5 MCG/ACT inhaler Inhale 2 puffs into the lungs 2 (two) times daily.  . carvedilol (COREG) 25 MG tablet Take 1 tablet (25 mg total) by mouth 2 (two) times daily with a meal.  . famotidine (PEPCID) 20 MG tablet Take 1 tablet (20 mg total) by mouth 2 (two) times daily.  Marland Kitchen gabapentin (NEURONTIN) 600 MG tablet Take 1 tablet (600 mg total) by mouth 2 (two) times daily.  .  hydrALAZINE (APRESOLINE) 25 MG tablet Take 1 tablet (25 mg total) by mouth 3 (three) times daily.  . hydrochlorothiazide (HYDRODIURIL) 25 MG tablet Take 1 tablet (25 mg total) by mouth daily.  . [DISCONTINUED] furosemide (LASIX) 20 MG tablet Take 1 tablet (20 mg total) by mouth daily. (Patient not taking: Reported on 10/04/2022)  . [DISCONTINUED] hydrALAZINE (APRESOLINE) 25 MG tablet Take 1 tablet (25 mg total) by mouth 3 (three) times daily. (Patient not taking: Reported on 04/05/2023)  . [DISCONTINUED] HYDROcodone-acetaminophen (NORCO) 7.5-325 MG tablet Take 1-2 tablets by mouth 3 (three) times daily as needed for moderate pain. (Patient not taking: Reported on 04/05/2023)  . [DISCONTINUED] lidocaine (LIDODERM) 5 % Place 1 patch onto the skin daily. Remove & Discard patch within 12 hours or as directed by MD (Patient not taking: Reported on 10/04/2022)  . [DISCONTINUED] methocarbamol (ROBAXIN) 500 MG tablet Take 1 tablet (500 mg total) by mouth 2 (two) times daily. (Patient not taking: Reported on 04/05/2023)   No facility-administered encounter medications on file as of 04/05/2023.    Review of Systems  Review of Systems  Constitutional: Negative.   HENT: Negative.    Cardiovascular: Negative.   Gastrointestinal: Negative.   Allergic/Immunologic: Negative.   Neurological: Negative.   Psychiatric/Behavioral: Negative.       Objective:   BP (!) 158/101   Pulse 74   Temp (!) 97.5 F (36.4 C)   Wt 123 lb (55.8 kg)   SpO2 100%   BMI 21.11 kg/m   Wt Readings from Last 5 Encounters:  04/05/23 123 lb (55.8 kg)  10/04/22 117 lb 12.8 oz (53.4 kg)  07/05/22 114 lb (51.7 kg)  12/28/21 112 lb 14 oz (51.2 kg)  12/19/21 115 lb (52.2 kg)     Physical Exam Vitals and nursing note reviewed.  Constitutional:      General: She is not in acute distress.    Appearance: She is well-developed.  Cardiovascular:     Rate and Rhythm: Normal rate and regular rhythm.  Pulmonary:     Effort:  Pulmonary effort is normal.     Breath sounds: Normal breath sounds.  Neurological:     Mental Status: She is alert and oriented to person, place, and time.      Assessment & Plan:   Primary hypertension -     CBC -     Basic metabolic panel -     Carvedilol; Take 1 tablet (25 mg total) by mouth 2 (two) times daily with a meal.  Dispense: 60 tablet; Refill: 1 -     Budesonide-Formoterol Fumarate; Inhale 2 puffs into the lungs 2 (two) times daily.  Dispense: 1 each; Refill: 11  Lipid screening -     Lipid panel  Gastroesophageal reflux disease without esophagitis -     Famotidine; Take 1 tablet (20 mg total) by mouth 2 (two) times daily.  Dispense: 60 tablet; Refill: 0  Bilateral hip pain -     Gabapentin; Take 1 tablet (600 mg  total) by mouth 2 (two) times daily.  Dispense: 60 tablet; Refill: 1  Chronic congestive heart failure, unspecified heart failure type (HCC) -     Carvedilol; Take 1 tablet (25 mg total) by mouth 2 (two) times daily with a meal.  Dispense: 60 tablet; Refill: 1  Essential hypertension -     hydroCHLOROthiazide; Take 1 tablet (25 mg total) by mouth daily.  Dispense: 30 tablet; Refill: 1  Mild intermittent asthma without complication -     Budesonide-Formoterol Fumarate; Inhale 2 puffs into the lungs 2 (two) times daily.  Dispense: 1 each; Refill: 11  Other orders -     hydrALAZINE HCl; Take 1 tablet (25 mg total) by mouth 3 (three) times daily.  Dispense: 90 tablet; Refill: 0 -     Albuterol Sulfate HFA; Inhale 2 puffs into the lungs every 4 (four) hours as needed for wheezing or shortness of breath (cough, shortness of breath or wheezing.).  Dispense: 18 g; Refill: 11     Return in about 6 months (around 10/04/2023).   Ivonne Andrew, NP 04/05/2023

## 2023-04-06 LAB — BASIC METABOLIC PANEL
BUN/Creatinine Ratio: 18 (ref 9–23)
BUN: 14 mg/dL (ref 6–24)
CO2: 19 mmol/L — ABNORMAL LOW (ref 20–29)
Calcium: 9.2 mg/dL (ref 8.7–10.2)
Chloride: 106 mmol/L (ref 96–106)
Creatinine, Ser: 0.76 mg/dL (ref 0.57–1.00)
Glucose: 89 mg/dL (ref 70–99)
Potassium: 4.7 mmol/L (ref 3.5–5.2)
Sodium: 139 mmol/L (ref 134–144)
eGFR: 98 mL/min/{1.73_m2} (ref >59)

## 2023-04-06 LAB — CBC
Hematocrit: 39.2 % (ref 34.0–46.6)
Hemoglobin: 12 g/dL (ref 11.1–15.9)
MCH: 28.8 pg (ref 26.6–33.0)
MCHC: 30.6 g/dL — ABNORMAL LOW (ref 31.5–35.7)
MCV: 94 fL (ref 79–97)
Platelets: 298 10*3/uL (ref 150–450)
RBC: 4.17 x10E6/uL (ref 3.77–5.28)
RDW: 13.8 % (ref 11.7–15.4)
WBC: 6.4 10*3/uL (ref 3.4–10.8)

## 2023-04-06 LAB — LIPID PANEL
Chol/HDL Ratio: 2.5 {ratio} (ref 0.0–4.4)
Cholesterol, Total: 202 mg/dL — ABNORMAL HIGH (ref 100–199)
HDL: 81 mg/dL (ref >39)
LDL Chol Calc (NIH): 105 mg/dL — ABNORMAL HIGH (ref 0–99)
Triglycerides: 88 mg/dL (ref 0–149)
VLDL Cholesterol Cal: 16 mg/dL (ref 5–40)

## 2023-10-04 ENCOUNTER — Telehealth: Payer: Self-pay | Admitting: Nurse Practitioner

## 2023-10-04 ENCOUNTER — Ambulatory Visit: Payer: Self-pay | Admitting: Nurse Practitioner

## 2023-10-04 NOTE — Telephone Encounter (Signed)
 Attempted to contact patient for a missed appointment

## 2023-12-29 ENCOUNTER — Emergency Department (HOSPITAL_COMMUNITY)
Admission: EM | Admit: 2023-12-29 | Discharge: 2023-12-29 | Disposition: A | Discharge status: Home / Self Care | Attending: Emergency Medicine | Admitting: Emergency Medicine

## 2023-12-29 ENCOUNTER — Emergency Department (HOSPITAL_COMMUNITY)

## 2023-12-29 ENCOUNTER — Other Ambulatory Visit: Payer: Self-pay

## 2023-12-29 DIAGNOSIS — I509 Heart failure, unspecified: Secondary | ICD-10-CM | POA: Insufficient documentation

## 2023-12-29 DIAGNOSIS — I110 Hypertensive heart disease with heart failure: Secondary | ICD-10-CM | POA: Insufficient documentation

## 2023-12-29 DIAGNOSIS — Z79899 Other long term (current) drug therapy: Secondary | ICD-10-CM | POA: Insufficient documentation

## 2023-12-29 DIAGNOSIS — R079 Chest pain, unspecified: Secondary | ICD-10-CM | POA: Insufficient documentation

## 2023-12-29 LAB — COMPREHENSIVE METABOLIC PANEL WITH GFR
ALT: 103 U/L — ABNORMAL HIGH (ref 0–44)
AST: 71 U/L — ABNORMAL HIGH (ref 15–41)
Albumin: 3.6 g/dL (ref 3.5–5.0)
Alkaline Phosphatase: 72 U/L (ref 38–126)
Anion gap: 12 (ref 5–15)
BUN: 11 mg/dL (ref 6–20)
CO2: 16 mmol/L — ABNORMAL LOW (ref 22–32)
Calcium: 9.2 mg/dL (ref 8.9–10.3)
Chloride: 110 mmol/L (ref 98–111)
Creatinine, Ser: 0.65 mg/dL (ref 0.44–1.00)
GFR, Estimated: >60 mL/min (ref >60)
Glucose, Bld: 105 mg/dL — ABNORMAL HIGH (ref 70–99)
Potassium: 4 mmol/L (ref 3.5–5.1)
Sodium: 138 mmol/L (ref 135–145)
Total Bilirubin: 0.5 mg/dL (ref 0.0–1.2)
Total Protein: 7.1 g/dL (ref 6.5–8.1)

## 2023-12-29 LAB — CBC WITH DIFFERENTIAL/PLATELET
Abs Immature Granulocytes: 0.04 10*3/uL (ref 0.00–0.07)
Basophils Absolute: 0.1 10*3/uL (ref 0.0–0.1)
Basophils Relative: 1 %
Eosinophils Absolute: 0.2 10*3/uL (ref 0.0–0.5)
Eosinophils Relative: 2 %
HCT: 36.5 % (ref 36.0–46.0)
Hemoglobin: 11.8 g/dL — ABNORMAL LOW (ref 12.0–15.0)
Immature Granulocytes: 0 %
Lymphocytes Relative: 17 %
Lymphs Abs: 1.5 10*3/uL (ref 0.7–4.0)
MCH: 28.1 pg (ref 26.0–34.0)
MCHC: 32.3 g/dL (ref 30.0–36.0)
MCV: 86.9 fL (ref 80.0–100.0)
Monocytes Absolute: 0.5 10*3/uL (ref 0.1–1.0)
Monocytes Relative: 6 %
Neutro Abs: 6.8 10*3/uL (ref 1.7–7.7)
Neutrophils Relative %: 74 %
Platelets: 273 10*3/uL (ref 150–400)
RBC: 4.2 MIL/uL (ref 3.87–5.11)
RDW: 15.7 % — ABNORMAL HIGH (ref 11.5–15.5)
WBC: 9.2 10*3/uL (ref 4.0–10.5)
nRBC: 0 % (ref 0.0–0.2)

## 2023-12-29 LAB — BRAIN NATRIURETIC PEPTIDE: B Natriuretic Peptide: 27.3 pg/mL (ref 0.0–100.0)

## 2023-12-29 LAB — TROPONIN I (HIGH SENSITIVITY)
Troponin I (High Sensitivity): 8 ng/L (ref <18)
Troponin I (High Sensitivity): 8 ng/L (ref <18)

## 2023-12-29 LAB — MAGNESIUM: Magnesium: 1.8 mg/dL (ref 1.7–2.4)

## 2023-12-29 LAB — HCG, SERUM, QUALITATIVE: Preg, Serum: NEGATIVE

## 2023-12-29 MED ORDER — MORPHINE SULFATE (PF) 4 MG/ML IV SOLN
4.0000 mg | Freq: Once | INTRAVENOUS | Status: AC
  Administered 2023-12-29: 4 mg via INTRAVENOUS
  Filled 2023-12-29: qty 1

## 2023-12-29 MED ORDER — KETOROLAC TROMETHAMINE 30 MG/ML IJ SOLN
15.0000 mg | Freq: Once | INTRAMUSCULAR | Status: AC
Start: 2023-12-29 — End: 2023-12-29
  Administered 2023-12-29: 15 mg via INTRAVENOUS
  Filled 2023-12-29: qty 1

## 2023-12-29 MED ORDER — ONDANSETRON HCL 4 MG/2ML IJ SOLN
4.0000 mg | Freq: Once | INTRAMUSCULAR | Status: AC
  Administered 2023-12-29: 4 mg via INTRAVENOUS
  Filled 2023-12-29: qty 2

## 2023-12-29 NOTE — ED Provider Triage Note (Signed)
 Emergency Medicine Provider Triage Evaluation Note  [name] , a 48 y.o. female  was evaluated in triage.  Pt complains of chest pain that started this morning while preparing her children's breakfast.  Associated shortness of breath and nausea.  Fire department gave 324 aspirin .  EMS gave 1 dose of nitro which gave her headache but did not change chest pain  Review of Systems  Positive: As above Negative: As above  Physical Exam  BP (!) 142/95 (BP Location: Right Arm)   Pulse 74   Temp 98.5 F (36.9 C) (Oral)   Resp 18   Ht 5' 4 (1.626 m)   Wt 55.8 kg   LMP 11/25/2023   SpO2 100%   BMI 21.12 kg/m  Gen:   Awake, no distress   Resp:  Normal effort  MSK:   Moves extremities without difficulty  Other:    Medical Decision Making  Medically screening exam initiated at 12:19 PM.  Appropriate orders placed.  [name] was informed that the remainder of the evaluation will be completed by another provider, this initial triage assessment does not replace that evaluation, and the importance of remaining in the ED until their evaluation is complete.     [name] LABOR, DO 12/29/23 1220

## 2023-12-29 NOTE — ED Notes (Signed)
..  The patient is A&OX4, wheeled out of ED via wheelchair, NAD. Pt verbalized understanding of d/c instructions and follow up care.

## 2023-12-29 NOTE — ED Notes (Signed)
 CCMD called. Pt on monitor

## 2023-12-29 NOTE — Discharge Instructions (Signed)
 You were seen in the emerged part for chest pain Your blood work EKG chest x-ray looked okay The blood work did show an elevation in your liver function test which may be related to alcohol use Please call the number listed in the resources for help with your drinking It is important that you follow-up with your primary care doctor within the next week to make an appointment to be seen in the office and to have all your medications refilled You also need to follow-up with your cardiologist we have not seen since 2021 Dr. Delford Return to the emerged from for chest pain trouble reading or any concerns

## 2023-12-29 NOTE — ED Triage Notes (Signed)
 Pt here via [facility] for sternal chest pain x 1 week that radiates to L chest and increases with movement.  Given nitroglycerin  en-route without relief.

## 2023-12-29 NOTE — ED Provider Notes (Signed)
 [name] AT [facility] Provider Note   CSN: [id number] Arrival date & time: 12/29/23  1203     Patient presents with: Chest Pain   [name] is a 48 y.o. female.  With a history of heart failure and hypertension who presents the ED for chest pain.  Patient is experienced about 1 week of substernal chest pain with radiation to the left side.  Made worse with movement.  Some associated shortness of breath and nausea.  Received 324 aspirin  prior to arrival along with 1 dose of nitro which gave her headache but did not change chest pain.  No fevers chills vomiting diarrhea or recent illness.  Patient mentions that she has not seen her PCP or cardiologist in a very long time    Chest Pain      Prior to Admission medications   Medication Sig Start Date End Date Taking? Authorizing Provider  albuterol  (VENTOLIN  HFA) 108 (90 Base) MCG/ACT inhaler Inhale 2 puffs into the lungs every 4 (four) hours as needed for wheezing or shortness of breath (cough, shortness of breath or wheezing.). 04/05/23   Oley Bascom RAMAN, NP  budesonide -formoterol  (SYMBICORT ) 80-4.5 MCG/ACT inhaler Inhale 2 puffs into the lungs 2 (two) times daily. 04/05/23   Oley Bascom RAMAN, NP  buPROPion  (WELLBUTRIN  SR) 150 MG 12 hr tablet Take 1 tablet (150 mg total) by mouth 2 (two) times daily. 12/19/21   Oley Bascom RAMAN, NP  carvedilol  (COREG ) 25 MG tablet Take 1 tablet (25 mg total) by mouth 2 (two) times daily with a meal. 04/05/23 06/04/23  Oley Bascom RAMAN, NP  cyclobenzaprine  (FLEXERIL ) 10 MG tablet Take 1 tablet (10 mg total) by mouth 2 (two) times daily as needed for muscle spasms. 10/04/22   Oley Bascom RAMAN, NP  diazepam  (VALIUM ) 2 MG tablet Take one pill one hour prior to mri and then repeat just prior to mri if needed 04/22/21   Jule Ronal CROME, PA-C  DULoxetine  (CYMBALTA ) 60 MG capsule Take 1 capsule (60 mg total) by mouth daily. 07/05/22   Oley Bascom RAMAN, NP  famotidine  (PEPCID ) 20 MG  tablet Take 1 tablet (20 mg total) by mouth 2 (two) times daily. 04/05/23 05/05/23  Oley Bascom RAMAN, NP  gabapentin  (NEURONTIN ) 600 MG tablet Take 1 tablet (600 mg total) by mouth 2 (two) times daily. 04/05/23 06/04/23  Oley Bascom RAMAN, NP  hydrALAZINE  (APRESOLINE ) 25 MG tablet Take 1 tablet (25 mg total) by mouth 3 (three) times daily. 04/05/23 05/05/23  Oley Bascom RAMAN, NP  hydrochlorothiazide  (HYDRODIURIL ) 25 MG tablet Take 1 tablet (25 mg total) by mouth daily. 04/05/23 06/04/23  Oley Bascom RAMAN, NP  ibuprofen  (ADVIL ) 800 MG tablet Take 1 tablet (800 mg total) by mouth every 8 (eight) hours as needed. 07/21/19   Stroud, Natalie M, FNP  nitroGLYCERIN  (NITROSTAT ) 0.3 MG SL tablet Place 1 tablet (0.3 mg total) under the tongue every 5 (five) minutes as needed for chest pain. 07/05/22   Nichols, Tonya S, NP  RESTASIS  0.05 % ophthalmic emulsion Place 1 drop into both eyes as directed. 11/13/18   Stroud, Natalie M, FNP    Allergies: Patient has no known allergies.    Review of Systems  Cardiovascular:  Positive for chest pain.    Updated Vital Signs BP (!) 157/88   Pulse (!) 51   Temp 98.5 F (36.9 C) (Oral)   Resp 16   Ht 5' 4 (1.626 m)   Wt 55.8 kg  LMP 11/25/2023   SpO2 100%   BMI 21.12 kg/m   Physical Exam Vitals and nursing note reviewed.  HENT:     Head: Normocephalic and atraumatic.   Eyes:     Pupils: Pupils are equal, round, and reactive to light.    Cardiovascular:     Rate and Rhythm: Normal rate and regular rhythm.  Pulmonary:     Effort: Pulmonary effort is normal.     Breath sounds: Normal breath sounds.  Abdominal:     Palpations: Abdomen is soft.     Tenderness: There is no abdominal tenderness.   Skin:    General: Skin is warm and dry.   Neurological:     Mental Status: She is alert.   Psychiatric:        Mood and Affect: Mood normal.     (all labs ordered are listed, but only abnormal results are displayed) Labs Reviewed  CBC WITH  DIFFERENTIAL/PLATELET - Abnormal; Notable for the following components:      Result Value   Hemoglobin 11.8 (*)    RDW 15.7 (*)    All other components within normal limits  COMPREHENSIVE METABOLIC PANEL WITH GFR - Abnormal; Notable for the following components:   CO2 16 (*)    Glucose, Bld 105 (*)    AST 71 (*)    ALT 103 (*)    All other components within normal limits  BRAIN NATRIURETIC PEPTIDE  MAGNESIUM   HCG, SERUM, QUALITATIVE  TROPONIN I (HIGH SENSITIVITY)  TROPONIN I (HIGH SENSITIVITY)    EKG: EKG Interpretation Date/Time:  Saturday December 29 2023 12:16:40 EDT Ventricular Rate:  71 PR Interval:  142 QRS Duration:  92 QT Interval:  430 QTC Calculation: 467 R Axis:   -8  Text Interpretation: Normal sinus rhythm Minimal voltage criteria for LVH, may be normal variant ( Cornell product ) Borderline ECG When compared with ECG of 28-Dec-2021 11:56, PREVIOUS ECG IS PRESENT Confirmed by Pamella Sharper [phone]) on 12/29/2023 2:29:07 PM  Radiology: DG Chest Portable 1 View Result Date: 12/29/2023 CLINICAL DATA:  Chest pain EXAM: PORTABLE CHEST - 1 VIEW COMPARISON:  02/04/2021 FINDINGS: Lungs are clear.  No pneumothorax. Heart size upper limits normal for technique. No effusion. Visualized bones unremarkable. IMPRESSION: No acute cardiopulmonary disease. Electronically Signed   By: JONETTA Faes M.D.   On: 12/29/2023 12:48     Ultrasound ED Echo  Date/Time: 12/29/2023 1:46 PM  Performed by: Pamella Sharper LABOR, DO Authorized by: Pamella Sharper LABOR, DO   Procedure details:    Indications: chest pain     Views: subxiphoid, parasternal long axis view, parasternal short axis view, apical 4 chamber view and IVC view     Images: archived   Findings:    Pericardium: no pericardial effusion     LV Function: depressed (30 - 50%)     RV Diameter: normal     IVC: dilated   Impression:    Impression comment:  Estimated EF of 45% with dilation of IVC consistent with known history of heart  failure    Medications Ordered in the ED  ondansetron  (ZOFRAN ) injection 4 mg (4 mg Intravenous Given 12/29/23 1340)  morphine  (PF) 4 MG/ML injection 4 mg (4 mg Intravenous Given 12/29/23 1340)  ketorolac  (TORADOL ) 30 MG/ML injection 15 mg (15 mg Intravenous Given 12/29/23 1435)    Clinical Course as of 12/29/23 1610  Sat Dec 29, 2023  1347 Bedside echo completed.  Slightly decreased EF estimated about 45%.  Plethoric  IVC with minimal respiratory variation.  Patient mains stable on room air.  Chest pain is reproducible with my palpation during bedside echo [MP]  1608 Delta troponin flat.  Low suspicion for ACS.  No significant elevation in BNP.  Pregnancy negative.  Laboratory workup notable for bump in liver function test.  Patient does admit to alcohol use.  Will provide outpatient resource for this.  Chest x-ray looks clear.  Chest pain has improved after 1 dose of morphine  1 dose of Toradol .  She needs to follow-up with her primary care doctor to discuss refills of all of her medications as well as her previous cardiologist.  Return precautions discussed with her and her family detail. [MP]    Clinical Course User Index [MP] Pamella Ozell LABOR, DO                                 Medical Decision Making 48 year old female with history as above presented to the ED for 1 week of left-sided chest pain.  Pain made worse with movement.  Afebrile slightly hypertensive.  Uncomfortable appearing on exam with some active chest pain.  Unchanged with nitro from EMS.  Received full dose of aspirin  prior to arrival.  Benign physical exam.  Differential diagnose includes ACS, heart failure exacerbation, dysrhythmia, pericardial effusion, pericarditis, myocarditis pneumonia, viral respiratory illness and musculoskeletal etiology.  Low suspicion for PE.  PERC negative.  Will obtain laboratory work including positive troponin, BNP, metabolic panel, CBC, HCG, chest x-ray EKG and continue to monitor.  Will provide  morphine  for pain and Zofran  for nausea vomiting and reassess  Amount and/or Complexity of Data Reviewed Labs: ordered. Radiology: ordered.  Risk Prescription drug management.        Final diagnoses:  Chest pain, unspecified type    ED Discharge Orders     None          Pamella Ozell LABOR, DO 12/29/23 1610

## 2023-12-31 ENCOUNTER — Telehealth: Payer: Self-pay

## 2024-01-01 NOTE — Telephone Encounter (Signed)
 Error

## 2024-03-14 ENCOUNTER — Ambulatory Visit: Payer: Self-pay | Admitting: Nurse Practitioner
